# Patient Record
Sex: Female | Born: 1935 | Race: White | Hispanic: No | Smoking: Never smoker
Health system: Southern US, Community
[De-identification: ages and names within clinical notes are randomized; demographics above are authoritative.]

## PROBLEM LIST (undated history)

## (undated) DIAGNOSIS — M1990 Unspecified osteoarthritis, unspecified site: Secondary | ICD-10-CM

## (undated) DIAGNOSIS — E042 Nontoxic multinodular goiter: Secondary | ICD-10-CM

## (undated) DIAGNOSIS — I10 Essential (primary) hypertension: Secondary | ICD-10-CM

## (undated) DIAGNOSIS — E119 Type 2 diabetes mellitus without complications: Secondary | ICD-10-CM

## (undated) DIAGNOSIS — Z8673 Personal history of transient ischemic attack (TIA), and cerebral infarction without residual deficits: Secondary | ICD-10-CM

## (undated) DIAGNOSIS — C50919 Malignant neoplasm of unspecified site of unspecified female breast: Secondary | ICD-10-CM

## (undated) HISTORY — PX: BREAST BIOPSY: SHX20

## (undated) HISTORY — DX: Essential (primary) hypertension: I10

## (undated) HISTORY — DX: Malignant neoplasm of unspecified site of unspecified female breast: C50.919

## (undated) HISTORY — DX: Personal history of transient ischemic attack (TIA), and cerebral infarction without residual deficits: Z86.73

## (undated) HISTORY — DX: Unspecified osteoarthritis, unspecified site: M19.90

## (undated) HISTORY — PX: BIOPSY THYROID: PRO38

## (undated) HISTORY — DX: Type 2 diabetes mellitus without complications: E11.9

## (undated) HISTORY — DX: Nontoxic multinodular goiter: E04.2

## (undated) MED FILL — Dexamethasone Sodium Phosphate Inj 100 MG/10ML: INTRAMUSCULAR | Qty: 1 | Status: AC

## (undated) MED FILL — Heparin Sodium (Porcine) Lock Flush IV Soln 100 Unit/ML: INTRAVENOUS | Qty: 5 | Status: AC

## (undated) MED FILL — Sodium Chloride IV Soln 0.9%: INTRAVENOUS | Qty: 250 | Status: AC

---

## 2009-02-11 ENCOUNTER — Encounter: Admission: RE | Admit: 2009-02-11 | Discharge: 2009-02-11 | Payer: Self-pay | Admitting: Endocrinology

## 2014-01-30 DIAGNOSIS — M353 Polymyalgia rheumatica: Secondary | ICD-10-CM | POA: Diagnosis not present

## 2014-01-30 DIAGNOSIS — I10 Essential (primary) hypertension: Secondary | ICD-10-CM | POA: Diagnosis not present

## 2014-01-30 DIAGNOSIS — E1165 Type 2 diabetes mellitus with hyperglycemia: Secondary | ICD-10-CM | POA: Diagnosis not present

## 2014-01-30 DIAGNOSIS — E1169 Type 2 diabetes mellitus with other specified complication: Secondary | ICD-10-CM | POA: Diagnosis not present

## 2014-01-30 DIAGNOSIS — E780 Pure hypercholesterolemia: Secondary | ICD-10-CM | POA: Diagnosis not present

## 2014-03-25 DIAGNOSIS — M810 Age-related osteoporosis without current pathological fracture: Secondary | ICD-10-CM | POA: Diagnosis not present

## 2014-05-05 DIAGNOSIS — H2513 Age-related nuclear cataract, bilateral: Secondary | ICD-10-CM | POA: Diagnosis not present

## 2014-08-07 DIAGNOSIS — Z79899 Other long term (current) drug therapy: Secondary | ICD-10-CM | POA: Diagnosis not present

## 2014-08-07 DIAGNOSIS — Z9181 History of falling: Secondary | ICD-10-CM | POA: Diagnosis not present

## 2014-08-07 DIAGNOSIS — E780 Pure hypercholesterolemia: Secondary | ICD-10-CM | POA: Diagnosis not present

## 2014-08-07 DIAGNOSIS — Z0000 Encounter for general adult medical examination without abnormal findings: Secondary | ICD-10-CM | POA: Diagnosis not present

## 2014-08-07 DIAGNOSIS — Z1389 Encounter for screening for other disorder: Secondary | ICD-10-CM | POA: Diagnosis not present

## 2019-09-13 DIAGNOSIS — I340 Nonrheumatic mitral (valve) insufficiency: Secondary | ICD-10-CM

## 2019-09-13 DIAGNOSIS — I361 Nonrheumatic tricuspid (valve) insufficiency: Secondary | ICD-10-CM

## 2019-09-13 DIAGNOSIS — I6389 Other cerebral infarction: Secondary | ICD-10-CM

## 2020-01-03 DIAGNOSIS — Z171 Estrogen receptor negative status [ER-]: Secondary | ICD-10-CM | POA: Insufficient documentation

## 2020-01-08 ENCOUNTER — Telehealth: Payer: Self-pay | Admitting: Oncology

## 2020-01-08 ENCOUNTER — Other Ambulatory Visit: Payer: Self-pay | Admitting: Oncology

## 2020-01-08 DIAGNOSIS — Z170 Estrogen receptor positive status [ER+]: Secondary | ICD-10-CM | POA: Insufficient documentation

## 2020-01-08 DIAGNOSIS — C50411 Malignant neoplasm of upper-outer quadrant of right female breast: Secondary | ICD-10-CM | POA: Insufficient documentation

## 2020-01-08 DIAGNOSIS — C50911 Malignant neoplasm of unspecified site of right female breast: Secondary | ICD-10-CM | POA: Insufficient documentation

## 2020-01-08 NOTE — Telephone Encounter (Signed)
Patient referred by Dr Orrin Brigham for Rt Breast CA.  Appt Made for 01/09/20 Labs 2:30 pm - Consult 3:00 pm.  Patient notified

## 2020-01-09 ENCOUNTER — Inpatient Hospital Stay (INDEPENDENT_AMBULATORY_CARE_PROVIDER_SITE_OTHER): Payer: Medicare Other | Admitting: Oncology

## 2020-01-09 ENCOUNTER — Other Ambulatory Visit: Payer: Self-pay | Admitting: Oncology

## 2020-01-09 ENCOUNTER — Inpatient Hospital Stay: Payer: Medicare Other | Attending: Oncology

## 2020-01-09 ENCOUNTER — Other Ambulatory Visit: Payer: Self-pay

## 2020-01-09 ENCOUNTER — Other Ambulatory Visit: Payer: Self-pay | Admitting: Hematology and Oncology

## 2020-01-09 ENCOUNTER — Encounter: Payer: Self-pay | Admitting: Oncology

## 2020-01-09 DIAGNOSIS — C50411 Malignant neoplasm of upper-outer quadrant of right female breast: Secondary | ICD-10-CM | POA: Diagnosis not present

## 2020-01-09 DIAGNOSIS — Z170 Estrogen receptor positive status [ER+]: Secondary | ICD-10-CM

## 2020-01-09 LAB — HEPATIC FUNCTION PANEL
ALT: 23 (ref 7–35)
AST: 25 (ref 13–35)
Alkaline Phosphatase: 87 (ref 25–125)
Bilirubin, Total: 1.3

## 2020-01-09 LAB — CBC AND DIFFERENTIAL
HCT: 40 (ref 36–46)
Hemoglobin: 13 (ref 12.0–16.0)
Neutrophils Absolute: 4.05
Platelets: 192 (ref 150–399)
WBC: 5.7

## 2020-01-09 LAB — CBC: RBC: 4.16 (ref 3.87–5.11)

## 2020-01-09 LAB — BASIC METABOLIC PANEL
BUN: 16 (ref 4–21)
CO2: 26 — AB (ref 13–22)
Chloride: 102 (ref 99–108)
Creatinine: 0.8 (ref 0.5–1.1)
Glucose: 145
Potassium: 4.7 (ref 3.4–5.3)
Sodium: 140 (ref 137–147)

## 2020-01-09 LAB — COMPREHENSIVE METABOLIC PANEL
Albumin: 4.2 (ref 3.5–5.0)
Calcium: 10.5 (ref 8.7–10.7)

## 2020-01-09 NOTE — Progress Notes (Signed)
echo

## 2020-01-09 NOTE — Progress Notes (Signed)
[facility]  [address] [place],  [place]  [address] [phone]  Clinic Day:  01/09/2020  Referring physician: No ref. provider found   This document serves as a record of services personally performed by Hosie Poisson, MD. It was created on their behalf by Curry,Lauren E, a trained medical scribe. Brandy creation of this record is based on Brandy scribe's personal observations and Brandy provider's statements to them.   CHIEF COMPLAINT:  CC: Invasive ductal carcinoma of Brandy right breast  Current Treatment:  Will plan for neoadjuvant therapy   HISTORY OF PRESENT ILLNESS:  Brandy Jacobs is a 86 y.o. female referred by Dr. Orrin Brigham for Brandy evaluation and treatment of invasive ductal carcinoma of Brandy right breast.  This began when Brandy Jacobs felt a lump of Brandy right breast, which quickly enlarged.  Diagnostic mammogram confirmed a highly suspicious 2.9 cm upper right breast mass.  No abnormal right axillary lymph nodes were seen and left breast was negative.  Ultrasound guided biopsy was pursued and surgical pathology from that procedure confirmed invasive ductal carcinoma, grade 2/3, and DCIS with central necrosis.  HER2 was positive, estrogen receptor was positive at 95% and progesterone receptor was positive at 30%.  Ki67 was 40%.  INTERVAL HISTORY:  Brandy Jacobs states that Brandy Jacobs had a stroke back in late August, and still has some difficulty finding Brandy right words and if Brandy Jacobs gets excited, Brandy Jacobs will become forgetful.  Brandy Jacobs also has several nodules of Brandy thyroid, but biopsy was benign in November.  Brandy Jacobs does have diabetes, but is not on medication.  Brandy Jacobs does have a significant family history, but does think anyone has had genetic testing.  Blood counts and chemistries are unremarkable except for a calcium of 10.5.  Non-fasting blood glucose is 145.  Brandy Jacobs  appetite is good, and Brandy Jacobs is eating well.  Brandy Jacobs denies any significant weight loss or gain.  Brandy Jacobs denies fever,  chills or other signs of infection.  Brandy Jacobs denies nausea, vomiting, bowel issues, or abdominal pain.  Brandy Jacobs denies sore throat, cough, dyspnea, or chest pain.  Brandy Jacobs started menarche at age 34.  Brandy Jacobs has had 2 pregnancies, and had Brandy Jacobs first child at age 33.  Brandy Jacobs went through menopause in Brandy Jacobs late 86s and never took hormone replacement.  REVIEW OF SYSTEMS:  Review of Systems  Constitutional: Negative.   HENT:  Negative.   Eyes: Negative.   Respiratory: Negative.   Cardiovascular: Negative.   Gastrointestinal: Negative.   Endocrine: Negative.   Genitourinary: Negative.    Musculoskeletal: Negative.   Skin: Negative.   Neurological: Positive for speech difficulty (mild, residual from Brandy Jacobs prior stroke).       Occasional forgetfulness  Hematological: Negative.   Psychiatric/Behavioral: Negative.      VITALS:  Blood pressure (!) 158/69, pulse 77, temperature 98.1 F (36.7 C), temperature source Oral, resp. rate 18, height 5' 3.5" (1.613 m), weight 160 lb 8 oz (72.8 kg), SpO2 96 %.  Wt Readings from Last 3 Encounters:  01/09/20 160 lb 8 oz (72.8 kg)    Body mass index is 27.99 kg/m.  Performance status (ECOG): 1 - Symptomatic but completely ambulatory  PHYSICAL EXAM:  Physical Exam Constitutional:      General: Brandy Jacobs is not in acute distress.    Appearance: Normal appearance. Brandy Jacobs is normal weight.  HENT:     Head: Normocephalic and atraumatic.  Eyes:     General: No scleral icterus.    Extraocular  Movements: Extraocular movements intact.     Conjunctiva/sclera: Conjunctivae normal.     Pupils: Pupils are equal, round, and reactive to light.  Cardiovascular:     Rate and Rhythm: Normal rate and regular rhythm.     Pulses: Normal pulses.     Heart sounds: Normal heart sounds. No murmur heard. No friction rub. No gallop.   Pulmonary:     Effort: Pulmonary effort is normal. No respiratory distress.     Breath sounds: Normal breath sounds.  Chest:     Comments: I can feel a mass in Brandy  upper outer quadrant of Brandy right breast measuring 3-4 cm.  Abdominal:     General: Bowel sounds are normal. There is no distension.     Palpations: Abdomen is soft. There is no mass.     Tenderness: There is no abdominal tenderness.  Musculoskeletal:        General: Normal range of motion.     Cervical back: Normal range of motion and neck supple.     Right lower leg: Edema (trace) present.     Left lower leg: Edema (trace) present.  Lymphadenopathy:     Cervical: No cervical adenopathy.  Skin:    General: Skin is warm and dry.  Neurological:     General: No focal deficit present.     Mental Status: Brandy Jacobs is alert and oriented to person, place, and time. Mental status is at baseline.  Psychiatric:        Mood and Affect: Mood normal.        Behavior: Behavior normal.        Thought Content: Thought content normal.        Judgment: Judgment normal.     LABS:   CBC Latest Ref Rng & Units 01/09/2020  WBC - 5.7  Hemoglobin 12.0 - 16.0 13.0  Hematocrit 36 - 46 40  Platelets 150 - 399 192   CMP Latest Ref Rng & Units 01/09/2020  BUN 4 - 21 16  Creatinine 0.5 - 1.1 0.8  Sodium 137 - 147 140  Potassium 3.4 - 5.3 4.7  Chloride 99 - 108 102  CO2 13 - 22 26(A)  Calcium 8.7 - 10.7 10.5  Alkaline Phos 25 - 125 87  AST 13 - 35 25  ALT 7 - 35 23    STUDIES:   Brandy Jacobs underwent an MRI head without contrast on 09/14/2019 showing: 1.  Small acute infarct in Brandy left frontal cortex, MCA distribution 2.  Moderate chronic small vessel ischemia and small remote cerebellar infarcts  Brandy Jacobs underwent a thyroid ultrasound on 12/04/2019 showing: 1.  Multinodular goiter 2.  Solid nodule measuring up to 1.8 cm in Brandy mid right thyroid gland is highly suspicious for malignancy and meets criteria for tissue sampling.  Recommend ultrasound guided fine needle aspiration. 3.  Mixed cystic and solid nodulein Brandy right inferior thyroid gland is mildly suspicious for malignancy.  Recommend 1 year  ultrasound follow up. 4.  Additional scattered benign nodules throughout Brandy thyroid gland including a 1.8 cm spongiform nodule in Brandy left inferior thyroid. These nodules do not meet criteria for dedicated follow up.  Brandy Jacobs underwent an ultrasound guided fine needle aspiration of indeterminate thyroid nodule on 12/18/2019 and surgical pathology from this procedure revealed: 1.  Thyroid, fine needle aspiration, right-mid:  -  Consistent with benign follicular nodule, Bethesda category II.  Brandy Jacobs underwent digital diagnostic bilateral mammogram with tomography and right breast ultrasound on 12/26/2019 showing: breast density  category B.  Highly suspicious 2.9 cm upper right breast mass.  Tissue sampling is recommended.  No abnormal right axillary lymph nodes.  No mammographic evidence of left breast malignancy.  Brandy Jacobs underwent ultrasound guided right breast core needle biopsy on 01/01/2020 and surgical pathology from this procedure revealed: 1.  Breast, right, needle core biopsy, 11:30 oclock, 3 cmfn:  -  Invasive ductal carcinoma, grade 2/3, and DCIS with central necrosis  -  Tumor involves all cores, 8 mm in maximal linear extent HER2: Positive 3+ ER: Positive 95% PR: Positive 30% Ki67: 40%    HISTORY:   Past Medical History:  Diagnosis Date   Arthritis    osteoarthritis   Breast cancer ([facility])    Diabetes mellitus without complication ([place])    not on medication   History of CVA (cerebrovascular accident)    Hypertension    Multiple thyroid nodules    benign    Past Surgical History:  Procedure Laterality Date   BIOPSY THYROID     benign   BREAST BIOPSY      Family History  Problem Relation Age of Onset   Colon cancer Father        21s   Breast cancer Sister        30-60   Colon cancer Brother        16s   Prostate cancer Brother        late 13s   Breast cancer Half-Sister        late 79s early 28s    Social History:  reports that Brandy Jacobs has never smoked.  Brandy Jacobs has never used smokeless tobacco. Brandy Jacobs reports previous alcohol use. Brandy Jacobs reports that Brandy Jacobs does not use drugs.Brandy Jacobs is accompanied by Brandy Jacobs daughter Brandy Jacobs today.  Brandy Jacobs is widowed and lives at home alone.  Brandy Jacobs has 2 children, Brandy Jacobs daughter Brandy only one living.  Brandy Jacobs has been exposed to second hand smoke.  Allergies: Not on File  Current Medications: Current Outpatient Medications  Medication Sig Dispense Refill   acetaminophen (TYLENOL) 500 MG tablet Take 500 mg by mouth at bedtime.     aspirin EC 81 MG tablet Take 81 mg by mouth daily. Swallow whole.     atorvastatin (LIPITOR) 40 MG tablet Take 40 mg by mouth daily.     losartan (COZAAR) 50 MG tablet Take 50 mg by mouth daily.     predniSONE (DELTASONE) 5 MG tablet Take 2.5 mg by mouth 2 (two) times a week. TAKES 1/2 TABLET OF 5MG TWICE A WEEK     Vitamin D3 (VITAMIN D) 25 MCG tablet Take 1,000 Units by mouth daily.     No current facility-administered medications for this visit.     ASSESSMENT & PLAN:   Assessment:   1.  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  HER2 and hormone receptor positive.  Currently Brandy Jacobs mass measures 2.9 cm, so we discussed neoadjuvant chemotherapy options today.  2.  History of stroke in late August 2021.  Brandy Jacobs only has residual difficulty with finding Brandy correct words when speaking, and minor short term memory loss when Brandy Jacobs becomes excitable.    3.  Multiple thyroid nodules, benign on biopsy from November 2021.  4.  Chronically elevated calcium, mild.  Brandy Jacobs states that this fluctuates up and down, and Brandy Jacobs has been evaluated by an endocrinologist.  Plan: This is a fairly healthy 86 year old female recently diagnosed with stage II triple positive  invasive ductal carcinoma of Brandy right breast.  Brandy Jacobs has been seen by Dr. Lilia Pro for consultation to discuss lumpectomy versus mastectomy at this time, and he has consulted me to determine if it would be better to  pursue neoadjuvant or adjuvant therapy.  With Brandy size of Brandy Jacobs tumor, I feel neoadjuvant therapy is indicated and might allow for a better resection by lumpectomy.  Normally, we would do a pretreatment MRI but I don't know if this is necessary as Brandy lesion is easily palpable and Brandy Jacobs would have some musculoskeletal difficulties tolerating Brandy position required.  As Brandy Jacobs is HER2 positive, I do recommend chemotherapy with Taxol IV weekly for 9 doses and Herceptin IV every 3 weeks for a total of 1 year.  I would likely add in Perjeta as well as long as Brandy Jacobs cardiac function is adequate. Brandy Jacobs then could proceed with lumpectomy after completing Taxol.  We reviewed Brandy schedule and potential toxicities of treatment and would schedule Brandy Jacobs for an education session prior to Brandy Jacobs start date.  Brandy Jacobs would also need to undergo port placement, which could be scheduled through Dr. Lilia Pro.  Baseline ECHO would also need to be obtained prior to initiation.  Following Brandy completion of systemic therapy, we could initiate hormonal therapy with an aromatase inhibitor such as anastrozole for a total of 5 years.  Brandy Jacobs is in agreement to proceed with neoadjuvant therapy, so we will try and get treatment started in early January.  Brandy Jacobs and Brandy Jacobs daughter understand and agree with this plan of care.  I have answered Brandy Jacobs questions and Brandy Jacobs knows to call with any concerns.  Thank you for Brandy opportunity    I provided 60 minutes of face-to-face time during this this encounter and > 50% was spent counseling as documented under my assessment and plan.    Derwood Kaplan, MD [facility] [address] [place] [address] Dept: [phone] Dept Fax: [phone]   I, Rita Ohara, am acting as scribe for Derwood Kaplan, MD  I have reviewed this report as typed by Brandy medical scribe, and it is complete and accurate.

## 2020-01-10 ENCOUNTER — Telehealth: Payer: Self-pay | Admitting: Oncology

## 2020-01-10 ENCOUNTER — Telehealth: Payer: Self-pay

## 2020-01-10 NOTE — Telephone Encounter (Signed)
Called Pam at Dr. Maxie Barb office. Per Pam, have patient to come by office to sign papers for port placement, no appointment necessary due to patient just saw him. Patient will be called and notified of the above. Tried to call daughter Marcelline Mates notified.

## 2020-01-10 NOTE — Telephone Encounter (Signed)
Per 12/16 No LOS entered.  Do we go ahead and scheduled patient's 1st treatment on 01/28/2020

## 2020-01-10 NOTE — Telephone Encounter (Signed)
-----   Message from Derwood Kaplan, MD sent at 01/09/2020  8:19 PM EST ----- Regarding: refer Ask Dr. Lilia Pro to place port, he will be expecting call, he sent her to me

## 2020-01-10 NOTE — Telephone Encounter (Signed)
Pam from Dr. Maxie Barb office called to let us know that patient is scheduled for port placement on 01/16/20. Dr. Hinton Rao made aware.

## 2020-01-14 ENCOUNTER — Telehealth: Payer: Self-pay | Admitting: Oncology

## 2020-01-14 NOTE — Telephone Encounter (Signed)
12/21 spoke with daughter -echo sched on 01/20/20@1pm 

## 2020-01-19 ENCOUNTER — Other Ambulatory Visit: Payer: Self-pay | Admitting: Oncology

## 2020-01-19 DIAGNOSIS — Z170 Estrogen receptor positive status [ER+]: Secondary | ICD-10-CM

## 2020-01-19 DIAGNOSIS — C50411 Malignant neoplasm of upper-outer quadrant of right female breast: Secondary | ICD-10-CM

## 2020-01-20 ENCOUNTER — Telehealth: Payer: Self-pay

## 2020-01-20 DIAGNOSIS — I361 Nonrheumatic tricuspid (valve) insufficiency: Secondary | ICD-10-CM

## 2020-01-20 DIAGNOSIS — I340 Nonrheumatic mitral (valve) insufficiency: Secondary | ICD-10-CM

## 2020-01-20 NOTE — Telephone Encounter (Signed)
Brandy Jacobs from Dr. Maxie Barb office called to inform us that patient port surgery was rescheduled due to surgeon being sick. Rescheduled port placement to 01/27/2020. Dr. Hinton Rao also made aware of change.

## 2020-01-22 ENCOUNTER — Encounter: Payer: Self-pay | Admitting: Oncology

## 2020-01-22 NOTE — Progress Notes (Deleted)
[facility]  [address] [place],  [name]  [address] [phone]  Clinic Day:  01/22/2020  Referring physician: [name], MD   This document serves as a record of services personally performed by Gery Pray, MD. It was created on their behalf by [facility] E, a trained medical scribe. The creation of this record is based on the scribe's personal observations and the provider's statements to them.   CHIEF COMPLAINT:  CC: Invasive ductal carcinoma of the right breast  Current Treatment:  Will plan for neoadjuvant therapy   HISTORY OF PRESENT ILLNESS:  Brandy Jacobs is a 86 y.o. female referred by Dr. Marvis Moeller for the evaluation and treatment of invasive ductal carcinoma of the right breast.  This began when the patient felt a lump of the right breast, which quickly enlarged.  Diagnostic mammogram confirmed a highly suspicious 2.9 cm upper right breast mass.  No abnormal right axillary lymph nodes were seen and left breast was negative.  Ultrasound guided biopsy was pursued and surgical pathology from that procedure confirmed invasive ductal carcinoma, grade 2/3, and DCIS with central necrosis.  HER2 was positive, estrogen receptor was positive at 95% and progesterone receptor was positive at 30%.  Ki67 was 40%.  INTERVAL HISTORY:  Brandy Jacobs states that she had a stroke back in late August, and still has some difficulty finding the right words and if she gets excited, she will become forgetful.  She also has several nodules of the thyroid, but biopsy was benign in November.  She does have diabetes, but is not on medication.  She does have a significant family history, but does think anyone has had genetic testing.  Blood counts and chemistries are unremarkable except for a calcium of 10.5.  Non-fasting blood glucose is 145.  Her  appetite is good, and she is eating well.  She denies any significant weight loss or gain.  She denies fever, chills  or other signs of infection.  She denies nausea, vomiting, bowel issues, or abdominal pain.  She denies sore throat, cough, dyspnea, or chest pain.  She started menarche at age 64.  She has had 2 pregnancies, and had her first child at age 69.  She went through menopause in her late 17s and never took hormone replacement.  REVIEW OF SYSTEMS:  Review of Systems - Oncology   VITALS:  There were no vitals taken for this visit.  Wt Readings from Last 3 Encounters:  01/09/20 160 lb 8 oz (72.8 kg)    There is no height or weight on file to calculate BMI.  Performance status (ECOG): 1 - Symptomatic but completely ambulatory  PHYSICAL EXAM:  Physical Exam  LABS:   CBC Latest Ref Rng & Units 01/09/2020  WBC - 5.7  Hemoglobin 12.0 - 16.0 13.0  Hematocrit 36 - 46 40  Platelets 150 - 399 192   CMP Latest Ref Rng & Units 01/09/2020  BUN 4 - 21 16  Creatinine 0.5 - 1.1 0.8  Sodium 137 - 147 140  Potassium 3.4 - 5.3 4.7  Chloride 99 - 108 102  CO2 13 - 22 26(A)  Calcium 8.7 - 10.7 10.5  Alkaline Phos 25 - 125 87  AST 13 - 35 25  ALT 7 - 35 23    STUDIES:   She underwent an MRI head without contrast on 09/14/2019 showing: 1.  Small acute infarct in the left frontal cortex, MCA distribution 2.  Moderate chronic small vessel  ischemia and small remote cerebellar infarcts  She underwent a thyroid ultrasound on 12/04/2019 showing: 1.  Multinodular goiter 2.  Solid nodule measuring up to 1.8 cm in the mid right thyroid gland is highly suspicious for malignancy and meets criteria for tissue sampling.  Recommend ultrasound guided fine needle aspiration. 3.  Mixed cystic and solid nodulein the right inferior thyroid gland is mildly suspicious for malignancy.  Recommend 1 year ultrasound follow up. 4.  Additional scattered benign nodules throughout the thyroid gland including a 1.8 cm spongiform nodule in the left inferior thyroid. These nodules do not meet criteria for dedicated follow  up.  She underwent an ultrasound guided fine needle aspiration of indeterminate thyroid nodule on 12/18/2019 and surgical pathology from this procedure revealed: 1.  Thyroid, fine needle aspiration, right-mid:  -  Consistent with benign follicular nodule, Bethesda category II.  She underwent digital diagnostic bilateral mammogram with tomography and right breast ultrasound on 12/26/2019 showing: breast density category B.  Highly suspicious 2.9 cm upper right breast mass.  Tissue sampling is recommended.  No abnormal right axillary lymph nodes.  No mammographic evidence of left breast malignancy.  She underwent ultrasound guided right breast core needle biopsy on 01/01/2020 and surgical pathology from this procedure revealed: 1.  Breast, right, needle core biopsy, 11:30 o'clock, 3 cmfn:  -  Invasive ductal carcinoma, grade 2/3, and DCIS with central necrosis  -  Tumor involves all cores, 8 mm in maximal linear extent HER2: Positive 3+ ER: Positive 95% PR: Positive 30% Ki67: 40%    HISTORY:   Past Medical History:  Diagnosis Date  . Arthritis    osteoarthritis  . Breast cancer ([place])   . Diabetes mellitus without complication ([place])    not on medication  . History of CVA (cerebrovascular accident)   . Hypertension   . Multiple thyroid nodules    benign    Past Surgical History:  Procedure Laterality Date  . BIOPSY THYROID     benign  . BREAST BIOPSY      Family History  Problem Relation Age of Onset  . Colon cancer Father        61s  . Breast cancer Sister        62-60  . Colon cancer Brother        46s  . Prostate cancer Brother        late 22s  . Breast cancer Half-Sister        late 44s early 52s    Social History:  reports that she has never smoked. She has never used smokeless tobacco. She reports previous alcohol use. She reports that she does not use drugs.The patient is accompanied by her daughter Brandy Jacobs today.  She is widowed and lives at home alone.  She  has 2 children, her daughter the only one living.  She has been exposed to second hand smoke.  Allergies: No Known Allergies  Current Medications: Current Outpatient Medications  Medication Sig Dispense Refill  . acetaminophen (TYLENOL) 500 MG tablet Take 500 mg by mouth at bedtime.    Marland Kitchen aspirin EC 81 MG tablet Take 81 mg by mouth daily. Swallow whole.    Marland Kitchen atorvastatin (LIPITOR) 40 MG tablet Take 40 mg by mouth daily.    Marland Kitchen losartan (COZAAR) 50 MG tablet Take 50 mg by mouth daily.    . predniSONE (DELTASONE) 5 MG tablet Take 2.5 mg by mouth 2 (two) times a week. TAKES 1/2 TABLET OF $RemoveB'5MG'zWNopOvt$  TWICE A  WEEK    . Vitamin D3 (VITAMIN D) 25 MCG tablet Take 1,000 Units by mouth daily.     No current facility-administered medications for this visit.     ASSESSMENT & PLAN:   Assessment:   1.  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  HER2 and hormone receptor positive.  Currently her mass measures 2.9 cm, so we discussed neoadjuvant chemotherapy options today.  2.  History of stroke in late August 2021.  She only has residual difficulty with finding the correct words when speaking, and minor short term memory loss when she becomes excitable.    3.  Multiple thyroid nodules, benign on biopsy from November 2021.  4.  Chronically elevated calcium, mild.  She states that this fluctuates up and down, and she has been evaluated by an endocrinologist.  Plan: This is a fairly healthy 86 year old female recently diagnosed with stage II triple positive invasive ductal carcinoma of the right breast.  She has been seen by Dr. Lilia Pro for consultation to discuss lumpectomy versus mastectomy at this time, and he has consulted me to determine if it would be better to pursue neoadjuvant or adjuvant therapy.  With the size of her tumor, I feel neoadjuvant therapy is indicated and might allow for a better resection by lumpectomy.  Normally, we would do a pretreatment MRI but I  don't know if this is necessary as the lesion is easily palpable and she would have some musculoskeletal difficulties tolerating the position required.  As she is HER2 positive, I do recommend chemotherapy with Taxol IV weekly for 9 doses and Herceptin IV every 3 weeks for a total of 1 year.  I would likely add in Perjeta as well as long as her cardiac function is adequate. She then could proceed with lumpectomy after completing Taxol.  We reviewed the schedule and potential toxicities of treatment and would schedule her for an education session prior to her start date.  She would also need to undergo port placement, which could be scheduled through Dr. Lilia Pro.  Baseline ECHO would also need to be obtained prior to initiation.  Following the completion of systemic therapy, we could initiate hormonal therapy with an aromatase inhibitor such as anastrozole for a total of 5 years.  She is in agreement to proceed with neoadjuvant therapy, so we will try and get treatment started in early January.  She and her daughter understand and agree with this plan of care.  I have answered her questions and she knows to call with any concerns.  Thank you for the opportunity    I provided 60 minutes of face-to-face time during this this encounter and > 50% was spent counseling as documented under my assessment and plan.    Melodye Ped, NP [facility] [address] [place] [address] Dept: [phone] Dept Fax: [phone]   I, Rita Ohara, am acting as scribe for Derwood Kaplan, MD  I have reviewed this report as typed by the medical scribe, and it is complete and accurate.

## 2020-01-23 ENCOUNTER — Inpatient Hospital Stay: Payer: Medicare Other

## 2020-01-23 ENCOUNTER — Other Ambulatory Visit: Payer: Self-pay

## 2020-01-23 ENCOUNTER — Inpatient Hospital Stay: Payer: Medicare Other | Admitting: Hematology and Oncology

## 2020-01-23 ENCOUNTER — Other Ambulatory Visit: Payer: Self-pay | Admitting: Hematology and Oncology

## 2020-01-23 ENCOUNTER — Other Ambulatory Visit: Payer: Self-pay | Admitting: Oncology

## 2020-01-23 ENCOUNTER — Telehealth: Payer: Self-pay | Admitting: Hematology and Oncology

## 2020-01-23 VITALS — BP 152/67 | HR 62 | Temp 98.4°F | Resp 16 | Ht 63.5 in

## 2020-01-23 DIAGNOSIS — Z170 Estrogen receptor positive status [ER+]: Secondary | ICD-10-CM

## 2020-01-23 DIAGNOSIS — C50411 Malignant neoplasm of upper-outer quadrant of right female breast: Secondary | ICD-10-CM

## 2020-01-23 LAB — BASIC METABOLIC PANEL
BUN: 18 (ref 4–21)
CO2: 29 — AB (ref 13–22)
Chloride: 109 — AB (ref 99–108)
Creatinine: 0.9 (ref 0.5–1.1)
Glucose: 132
Potassium: 3.8 (ref 3.4–5.3)
Sodium: 142 (ref 137–147)

## 2020-01-23 LAB — CBC AND DIFFERENTIAL
HCT: 37 (ref 36–46)
Hemoglobin: 12.2 (ref 12.0–16.0)
Neutrophils Absolute: 3.3
Platelets: 178 (ref 150–399)
WBC: 5.5

## 2020-01-23 LAB — HEPATIC FUNCTION PANEL
ALT: 22 (ref 7–35)
AST: 24 (ref 13–35)
Alkaline Phosphatase: 75 (ref 25–125)
Bilirubin, Total: 1.3

## 2020-01-23 LAB — CBC: RBC: 3.88 (ref 3.87–5.11)

## 2020-01-23 LAB — COMPREHENSIVE METABOLIC PANEL
Albumin: 4 (ref 3.5–5.0)
Calcium: 10.1 (ref 8.7–10.7)

## 2020-01-23 MED ORDER — DEXAMETHASONE 4 MG PO TABS: 4.0000 mg | ORAL_TABLET | ORAL | 2 refills | Status: DC

## 2020-01-23 MED ORDER — ONDANSETRON HCL 4 MG PO TABS: 4.0000 mg | ORAL_TABLET | ORAL | 5 refills | Status: DC | PRN

## 2020-01-23 MED ORDER — PROCHLORPERAZINE MALEATE 10 MG PO TABS: 10.0000 mg | ORAL_TABLET | Freq: Four times a day (QID) | ORAL | 5 refills | Status: DC | PRN

## 2020-01-23 NOTE — Telephone Encounter (Signed)
01/23/20 next appt given to patient

## 2020-01-23 NOTE — Progress Notes (Signed)
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  HER2 and hormone receptor positive .  Patient presents to clinic today with her daughter for chemotherapy education and palliative care consult.  We will start weekly paclitaxel with herceptin and perjeta.  We will send in prescriptions for prochlorperazine and ondansetron.  The patient verbalizes understanding of and agreement to the plan as discussed today.  Provided general information including the following: 1.  Date of education: 01-23-2020 2.  Physician name: Dr. Hinton Rao 3.  Diagnosis: Invasive ductal carcinoma and ductal carcinoma in situ of the right breast 4.  Stage: Stage II 5.  Curative  6.  Chemotherapy plan including drugs and how often: Weekly paclitaxel x 9 cycles. Herceptin/ Perjeta x 1 year. 7.  Start date: 01-28-2020 8.  Other referrals: No other referrals at this time 9.  The patient is to call our office with any questions or concerns.  Our office number [phone], if after hours or on the weekend, call the same number and wait for the answering service.  There is always an oncologist on call 10.  Medications prescribed: Dexamethasone, zofran and compazine 11.  The patient has verbalized understanding of the treatment plan and has no barriers to adherence or understanding.  Obtained signed consent from patient.  Discussed symptoms including 1.  Low blood counts including red blood cells, white blood cells and platelets. 2. Infection including to avoid large crowds, wash hands frequently, and stay away from people who were sick.  If fever develops of 100.4 or higher, call our office. 3.  Mucositis-given instructions on mouth rinse (baking soda and salt mixture).  Keep mouth clean.  Use soft bristle toothbrush.  If mouth sores develop, call our clinic. 4.  Nausea/vomiting-gave prescriptions for ondansetron 4 mg every 4 hours as  needed for nausea, may take around the clock if persistent.  Compazine 10 mg every 6 hours, may take around the clock if persistent. 5.  Diarrhea-use over-the-counter Imodium.  Call clinic if not controlled. 6.  Constipation-use senna, 1 to 2 tablets twice a day.  If no BM in 2 to 3 days call the clinic. 7.  Loss of appetite-try to eat small meals every 2-3 hours.  Call clinic if not eating. 8.  Taste changes-zinc 500 mg daily.  If becomes severe call clinic. 9.  Alcoholic beverages. 10.  Drink 2 to 3 quarts of water per day. 11.  Peripheral neuropathy-patient to call if numbness or tingling in hands or feet is persistent   Gave information on the supportive care team and how to contact them regarding services.  Discussed advanced directives.  The patient does not have their advanced directives but will look at the copy provided in their notebook and will call with any questions. [employer] Social worker Advanced directives  Answered questions to patient satisfaction.  Patient is to call with any further questions or concerns.  Time spent on this palliative care/chemotherapy education was 60 minutes with more than 50% spent discussing diagnosis, prognosis and symptom management.

## 2020-01-27 ENCOUNTER — Encounter: Payer: Self-pay | Admitting: Oncology

## 2020-01-28 ENCOUNTER — Other Ambulatory Visit: Payer: Self-pay

## 2020-01-28 ENCOUNTER — Inpatient Hospital Stay: Payer: Medicare Other | Attending: Oncology

## 2020-01-28 VITALS — BP 166/76 | HR 51 | Temp 98.0°F | Resp 18 | Ht 63.5 in | Wt 163.2 lb

## 2020-01-28 DIAGNOSIS — E042 Nontoxic multinodular goiter: Secondary | ICD-10-CM | POA: Diagnosis not present

## 2020-01-28 DIAGNOSIS — Z7982 Long term (current) use of aspirin: Secondary | ICD-10-CM | POA: Insufficient documentation

## 2020-01-28 DIAGNOSIS — D0511 Intraductal carcinoma in situ of right breast: Secondary | ICD-10-CM | POA: Diagnosis present

## 2020-01-28 DIAGNOSIS — M1990 Unspecified osteoarthritis, unspecified site: Secondary | ICD-10-CM | POA: Insufficient documentation

## 2020-01-28 DIAGNOSIS — Z170 Estrogen receptor positive status [ER+]: Secondary | ICD-10-CM | POA: Insufficient documentation

## 2020-01-28 DIAGNOSIS — Z8673 Personal history of transient ischemic attack (TIA), and cerebral infarction without residual deficits: Secondary | ICD-10-CM | POA: Diagnosis not present

## 2020-01-28 DIAGNOSIS — E119 Type 2 diabetes mellitus without complications: Secondary | ICD-10-CM | POA: Insufficient documentation

## 2020-01-28 DIAGNOSIS — C50411 Malignant neoplasm of upper-outer quadrant of right female breast: Secondary | ICD-10-CM

## 2020-01-28 DIAGNOSIS — Z5111 Encounter for antineoplastic chemotherapy: Secondary | ICD-10-CM | POA: Insufficient documentation

## 2020-01-28 DIAGNOSIS — I10 Essential (primary) hypertension: Secondary | ICD-10-CM | POA: Insufficient documentation

## 2020-01-28 DIAGNOSIS — Z79899 Other long term (current) drug therapy: Secondary | ICD-10-CM | POA: Diagnosis not present

## 2020-01-28 MED ORDER — TRASTUZUMAB-DKST CHEMO 150 MG IV SOLR
8.0000 mg/kg | Freq: Once | INTRAVENOUS | Status: AC
  Administered 2020-01-28: 588 mg via INTRAVENOUS
  Filled 2020-01-28: qty 28

## 2020-01-28 MED ORDER — SODIUM CHLORIDE 0.9 % IV SOLN
Freq: Once | INTRAVENOUS | Status: AC
  Filled 2020-01-28: qty 250

## 2020-01-28 MED ORDER — FAMOTIDINE IN NACL 20-0.9 MG/50ML-% IV SOLN
INTRAVENOUS | Status: AC
  Filled 2020-01-28: qty 50

## 2020-01-28 MED ORDER — SODIUM CHLORIDE 0.9 % IV SOLN
840.0000 mg | Freq: Once | INTRAVENOUS | Status: AC
  Administered 2020-01-28: 840 mg via INTRAVENOUS
  Filled 2020-01-28: qty 28

## 2020-01-28 MED ORDER — SODIUM CHLORIDE 0.9 % IV SOLN
10.0000 mg | Freq: Once | INTRAVENOUS | Status: AC
  Administered 2020-01-28: 10 mg via INTRAVENOUS
  Filled 2020-01-28: qty 10

## 2020-01-28 MED ORDER — ACETAMINOPHEN 325 MG PO TABS: 650.0000 mg | ORAL_TABLET | Freq: Once | ORAL | Status: DC

## 2020-01-28 MED ORDER — FAMOTIDINE IN NACL 20-0.9 MG/50ML-% IV SOLN
20.0000 mg | Freq: Once | INTRAVENOUS | Status: AC
  Administered 2020-01-28: 20 mg via INTRAVENOUS

## 2020-01-28 MED ORDER — HEPARIN SOD (PORK) LOCK FLUSH 100 UNIT/ML IV SOLN
500.0000 [IU] | Freq: Once | INTRAVENOUS | Status: AC | PRN
  Administered 2020-01-28: 500 [IU]
  Filled 2020-01-28: qty 5

## 2020-01-28 MED ORDER — DIPHENHYDRAMINE HCL 50 MG/ML IJ SOLN
25.0000 mg | Freq: Once | INTRAMUSCULAR | Status: AC
Start: 2020-01-28 — End: 2020-01-28
  Administered 2020-01-28: 25 mg via INTRAVENOUS

## 2020-01-28 MED ORDER — SODIUM CHLORIDE 0.9 % IV SOLN
62.4000 mg/m2 | Freq: Once | INTRAVENOUS | Status: AC
  Administered 2020-01-28: 114 mg via INTRAVENOUS
  Filled 2020-01-28: qty 19

## 2020-01-28 MED ORDER — DIPHENHYDRAMINE HCL 50 MG/ML IJ SOLN
INTRAMUSCULAR | Status: AC
  Filled 2020-01-28: qty 1

## 2020-01-28 MED ORDER — ACETAMINOPHEN 325 MG PO TABS
ORAL_TABLET | ORAL | Status: AC
  Filled 2020-01-28: qty 2

## 2020-01-28 NOTE — Patient Instructions (Signed)
[facility] Discharge Instructions for Patients Receiving Chemotherapy  Today you received the following chemotherapy agents trastuzumab-pertuzumab-paclitaxel  To help prevent nausea and vomiting after your treatment, we encourage you to take your nausea medication.   If you develop nausea and vomiting that is not controlled by your nausea medication, call the clinic.   BELOW ARE SYMPTOMS THAT SHOULD BE REPORTED IMMEDIATELY:  *FEVER GREATER THAN 100.5 F  *CHILLS WITH OR WITHOUT FEVER  NAUSEA AND VOMITING THAT IS NOT CONTROLLED WITH YOUR NAUSEA MEDICATION  *UNUSUAL SHORTNESS OF BREATH  *UNUSUAL BRUISING OR BLEEDING  TENDERNESS IN MOUTH AND THROAT WITH OR WITHOUT PRESENCE OF ULCERS  *URINARY PROBLEMS  *BOWEL PROBLEMS  UNUSUAL RASH Items with * indicate a potential emergency and should be followed up as soon as possible.  Feel free to call the clinic should you have any questions or concerns at The clinic phone number is [phone].  Please show the CHEMO ALERT CARD at check-in to the Emergency Department and triage nurse.

## 2020-01-29 ENCOUNTER — Other Ambulatory Visit: Payer: Self-pay | Admitting: Oncology

## 2020-01-29 ENCOUNTER — Telehealth: Payer: Self-pay

## 2020-01-29 NOTE — Progress Notes (Signed)
Called and spoke with Valentino Saxon, patient daughter, about co-pay assistance. The Breast Cancer fund is open through [employer]. They were ok with me applying, the application is pending review. Told them I would call back once I know if she is approved or not.

## 2020-01-29 NOTE — Telephone Encounter (Signed)
I spoke with pt's daughter,Cherry. She states her mom did well during the night and today. Pt didn't have any N/V, diarrhea, or skin rashes. I emphasized the importance of calling the cancer center if pt develops temp over 100.4 day/night. Cherry verbalized understanding, confirmed next appt as well. [phone]

## 2020-01-30 ENCOUNTER — Other Ambulatory Visit: Payer: Self-pay | Admitting: Surgery

## 2020-01-30 DIAGNOSIS — R928 Other abnormal and inconclusive findings on diagnostic imaging of breast: Secondary | ICD-10-CM

## 2020-02-03 ENCOUNTER — Other Ambulatory Visit: Payer: Self-pay | Admitting: Pharmacist

## 2020-02-03 ENCOUNTER — Inpatient Hospital Stay (INDEPENDENT_AMBULATORY_CARE_PROVIDER_SITE_OTHER): Payer: Medicare Other | Admitting: Hematology and Oncology

## 2020-02-03 ENCOUNTER — Telehealth: Payer: Self-pay | Admitting: Oncology

## 2020-02-03 ENCOUNTER — Inpatient Hospital Stay: Payer: Medicare Other

## 2020-02-03 ENCOUNTER — Other Ambulatory Visit: Payer: Self-pay

## 2020-02-03 VITALS — BP 125/58 | HR 69 | Temp 98.0°F | Resp 16 | Ht 63.5 in | Wt 160.4 lb

## 2020-02-03 DIAGNOSIS — Z170 Estrogen receptor positive status [ER+]: Secondary | ICD-10-CM

## 2020-02-03 DIAGNOSIS — C50411 Malignant neoplasm of upper-outer quadrant of right female breast: Secondary | ICD-10-CM

## 2020-02-03 LAB — HEPATIC FUNCTION PANEL
ALT: 18 (ref 7–35)
AST: 17 (ref 13–35)
Alkaline Phosphatase: 80 (ref 25–125)
Bilirubin, Total: 2.5

## 2020-02-03 LAB — CBC: RBC: 3.66 — AB (ref 3.87–5.11)

## 2020-02-03 LAB — CBC AND DIFFERENTIAL
HCT: 35 — AB (ref 36–46)
Hemoglobin: 11.6 — AB (ref 12.0–16.0)
Neutrophils Absolute: 6.02
Platelets: 172 (ref 150–399)
WBC: 7

## 2020-02-03 LAB — BASIC METABOLIC PANEL
BUN: 19 (ref 4–21)
CO2: 24 — AB (ref 13–22)
Chloride: 106 (ref 99–108)
Creatinine: 0.8 (ref 0.5–1.1)
Glucose: 156
Potassium: 3.8 (ref 3.4–5.3)
Sodium: 136 — AB (ref 137–147)

## 2020-02-03 LAB — COMPREHENSIVE METABOLIC PANEL
Albumin: 3.8 (ref 3.5–5.0)
Calcium: 9.6 (ref 8.7–10.7)

## 2020-02-03 NOTE — Telephone Encounter (Signed)
Per 1/10 los next appt sched 

## 2020-02-03 NOTE — Progress Notes (Signed)
[facility]  [address] [place],  [place]  [address] [phone]  Clinic Day:  02/04/2020  Referring physician: Guadalupe Maple, MD     CHIEF COMPLAINT:  CC: Invasive ductal carcinoma of the right breast  Current Treatment:  Weekly paclitaxel x 9 weeks with Herceptin/ Perjeta every 3 weeks.   HISTORY OF PRESENT ILLNESS:  Brandy Jacobs is a 87 y.o. female referred by Dr. Orrin Brigham for the evaluation and treatment of invasive ductal carcinoma of the right breast.  This began when the patient felt a lump of the right breast, which quickly enlarged.  Diagnostic mammogram confirmed a highly suspicious 2.9 cm upper right breast mass.  No abnormal right axillary lymph nodes were seen and left breast was negative.  Ultrasound guided biopsy was pursued and surgical pathology from that procedure confirmed invasive ductal carcinoma, grade 2/3, and DCIS with central necrosis.  HER2 was positive, estrogen receptor was positive at 95% and progesterone receptor was positive at 30%.  Ki67 was 40%.  Normally, we would do a pretreatment MRI but we don't know if this is necessary as the lesion is easily palpable and she would have some musculoskeletal difficulties tolerating the position required.  As she is HER2 positive, we do recommend chemotherapy with Taxol IV weekly for 9 doses and Herceptin/ Perjeta IV every 3 weeks for a total of 1 year. She received her first cycle on 01-28-2020.  Brandy Jacobs states that she had a stroke back in late August, and still has some difficulty finding the right words and if she gets excited, she will become forgetful.  She also has several nodules of the thyroid, but biopsy was benign in November.  She does have diabetes, but is not on medication.   INTERVAL HISTORY:  Brandy Jacobs is here today for follow up after her first cycle of paclitaxel/ trastuzumab/ pertuzumab last week. She reports tolerating this fairly well. She has had diarrhea since  last night and is now taking imodium. She reports her appetite is decreased, although her weight is stable today. She denies fever, chills, nausea or vomiting. She denies shortness of breath, cough or chest pain. CBC today is unremarkable; however, CMP today reveals total bilirubin 2.5 increased from 1.3 before starting therapy. She will undergo stereotactic breast biopsy with Dr. Lilia Pro on Wednesday. Both the patient and her daughter state they spoke with Dr. Lilia Pro and understand the biopsy will help determine future surgical decisions.   REVIEW OF SYSTEMS:  Review of Systems  Constitutional: Positive for appetite change. Negative for chills, diaphoresis, fatigue, fever and unexpected weight change.  HENT:   Negative for hearing loss, lump/mass, mouth sores, nosebleeds, sore throat, tinnitus, trouble swallowing and voice change.   Eyes: Negative for eye problems and icterus.  Respiratory: Negative for chest tightness, cough, hemoptysis, shortness of breath and wheezing.   Cardiovascular: Negative for chest pain, leg swelling and palpitations.  Gastrointestinal: Positive for diarrhea. Negative for abdominal distention, abdominal pain, blood in stool, constipation, nausea, rectal pain and vomiting.  Endocrine: Negative for hot flashes.  Genitourinary: Negative for bladder incontinence, difficulty urinating, dyspareunia, dysuria, frequency, hematuria and nocturia.   Musculoskeletal: Negative for arthralgias, back pain, flank pain, gait problem, myalgias, neck pain and neck stiffness.  Skin: Negative for itching, rash and wound.  Neurological: Negative for dizziness, extremity weakness, gait problem, headaches, light-headedness, numbness, seizures and speech difficulty.  Hematological: Negative for adenopathy. Does not bruise/bleed easily.  Psychiatric/Behavioral: Negative for confusion, decreased concentration, depression, sleep  disturbance and suicidal ideas. The patient is not nervous/anxious.       VITALS:  Blood pressure (!) 125/58, pulse 69, temperature 98 F (36.7 C), temperature source Oral, resp. rate 16, height 5' 3.5" (1.613 m), weight 160 lb 6.4 oz (72.8 kg), SpO2 95 %.  Wt Readings from Last 3 Encounters:  02/03/20 160 lb 6.4 oz (72.8 kg)  01/28/20 163 lb 4 oz (74 kg)  01/09/20 160 lb 8 oz (72.8 kg)    Body mass index is 27.97 kg/m.  Performance status (ECOG): 1 - Symptomatic but completely ambulatory  PHYSICAL EXAM:  Physical Exam Constitutional:      General: She is not in acute distress.    Appearance: Normal appearance. She is normal weight. She is not ill-appearing, toxic-appearing or diaphoretic.  HENT:     Head: Normocephalic and atraumatic.     Right Ear: Tympanic membrane normal.     Left Ear: Tympanic membrane normal.     Nose: Nose normal. No congestion or rhinorrhea.     Mouth/Throat:     Mouth: Mucous membranes are moist.     Pharynx: Oropharynx is clear. No oropharyngeal exudate or posterior oropharyngeal erythema.  Eyes:     General: No scleral icterus.       Right eye: No discharge.        Left eye: No discharge.     Extraocular Movements: Extraocular movements intact.     Conjunctiva/sclera: Conjunctivae normal.     Pupils: Pupils are equal, round, and reactive to light.  Neck:     Vascular: No carotid bruit.  Cardiovascular:     Rate and Rhythm: Normal rate and regular rhythm.     Heart sounds: No murmur heard. No friction rub. No gallop.   Pulmonary:     Effort: Pulmonary effort is normal. No respiratory distress.     Breath sounds: Normal breath sounds. No stridor. No wheezing, rhonchi or rales.  Chest:     Chest wall: No tenderness.  Abdominal:     General: Abdomen is flat. Bowel sounds are normal. There is no distension.     Palpations: There is no mass.     Tenderness: There is no abdominal tenderness. There is no right CVA tenderness, left CVA tenderness, guarding or rebound.     Hernia: No hernia is present.   Musculoskeletal:        General: No swelling, tenderness, deformity or signs of injury. Normal range of motion.     Cervical back: Normal range of motion and neck supple. No rigidity or tenderness.     Right lower leg: No edema.     Left lower leg: No edema.  Lymphadenopathy:     Cervical: No cervical adenopathy.  Skin:    General: Skin is warm and dry.     Capillary Refill: Capillary refill takes less than 2 seconds.     Coloration: Skin is not jaundiced or pale.     Findings: No bruising, erythema, lesion or rash.  Neurological:     General: No focal deficit present.     Mental Status: She is alert and oriented to person, place, and time. Mental status is at baseline.     Cranial Nerves: No cranial nerve deficit.     Sensory: No sensory deficit.     Motor: No weakness.     Coordination: Coordination normal.     Gait: Gait normal.     Deep Tendon Reflexes: Reflexes normal.  Psychiatric:  Mood and Affect: Mood normal.        Behavior: Behavior normal.        Thought Content: Thought content normal.        Judgment: Judgment normal.     LABS:   CBC Latest Ref Rng & Units 02/03/2020 01/23/2020 01/09/2020  WBC - 7.0 5.5 5.7  Hemoglobin 12.0 - 16.0 11.6(A) 12.2 13.0  Hematocrit 36 - 46 35(A) 37 40  Platelets 150 - 399 172 178 192   CMP Latest Ref Rng & Units 02/03/2020 01/23/2020 01/09/2020  BUN 4 - _0 Creatinine 0.5 - 1.1 0.8 0.9 0.8  Sodium 137 - 147 136(A) 142 140  Potassium 3.4 - 5.3 3.8 3.8 4.7  Chloride 99 - 108 106 109(A) 102  CO2 13 - 22 24(A) 29(A) 26(A)  Calcium 8.7 - 10.7 9.6 10.1 10.5  Alkaline Phos 25 - 125 80 75 87  AST 13 - 35 _1 ALT 7 - 35 _2 STUDIES:   She underwent an MRI head without contrast on 09/14/2019 showing: 1.  Small acute infarct in the left frontal cortex, MCA distribution 2.  Moderate chronic small vessel ischemia and small remote cerebellar infarcts  She underwent a thyroid ultrasound on 12/04/2019  showing: 1.  Multinodular goiter 2.  Solid nodule measuring up to 1.8 cm in the mid right thyroid gland is highly suspicious for malignancy and meets criteria for tissue sampling.  Recommend ultrasound guided fine needle aspiration. 3.  Mixed cystic and solid nodulein the right inferior thyroid gland is mildly suspicious for malignancy.  Recommend 1 year ultrasound follow up. 4.  Additional scattered benign nodules throughout the thyroid gland including a 1.8 cm spongiform nodule in the left inferior thyroid. These nodules do not meet criteria for dedicated follow up.  She underwent an ultrasound guided fine needle aspiration of indeterminate thyroid nodule on 12/18/2019 and surgical pathology from this procedure revealed: 1.  Thyroid, fine needle aspiration, right-mid:  -  Consistent with benign follicular nodule, [place] category II.  She underwent digital diagnostic bilateral mammogram with tomography and right breast ultrasound on 12/26/2019 showing: breast density category B.  Highly suspicious 2.9 cm upper right breast mass.  Tissue sampling is recommended.  No abnormal right axillary lymph nodes.  No mammographic evidence of left breast malignancy.  She underwent ultrasound guided right breast core needle biopsy on 01/01/2020 and surgical pathology from this procedure revealed: 1.  Breast, right, needle core biopsy, 11:30 o'clock, 3 cmfn:  -  Invasive ductal carcinoma, grade 2/3, and DCIS with central necrosis  -  Tumor involves all cores, 8 mm in maximal linear extent HER2: Positive 3+ ER: Positive 95% PR: Positive 30% Ki67: 40%    HISTORY:   Past Medical History:  Diagnosis Date  . Arthritis    osteoarthritis  . Breast cancer ([place])   . Diabetes mellitus without complication ([facility] [facility])    not on medication  . History of CVA (cerebrovascular accident)   . Hypertension   . Multiple thyroid nodules    benign    Past Surgical History:  Procedure Laterality Date  . BIOPSY  THYROID     benign  . BREAST BIOPSY      Family History  Problem Relation Age of Onset  . Colon cancer Father        15s  . Breast cancer Sister        51-60  . Colon cancer Brother  79s  . Prostate cancer Brother        late 65s  . Breast cancer Half-Sister        late 52s early 26s    Social History:  reports that she has never smoked. She has never used smokeless tobacco. She reports previous alcohol use. She reports that she does not use drugs.The patient is accompanied by her daughter Brandy Jacobs today.  She is widowed and lives at home alone.  She has 2 children, her daughter the only one living.  She has been exposed to second hand smoke.  Allergies: No Known Allergies  Current Medications: Current Outpatient Medications  Medication Sig Dispense Refill  . acetaminophen (TYLENOL) 500 MG tablet Take 500 mg by mouth at bedtime.    Marland Kitchen aspirin EC 81 MG tablet Take 81 mg by mouth daily. Swallow whole.    Marland Kitchen atorvastatin (LIPITOR) 40 MG tablet Take 40 mg by mouth daily.    Marland Kitchen dexamethasone (DECADRON) 4 MG tablet Take 1 tablet (4 mg total) by mouth as directed. 3 pills night before and 3 pills morning of first chemo, then 1 pill night before and morning of weekly chemo 30 tablet 2  . losartan (COZAAR) 50 MG tablet Take 50 mg by mouth daily.    . ondansetron (ZOFRAN) 4 MG tablet Take 1 tablet (4 mg total) by mouth every 4 (four) hours as needed for nausea or vomiting. 30 tablet 5  . oxyCODONE (OXY IR/ROXICODONE) 5 MG immediate release tablet SMARTSIG:1 Tablet(s) By Mouth 4-5 Times Daily    . predniSONE (DELTASONE) 5 MG tablet Take 2.5 mg by mouth 2 (two) times a week. TAKES 1/2 TABLET OF 5MG TWICE A WEEK    . prochlorperazine (COMPAZINE) 10 MG tablet Take 1 tablet (10 mg total) by mouth every 6 (six) hours as needed for nausea or vomiting. 30 tablet 5  . Vitamin D3 (VITAMIN D) 25 MCG tablet Take 1,000 Units by mouth daily.     No current facility-administered medications for this  visit.     ASSESSMENT & PLAN:   Assessment:   1.  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  HER2 and hormone receptor positive.  Currently her mass measures 2.9 cm, so we discussed neoadjuvant chemotherapy options today. She received her first week on 01-28-2020. We will hold this week due to elevated bilirubin.  2.  History of stroke in late August 2021.  She only has residual difficulty with finding the correct words when speaking, and minor short term memory loss when she becomes excitable.    3.  Multiple thyroid nodules, benign on biopsy from November 2021.  4.  Chronically elevated calcium, mild.  She states that this fluctuates up and down, and she has been evaluated by an endocrinologist.  Plan: We will hold chemotherapy this week due to elevated bilirubin. She is scheduled for stereotactic breast biopsy with Dr. Lilia Pro on Wednesday. She will continue with imodium and attempt to increase her oral intake. We will see her back in one week to evaluate CBC, CMP and review pathology. Both the patient and her daughter verbalize understanding of and agreement to the plans discussed today. They know to call the office should any new questions or concerns arise.     I provided 30  minutes of face-to-face time during this this encounter and > 50% was spent counseling as documented under my assessment and plan.    Melodye Ped, NP [facility]  [facility] [address] [place] [address] Dept: [phone] Dept Fax: [phone]

## 2020-02-04 ENCOUNTER — Inpatient Hospital Stay: Payer: Medicare Other

## 2020-02-05 ENCOUNTER — Inpatient Hospital Stay: Admission: RE | Admit: 2020-02-05 | Payer: Medicare Other | Source: Ambulatory Visit

## 2020-02-06 ENCOUNTER — Ambulatory Visit: Payer: Medicare Other | Admitting: Oncology

## 2020-02-06 ENCOUNTER — Telehealth: Payer: Self-pay | Admitting: Hematology and Oncology

## 2020-02-06 ENCOUNTER — Other Ambulatory Visit: Payer: Medicare Other

## 2020-02-06 NOTE — Telephone Encounter (Signed)
Patient's daughter called to check on patient's Appts for next week.    Patient did not have Breast Bx due to getting lost in [place].  Her Appt got rescheduled to 1/20 or 1/24.  Per Melissa, Appt needs to stay the same 1/17 Labs, Follow Up and they will decide then her next appt

## 2020-02-10 ENCOUNTER — Telehealth: Payer: Self-pay | Admitting: Hematology and Oncology

## 2020-02-10 ENCOUNTER — Inpatient Hospital Stay: Payer: Medicare Other

## 2020-02-10 ENCOUNTER — Other Ambulatory Visit: Payer: Self-pay

## 2020-02-10 ENCOUNTER — Inpatient Hospital Stay (INDEPENDENT_AMBULATORY_CARE_PROVIDER_SITE_OTHER): Payer: Medicare Other | Admitting: Hematology and Oncology

## 2020-02-10 ENCOUNTER — Other Ambulatory Visit: Payer: Self-pay | Admitting: Pharmacist

## 2020-02-10 VITALS — BP 146/66 | HR 74 | Temp 98.0°F | Resp 14 | Ht 63.5 in | Wt 157.8 lb

## 2020-02-10 DIAGNOSIS — C50411 Malignant neoplasm of upper-outer quadrant of right female breast: Secondary | ICD-10-CM | POA: Diagnosis not present

## 2020-02-10 DIAGNOSIS — Z170 Estrogen receptor positive status [ER+]: Secondary | ICD-10-CM

## 2020-02-10 LAB — HEPATIC FUNCTION PANEL
ALT: 21 (ref 7–35)
AST: 26 (ref 13–35)
Alkaline Phosphatase: 94 (ref 25–125)
Bilirubin, Total: 1.4

## 2020-02-10 LAB — BASIC METABOLIC PANEL
BUN: 14 (ref 4–21)
CO2: 26 — AB (ref 13–22)
Chloride: 107 (ref 99–108)
Creatinine: 0.8 (ref 0.5–1.1)
Glucose: 129
Potassium: 4.1 (ref 3.4–5.3)
Sodium: 140 (ref 137–147)

## 2020-02-10 LAB — CBC AND DIFFERENTIAL
HCT: 37 (ref 36–46)
Hemoglobin: 12.1 (ref 12.0–16.0)
Neutrophils Absolute: 2.21
Platelets: 244 (ref 150–399)
WBC: 3.5

## 2020-02-10 LAB — CBC
MCV: 95 (ref 81–99)
RBC: 3.9 (ref 3.87–5.11)

## 2020-02-10 LAB — COMPREHENSIVE METABOLIC PANEL
Albumin: 4 (ref 3.5–5.0)
Calcium: 10.3 (ref 8.7–10.7)

## 2020-02-10 LAB — MAGNESIUM: Magnesium: 2

## 2020-02-10 NOTE — Progress Notes (Signed)
[facility]  [address] [place],  [place]  [address] [phone]  Clinic Day:  02/10/2020  Referring physician: Guadalupe Maple, MD     CHIEF COMPLAINT:  CC: Invasive ductal carcinoma of the right breast  Current Treatment:  Weekly paclitaxel x 9 weeks with trastuzumab/pertuzumab every 3 weeks.  HISTORY OF PRESENT ILLNESS:  Brandy Jacobs is a 87 y.o. female referred by Dr. Orrin Brigham for the evaluation and treatment of invasive ductal carcinoma of the right breast.  This began when the patient felt a lump of the right breast, which quickly enlarged.  Diagnostic mammogram confirmed a highly suspicious 2.9 cm upper right breast mass.  No abnormal right axillary lymph nodes were seen and left breast was negative.  Ultrasound guided biopsy was pursued and surgical pathology from that procedure confirmed invasive ductal carcinoma, grade 2/3, and DCIS with central necrosis.  HER2 was positive, estrogen receptor was positive at 95% and progesterone receptor was positive at 30%.  Ki67 was 40%.  Normally, we would do a pretreatment MRI, but we don't know if this is necessary as the lesion is easily palpable and she would have some musculoskeletal difficulties tolerating the position required.  As she is HER2 positive, we recommended neoadjuvant chemotherapy with paclitaxel weekly for 9 doses and Herceptin/ Perjeta every 3 weeks for a total of 1 year. She received her first cycle on January 4th.  She has a history of stroke in late August and still has some difficulty finding the right words and if she gets excited, she will become forgetful.  She also has several nodules of the thyroid, but biopsy was benign in November.  She has diabetes, but is not on medication.  She was planned for a stereotactic breast biopsy last week to help decide on future surgery. Day 8 of cycle 1 was held due to elevated bilirubin.  INTERVAL HISTORY:  Brandy Jacobs is here today prior to day 15  of cycle 1 of paclitaxel/ trastuzumab/ pertuzumab.  She reports intermittent nausea and diarrhea, for which medication is effective. She denies fevers or chills.  She denies pain.   She states her appetite is good, but she is not eating as much as previous. Her weight has decreased 3 pounds over last week. She did not make it to the stereotactic breast biopsy in [place] last week. This has been rescheduled to January 24th.  REVIEW OF SYSTEMS:  Review of Systems  Constitutional: Positive for appetite change and unexpected weight change. Negative for chills, fatigue and fever.  HENT:   Negative for lump/mass, mouth sores and sore throat.   Respiratory: Negative for cough and shortness of breath.   Cardiovascular: Negative for chest pain and leg swelling.  Gastrointestinal: Positive for diarrhea and nausea. Negative for abdominal pain, constipation and vomiting.  Genitourinary: Negative for difficulty urinating, dysuria, frequency and hematuria.   Musculoskeletal: Negative for arthralgias, back pain and myalgias.  Skin: Negative for rash.  Neurological: Negative for dizziness and headaches.  Psychiatric/Behavioral: Negative for depression and sleep disturbance. The patient is not nervous/anxious.      VITALS:  There were no vitals taken for this visit.  Wt Readings from Last 3 Encounters:  02/03/20 160 lb 6.4 oz (72.8 kg)  01/28/20 163 lb 4 oz (74 kg)  01/09/20 160 lb 8 oz (72.8 kg)    There is no height or weight on file to calculate BMI.  Performance status (ECOG): 1 - Symptomatic but completely ambulatory  PHYSICAL  EXAM:  Physical Exam Vitals and nursing note reviewed.  Constitutional:      General: She is not in acute distress.    Appearance: Normal appearance. She is normal weight.  HENT:     Mouth/Throat:     Mouth: Mucous membranes are moist.     Pharynx: Oropharynx is clear. No oropharyngeal exudate or posterior oropharyngeal erythema.  Eyes:     General: No scleral  icterus.    Extraocular Movements: Extraocular movements intact.     Conjunctiva/sclera: Conjunctivae normal.     Pupils: Pupils are equal, round, and reactive to light.  Cardiovascular:     Rate and Rhythm: Normal rate and regular rhythm.     Heart sounds: Normal heart sounds. No murmur heard. No friction rub. No gallop.   Pulmonary:     Effort: Pulmonary effort is normal. No respiratory distress.     Breath sounds: Normal breath sounds. No stridor. No wheezing, rhonchi or rales.  Chest:  Breasts:     Right: Mass present. No swelling, bleeding, inverted nipple, nipple discharge, skin change or tenderness.     Left: Normal. No swelling, bleeding, inverted nipple, mass, nipple discharge, skin change or tenderness.     Abdominal:     General: There is no distension.     Palpations: Abdomen is soft. There is no hepatomegaly, splenomegaly or mass.     Tenderness: There is no abdominal tenderness. There is no guarding.     Hernia: No hernia is present.  Musculoskeletal:        General: Normal range of motion.     Cervical back: Normal range of motion and neck supple. No tenderness.     Right lower leg: No edema.     Left lower leg: No edema.  Lymphadenopathy:     Cervical: No cervical adenopathy.  Skin:    General: Skin is warm.     Coloration: Skin is not jaundiced.     Findings: No rash.  Neurological:     Mental Status: She is alert and oriented to person, place, and time.     Cranial Nerves: No cranial nerve deficit.  Psychiatric:        Mood and Affect: Mood normal.        Behavior: Behavior normal.        Thought Content: Thought content normal.     LABS:   CBC Latest Ref Rng & Units 02/03/2020 01/23/2020 01/09/2020  WBC - 7.0 5.5 5.7  Hemoglobin 12.0 - 16.0 11.6(A) 12.2 13.0  Hematocrit 36 - 46 35(A) 37 40  Platelets 150 - 399 172 178 192   CMP Latest Ref Rng & Units 02/03/2020 01/23/2020 01/09/2020  BUN 4 - _0 Creatinine 0.5 - 1.1 0.8 0.9 0.8  Sodium  137 - 147 136(A) 142 140  Potassium 3.4 - 5.3 3.8 3.8 4.7  Chloride 99 - 108 106 109(A) 102  CO2 13 - 22 24(A) 29(A) 26(A)  Calcium 8.7 - 10.7 9.6 10.1 10.5  Alkaline Phos 25 - 125 80 75 87  AST 13 - 35 _1 ALT 7 - 35 _2 STUDIES:    HISTORY:   Past Medical History:  Diagnosis Date  . Arthritis    osteoarthritis  . Breast cancer ([place])   . Diabetes mellitus without complication ([place])    not on medication  . History of CVA (cerebrovascular accident)   . Hypertension   . Multiple thyroid  nodules    benign    Past Surgical History:  Procedure Laterality Date  . BIOPSY THYROID     benign  . BREAST BIOPSY      Family History  Problem Relation Age of Onset  . Colon cancer Father        84s  . Breast cancer Sister        60-60  . Colon cancer Brother        25s  . Prostate cancer Brother        late 84s  . Breast cancer Half-Sister        late 74s early 90s    Social History:  reports that she has never smoked. She has never used smokeless tobacco. She reports previous alcohol use. She reports that she does not use drugs.The patient is accompanied by her daughter, Brandy Jacobs, today.    Allergies: No Known Allergies  Current Medications: Current Outpatient Medications  Medication Sig Dispense Refill  . acetaminophen (TYLENOL) 500 MG tablet Take 500 mg by mouth at bedtime.    Marland Kitchen aspirin EC 81 MG tablet Take 81 mg by mouth daily. Swallow whole.    Marland Kitchen atorvastatin (LIPITOR) 40 MG tablet Take 40 mg by mouth daily.    Marland Kitchen dexamethasone (DECADRON) 4 MG tablet Take 1 tablet (4 mg total) by mouth as directed. 3 pills night before and 3 pills morning of first chemo, then 1 pill night before and morning of weekly chemo 30 tablet 2  . losartan (COZAAR) 50 MG tablet Take 50 mg by mouth daily.    . ondansetron (ZOFRAN) 4 MG tablet Take 1 tablet (4 mg total) by mouth every 4 (four) hours as needed for nausea or vomiting. 30 tablet 5  . oxyCODONE (OXY IR/ROXICODONE) 5 MG  immediate release tablet SMARTSIG:1 Tablet(s) By Mouth 4-5 Times Daily    . predniSONE (DELTASONE) 5 MG tablet Take 2.5 mg by mouth 2 (two) times a week. TAKES 1/2 TABLET OF 5MG TWICE A WEEK    . prochlorperazine (COMPAZINE) 10 MG tablet Take 1 tablet (10 mg total) by mouth every 6 (six) hours as needed for nausea or vomiting. 30 tablet 5  . Vitamin D3 (VITAMIN D) 25 MCG tablet Take 1,000 Units by mouth daily.     No current facility-administered medications for this visit.     ASSESSMENT & PLAN:   Assessment:   1. 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  HER2 and hormone receptor positive.  She is receiving neoadjuvant paclitaxel/trastuzumab/pertuzumab.  Day 8 of paclitaxel was held due to elevated bilirubin.  As her bilirubin is nearly back to baseline, we will proceed with paclitaxel this week. 2. History of stroke in late August 2021.  She only has residual difficulty with finding the correct words when speaking, and minor short term memory loss when she becomes excitable.  This is stable. 3. Multiple thyroid nodules, benign on biopsy from November 2021. 4. Chronic, mild hypercalcemia, which is stable. She has been evaluated by an endocrinologist without specific etiology.  Plan:  We will proceed with day 15 of paclitaxel this week.  We will plan to see her back in one week with a CBC and comprehensive metabolic panel prior to a 2nd cycle of paclitaxel/trastuzumab/pertuzumab. The patient and her daughter understand the plans discussed today and are in agreement with them. They know to contact our office if she develops issues requiring immediate clinical assessment.    Vida Roller  Lydia Guiles, PA-C [facility] [address] [place] [address] Dept: [phone] Dept Fax: [phone]

## 2020-02-10 NOTE — Telephone Encounter (Signed)
Per 1/17 los next appt given to patient

## 2020-02-11 ENCOUNTER — Inpatient Hospital Stay: Payer: Medicare Other

## 2020-02-11 ENCOUNTER — Telehealth: Payer: Self-pay | Admitting: General Practice

## 2020-02-11 ENCOUNTER — Encounter: Payer: Self-pay | Admitting: Hematology and Oncology

## 2020-02-11 NOTE — Telephone Encounter (Signed)
Pt has had the covid pfizer vaccines and the booster as well. Pt will bring vaccine card at next visit to the dr.

## 2020-02-12 NOTE — Progress Notes (Signed)
Patient was approved for co-pay assistance through [employer]. 01/29/2020-01/28/2021 ID# [id number] I called and let patients daughter Marcelline Mates know, provided her with my number if she has any questions.

## 2020-02-13 ENCOUNTER — Inpatient Hospital Stay: Payer: Medicare Other

## 2020-02-13 ENCOUNTER — Other Ambulatory Visit: Payer: Self-pay

## 2020-02-13 VITALS — BP 169/75 | HR 66 | Temp 98.3°F | Resp 16 | Ht 63.5 in | Wt 154.2 lb

## 2020-02-13 DIAGNOSIS — C50411 Malignant neoplasm of upper-outer quadrant of right female breast: Secondary | ICD-10-CM

## 2020-02-13 DIAGNOSIS — Z5111 Encounter for antineoplastic chemotherapy: Secondary | ICD-10-CM | POA: Diagnosis not present

## 2020-02-13 DIAGNOSIS — Z170 Estrogen receptor positive status [ER+]: Secondary | ICD-10-CM

## 2020-02-13 MED ORDER — FAMOTIDINE IN NACL 20-0.9 MG/50ML-% IV SOLN
20.0000 mg | Freq: Once | INTRAVENOUS | Status: AC
  Administered 2020-02-13: 20 mg via INTRAVENOUS

## 2020-02-13 MED ORDER — HEPARIN SOD (PORK) LOCK FLUSH 100 UNIT/ML IV SOLN
500.0000 [IU] | Freq: Once | INTRAVENOUS | Status: AC | PRN
  Administered 2020-02-13: 500 [IU]
  Filled 2020-02-13: qty 5

## 2020-02-13 MED ORDER — DIPHENHYDRAMINE HCL 50 MG/ML IJ SOLN
INTRAMUSCULAR | Status: AC
  Filled 2020-02-13: qty 1

## 2020-02-13 MED ORDER — SODIUM CHLORIDE 0.9 % IV SOLN
Freq: Once | INTRAVENOUS | Status: AC
  Filled 2020-02-13: qty 250

## 2020-02-13 MED ORDER — SODIUM CHLORIDE 0.9 % IV SOLN
62.4000 mg/m2 | Freq: Once | INTRAVENOUS | Status: AC
  Administered 2020-02-13: 114 mg via INTRAVENOUS
  Filled 2020-02-13 (×2): qty 19

## 2020-02-13 MED ORDER — FAMOTIDINE IN NACL 20-0.9 MG/50ML-% IV SOLN
INTRAVENOUS | Status: AC
  Filled 2020-02-13: qty 50

## 2020-02-13 MED ORDER — SODIUM CHLORIDE 0.9 % IV SOLN
10.0000 mg | Freq: Once | INTRAVENOUS | Status: AC
  Administered 2020-02-13: 10 mg via INTRAVENOUS
  Filled 2020-02-13 (×2): qty 1

## 2020-02-13 MED ORDER — DIPHENHYDRAMINE HCL 50 MG/ML IJ SOLN
25.0000 mg | Freq: Once | INTRAMUSCULAR | Status: AC
  Administered 2020-02-13: 25 mg via INTRAVENOUS

## 2020-02-13 NOTE — Patient Instructions (Signed)
[facility] Discharge Instructions for Patients Receiving Chemotherapy  Today you received the following chemotherapy agents Paclitaxel.  To help prevent nausea and vomiting after your treatment, we encourage you to take your nausea medication.  Paclitaxel injection What is this medicine? PACLITAXEL (PAK li TAX el) is a chemotherapy drug. It targets fast dividing cells, like cancer cells, and causes these cells to die. This medicine is used to treat ovarian cancer, breast cancer, lung cancer, Kaposi's sarcoma, and other cancers. This medicine may be used for other purposes; ask your health care provider or pharmacist if you have questions. COMMON BRAND NAME(S): Onxol, Taxol What should I tell my health care provider before I take this medicine? They need to know if you have any of these conditions:  history of irregular heartbeat  liver disease  low blood counts, like low white cell, platelet, or red cell counts  lung or breathing disease, like asthma  tingling of the fingers or toes, or other nerve disorder  an unusual or allergic reaction to paclitaxel, alcohol, polyoxyethylated castor oil, other chemotherapy, other medicines, foods, dyes, or preservatives  pregnant or trying to get pregnant  breast-feeding How should I use this medicine? This drug is given as an infusion into a vein. It is administered in a hospital or clinic by a specially trained health care professional. Talk to your pediatrician regarding the use of this medicine in children. Special care may be needed. Overdosage: If you think you have taken too much of this medicine contact a poison control center or emergency room at once. NOTE: This medicine is only for you. Do not share this medicine with others. What if I miss a dose? It is important not to miss your dose. Call your doctor or health care professional if you are unable to keep an appointment. What may interact with this  medicine? Do not take this medicine with any of the following medications:  live virus vaccines This medicine may also interact with the following medications:  antiviral medicines for hepatitis, HIV or AIDS  certain antibiotics like erythromycin and clarithromycin  certain medicines for fungal infections like ketoconazole and itraconazole  certain medicines for seizures like carbamazepine, phenobarbital, phenytoin  gemfibrozil  nefazodone  rifampin  St. John's wort This list may not describe all possible interactions. Give your health care provider a list of all the medicines, herbs, non-prescription drugs, or dietary supplements you use. Also tell them if you smoke, drink alcohol, or use illegal drugs. Some items may interact with your medicine. What should I watch for while using this medicine? Your condition will be monitored carefully while you are receiving this medicine. You will need important blood work done while you are taking this medicine. This medicine can cause serious allergic reactions. To reduce your risk you will need to take other medicine(s) before treatment with this medicine. If you experience allergic reactions like skin rash, itching or hives, swelling of the face, lips, or tongue, tell your doctor or health care professional right away. In some cases, you may be given additional medicines to help with side effects. Follow all directions for their use. This drug may make you feel generally unwell. This is not uncommon, as chemotherapy can affect healthy cells as well as cancer cells. Report any side effects. Continue your course of treatment even though you feel ill unless your doctor tells you to stop. Call your doctor or health care professional for advice if you get a fever, chills or   sore throat, or other symptoms of a cold or flu. Do not treat yourself. This drug decreases your body's ability to fight infections. Try to avoid being around people who are  sick. This medicine may increase your risk to bruise or bleed. Call your doctor or health care professional if you notice any unusual bleeding. Be careful brushing and flossing your teeth or using a toothpick because you may get an infection or bleed more easily. If you have any dental work done, tell your dentist you are receiving this medicine. Avoid taking products that contain aspirin, acetaminophen, ibuprofen, naproxen, or ketoprofen unless instructed by your doctor. These medicines may hide a fever. Do not become pregnant while taking this medicine. Women should inform their doctor if they wish to become pregnant or think they might be pregnant. There is a potential for serious side effects to an unborn child. Talk to your health care professional or pharmacist for more information. Do not breast-feed an infant while taking this medicine. Men are advised not to father a child while receiving this medicine. This product may contain alcohol. Ask your pharmacist or healthcare provider if this medicine contains alcohol. Be sure to tell all healthcare providers you are taking this medicine. Certain medicines, like metronidazole and disulfiram, can cause an unpleasant reaction when taken with alcohol. The reaction includes flushing, headache, nausea, vomiting, sweating, and increased thirst. The reaction can last from 30 minutes to several hours. What side effects may I notice from receiving this medicine? Side effects that you should report to your doctor or health care professional as soon as possible:  allergic reactions like skin rash, itching or hives, swelling of the face, lips, or tongue  breathing problems  changes in vision  fast, irregular heartbeat  high or low blood pressure  mouth sores  pain, tingling, numbness in the hands or feet  signs of decreased platelets or bleeding - bruising, pinpoint red spots on the skin, black, tarry stools, blood in the urine  signs of decreased  red blood cells - unusually weak or tired, feeling faint or lightheaded, falls  signs of infection - fever or chills, cough, sore throat, pain or difficulty passing urine  signs and symptoms of liver injury like dark yellow or brown urine; general ill feeling or flu-like symptoms; light-colored stools; loss of appetite; nausea; right upper belly pain; unusually weak or tired; yellowing of the eyes or skin  swelling of the ankles, feet, hands  unusually slow heartbeat Side effects that usually do not require medical attention (report to your doctor or health care professional if they continue or are bothersome):  diarrhea  hair loss  loss of appetite  muscle or joint pain  nausea, vomiting  pain, redness, or irritation at site where injected  tiredness This list may not describe all possible side effects. Call your doctor for medical advice about side effects. You may report side effects to FDA at 1-800-FDA-1088. Where should I keep my medicine? This drug is given in a hospital or clinic and will not be stored at home. NOTE: This sheet is a summary. It may not cover all possible information. If you have questions about this medicine, talk to your doctor, pharmacist, or health care provider.  2021 Elsevier/Gold Standard (2018-12-12 13:37:23)    If you develop nausea and vomiting that is not controlled by your nausea medication, call the clinic.   BELOW ARE SYMPTOMS THAT SHOULD BE REPORTED IMMEDIATELY:  *FEVER GREATER THAN 100.5 F  *CHILLS WITH OR   WITHOUT FEVER  NAUSEA AND VOMITING THAT IS NOT CONTROLLED WITH YOUR NAUSEA MEDICATION  *UNUSUAL SHORTNESS OF BREATH  *UNUSUAL BRUISING OR BLEEDING  TENDERNESS IN MOUTH AND THROAT WITH OR WITHOUT PRESENCE OF ULCERS  *URINARY PROBLEMS  *BOWEL PROBLEMS  UNUSUAL RASH Items with * indicate a potential emergency and should be followed up as soon as possible.  Feel free to call the clinic should you have any questions or  concerns at The clinic phone number is [phone].  Please show the CHEMO ALERT CARD at check-in to the Emergency Department and triage nurse.   

## 2020-02-14 ENCOUNTER — Telehealth: Payer: Self-pay | Admitting: Hematology and Oncology

## 2020-02-14 NOTE — Telephone Encounter (Signed)
Patient's daughter verified patient's Appts for next week

## 2020-02-17 ENCOUNTER — Ambulatory Visit
Admission: RE | Admit: 2020-02-17 | Discharge: 2020-02-17 | Disposition: A | Discharge status: Home / Self Care | Payer: Medicare Other | Source: Ambulatory Visit | Attending: Surgery | Admitting: Surgery

## 2020-02-17 ENCOUNTER — Other Ambulatory Visit: Payer: Self-pay

## 2020-02-17 DIAGNOSIS — R928 Other abnormal and inconclusive findings on diagnostic imaging of breast: Secondary | ICD-10-CM

## 2020-02-18 ENCOUNTER — Telehealth: Payer: Self-pay | Admitting: Oncology

## 2020-02-18 ENCOUNTER — Inpatient Hospital Stay (INDEPENDENT_AMBULATORY_CARE_PROVIDER_SITE_OTHER): Payer: Medicare Other | Admitting: Hematology and Oncology

## 2020-02-18 ENCOUNTER — Encounter: Payer: Self-pay | Admitting: Hematology and Oncology

## 2020-02-18 ENCOUNTER — Inpatient Hospital Stay: Payer: Medicare Other

## 2020-02-18 VITALS — BP 149/67 | HR 74 | Temp 98.5°F | Resp 18 | Ht 63.5 in | Wt 155.1 lb

## 2020-02-18 DIAGNOSIS — Z170 Estrogen receptor positive status [ER+]: Secondary | ICD-10-CM | POA: Diagnosis not present

## 2020-02-18 DIAGNOSIS — C50411 Malignant neoplasm of upper-outer quadrant of right female breast: Secondary | ICD-10-CM

## 2020-02-18 LAB — HEPATIC FUNCTION PANEL
ALT: 29 (ref 7–35)
AST: 24 (ref 13–35)
Alkaline Phosphatase: 97 (ref 25–125)
Bilirubin, Total: 1.5

## 2020-02-18 LAB — CBC AND DIFFERENTIAL
HCT: 36 (ref 36–46)
Hemoglobin: 11.9 — AB (ref 12.0–16.0)
Neutrophils Absolute: 5.68
Platelets: 242 (ref 150–399)
WBC: 7.1

## 2020-02-18 LAB — BASIC METABOLIC PANEL
BUN: 22 — AB (ref 4–21)
CO2: 26 — AB (ref 13–22)
Chloride: 106 (ref 99–108)
Creatinine: 0.8 (ref 0.5–1.1)
Glucose: 175
Potassium: 3.3 — AB (ref 3.4–5.3)
Sodium: 139 (ref 137–147)

## 2020-02-18 LAB — COMPREHENSIVE METABOLIC PANEL
Albumin: 3.7 (ref 3.5–5.0)
Calcium: 9.4 (ref 8.7–10.7)

## 2020-02-18 LAB — CBC: RBC: 3.85 — AB (ref 3.87–5.11)

## 2020-02-18 NOTE — Progress Notes (Signed)
[facility]  [address] [place],    [id number] [phone]  Clinic Day:  02/18/2020  Referring physician: Guadalupe Maple, MD     CHIEF COMPLAINT:  CC: Invasive ductal carcinoma of the right breast  Current Treatment:  Weekly paclitaxel x 9 weeks with trastuzumab/pertuzumab every 3 weeks.  HISTORY OF PRESENT ILLNESS:  Brandy Jacobs is a 87 y.o. female referred by Dr. Orrin Brigham for the evaluation and treatment of invasive ductal carcinoma of the right breast.  This began when the patient felt a lump of the right breast, which quickly enlarged.  Diagnostic mammogram confirmed a highly suspicious 2.9 cm upper right breast mass.  No abnormal right axillary lymph nodes were seen and left breast was negative.  Ultrasound guided biopsy was pursued and surgical pathology from that procedure confirmed invasive ductal carcinoma, grade 2/3, and DCIS with central necrosis.  HER2 was positive, estrogen receptor was positive at 95% and progesterone receptor was positive at 30%.  Ki67 was 40%.  Normally, we would do a pretreatment MRI, but we don't know if this is necessary as the lesion is easily palpable and she would have some musculoskeletal difficulties tolerating the position required.  As she is HER2 positive, we recommended neoadjuvant chemotherapy with paclitaxel weekly for 9 doses and Herceptin/ Perjeta every 3 weeks for a total of 1 year. She received her first cycle on January 4th.  She has a history of stroke in late August and still has some difficulty finding the right words and if she gets excited, she will become forgetful.  She also has several nodules of the thyroid, but biopsy was benign in November.  She has diabetes, but is not on medication.  She was planned for a stereotactic breast biopsy last week to help decide on future surgery. Day 8 of cycle 1 was held due to elevated bilirubin.  She received day 15 of cycle 1 of paclitaxel last week  after the bilirubin nearly normalized.   INTERVAL HISTORY:  [name] is here today prior to day 1 of cycle 2 of paclitaxel/ trastuzumab/ pertuzumab.   She had her right breast biopsy in [facility] yesterday.  She had difficulty staying and to position due to pain from her arthritis.  She denies pain of the right breast.  She continues to report intermittent nausea and diarrhea, for which medication is effective. She states she felt constipated, then had an episode of bowel incontinence last night, which was mainly watery. She denies abdominal pain.  She denies fevers or chills.  She reports occasional mild bilateral hip pain.  She states her appetite is good, but she is not eating as much as previous. Her weight has decreased 2 pounds over last week.   REVIEW OF SYSTEMS:  Review of Systems  Constitutional: Positive for chills, fatigue and unexpected weight change. Negative for appetite change and fever.  HENT:   Negative for lump/mass, mouth sores and sore throat.   Respiratory: Negative for cough and shortness of breath.   Cardiovascular: Negative for chest pain and leg swelling.  Gastrointestinal: Positive for constipation, diarrhea and nausea. Negative for abdominal pain and vomiting.  Genitourinary: Negative for difficulty urinating, dysuria, frequency and hematuria.   Musculoskeletal: Positive for arthralgias. Negative for back pain and myalgias.  Skin: Negative for rash.  Neurological: Negative for dizziness and headaches.  Psychiatric/Behavioral: Negative for depression and sleep disturbance. The patient is not nervous/anxious.      VITALS:  Blood pressure (!) 149/67,  pulse 74, temperature 98.5 F (36.9 C), temperature source Oral, resp. rate 18, height 5' 3.5" (1.613 m), weight 155 lb 1.6 oz (70.4 kg), SpO2 94 %.  Wt Readings from Last 3 Encounters:  02/18/20 155 lb 1.6 oz (70.4 kg)  02/13/20 154 lb 4 oz (70 kg)  02/10/20 157 lb 12.8 oz (71.6 kg)    Body mass index is 27.04  kg/m.  Performance status (ECOG): 1 - Symptomatic but completely ambulatory  PHYSICAL EXAM:  Physical Exam Vitals and nursing note reviewed.  Constitutional:      General: She is not in acute distress.    Appearance: Normal appearance. She is normal weight.  HENT:     Head: Normocephalic and atraumatic.     Mouth/Throat:     Mouth: Mucous membranes are moist.     Pharynx: Oropharynx is clear. No oropharyngeal exudate or posterior oropharyngeal erythema.  Eyes:     General: No scleral icterus.    Extraocular Movements: Extraocular movements intact.     Conjunctiva/sclera: Conjunctivae normal.     Pupils: Pupils are equal, round, and reactive to light.  Cardiovascular:     Rate and Rhythm: Normal rate and regular rhythm.     Heart sounds: Normal heart sounds. No murmur heard. No friction rub. No gallop.   Pulmonary:     Effort: Pulmonary effort is normal. No respiratory distress.     Breath sounds: Normal breath sounds. No stridor. No wheezing or rales.  Chest:  Breasts:     Right: No axillary adenopathy or supraclavicular adenopathy.     Left: No axillary adenopathy or supraclavicular adenopathy.      Comments: Breast exam is deferred. There is no erythema at the right breast biopsy site. Abdominal:     General: Bowel sounds are normal. There is no distension.     Palpations: Abdomen is soft. There is no hepatomegaly, splenomegaly or mass.     Tenderness: There is no abdominal tenderness. There is no guarding.     Hernia: No hernia is present.  Musculoskeletal:        General: No swelling. Normal range of motion.     Cervical back: Normal range of motion and neck supple. No tenderness.     Right lower leg: No edema.     Left lower leg: No edema.  Lymphadenopathy:     Cervical: No cervical adenopathy.     Upper Body:     Right upper body: No supraclavicular or axillary adenopathy.     Left upper body: No supraclavicular or axillary adenopathy.     Lower Body: No right  inguinal adenopathy. No left inguinal adenopathy.  Skin:    General: Skin is warm.     Coloration: Skin is not jaundiced.     Findings: No rash.  Neurological:     Mental Status: She is alert and oriented to person, place, and time.     Cranial Nerves: No cranial nerve deficit.  Psychiatric:        Mood and Affect: Mood normal.        Behavior: Behavior normal.        Thought Content: Thought content normal.     LABS:   CBC Latest Ref Rng & Units 02/18/2020 02/10/2020 02/03/2020  WBC - 7.1 3.5 7.0  Hemoglobin 12.0 - 16.0 11.9(A) 12.1 11.6(A)  Hematocrit 36 - 46 36 37 35(A)  Platelets 150 - 399 242 244 172   CMP Latest Ref Rng & Units 02/18/2020 02/10/2020 02/03/2020  BUN 4 - 21 22(A) 14 19  Creatinine 0.5 - 1.1 0.8 0.8 0.8  Sodium 137 - 147 139 140 136(A)  Potassium 3.4 - 5.3 3.3(A) 4.1 3.8  Chloride 99 - 108 106 107 106  CO2 13 - 22 26(A) 26(A) 24(A)  Calcium 8.7 - 10.7 9.4 10.3 9.6  Alkaline Phos 25 - 125 97 94 80  AST 13 - 35 '24 26 17  ' ALT 7 - 35 '29 21 18    ' STUDIES:    HISTORY:   Past Medical History:  Diagnosis Date  . Arthritis    osteoarthritis  . Breast cancer ([place])   . Diabetes mellitus without complication ([place])    not on medication  . History of CVA (cerebrovascular accident)   . Hypertension   . Multiple thyroid nodules    benign    Past Surgical History:  Procedure Laterality Date  . BIOPSY THYROID     benign  . BREAST BIOPSY      Family History  Problem Relation Age of Onset  . Colon cancer Father        23s  . Breast cancer Sister        68-60  . Colon cancer Brother        70s  . Prostate cancer Brother        late 81s  . Breast cancer Half-Sister        late 88s early 43s    Social History:  reports that she has never smoked. She has never used smokeless tobacco. She reports previous alcohol use. She reports that she does not use drugs.The patient is accompanied by her daughter, Marcelline Mates, today.    Allergies: No Known  Allergies  Current Medications: Current Outpatient Medications  Medication Sig Dispense Refill  . acetaminophen (TYLENOL) 500 MG tablet Take 500 mg by mouth at bedtime.    Marland Kitchen aspirin EC 81 MG tablet Take 81 mg by mouth daily. Swallow whole.    Marland Kitchen atorvastatin (LIPITOR) 40 MG tablet Take 40 mg by mouth daily.    Marland Kitchen dexamethasone (DECADRON) 4 MG tablet Take 1 tablet (4 mg total) by mouth as directed. 3 pills night before and 3 pills morning of first chemo, then 1 pill night before and morning of weekly chemo 30 tablet 2  . losartan (COZAAR) 50 MG tablet Take 50 mg by mouth daily.    . ondansetron (ZOFRAN) 4 MG tablet Take 1 tablet (4 mg total) by mouth every 4 (four) hours as needed for nausea or vomiting. 30 tablet 5  . oxyCODONE (OXY IR/ROXICODONE) 5 MG immediate release tablet SMARTSIG:1 Tablet(s) By Mouth 4-5 Times Daily    . predniSONE (DELTASONE) 5 MG tablet Take 2.5 mg by mouth 2 (two) times a week. TAKES 1/2 TABLET OF 5MG TWICE A WEEK    . prochlorperazine (COMPAZINE) 10 MG tablet Take 1 tablet (10 mg total) by mouth every 6 (six) hours as needed for nausea or vomiting. 30 tablet 5  . Vitamin D3 (VITAMIN D) 25 MCG tablet Take 1,000 Units by mouth daily.     No current facility-administered medications for this visit.     ASSESSMENT & PLAN:   Assessment:   1. 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  She is receiving neoadjuvant paclitaxel/trastuzumab/pertuzumab.  Day 8 of cycle 1 of paclitaxel was held due to elevated bilirubin.  She received day 15 of cycle 1.  As  her bilirubin remains just mildly elevated, we will proceed with paclitaxel/trastuzumab/pertuzumab this week. She had additional biopsy of the right breast yesterday.  The results are pending.  This was to help with surgical decision making. 2. History of stroke in late August 2021.  She only has residual difficulty with finding the correct words when  speaking, and minor short term memory loss when she becomes excitable.  This is stable. 3. Multiple thyroid nodules, benign on biopsy from November 2021. 4. Chronic, mild hypercalcemia, which has been stable. She has been evaluated by an endocrinologist without specific etiology. 5. Diarrhea secondary to treatment, however, she describes this as watery leakage.  I am concerned that she may be constipated, but she does not wish to have an abdominal x-ray today for further evaluation.  Plan:  We will proceed with a 2nd cycleof paclitaxel/pertuzumab/trastuzumab and this week.  We will plan to see her back in one week with a CBC and comprehensive metabolic panel prior to day 8 of cycle 2 cycle of paclitaxel. The patient and her daughter understand the plans discussed today and are in agreement with them. They know to contact our office if she develops issues requiring immediate clinical assessment.    Marvia Pickles, PA-C [facility] [address] [place] [address] Dept: [phone] Dept Fax: [phone]

## 2020-02-18 NOTE — Telephone Encounter (Signed)
Per 1/25 los next appts sched and given to patient

## 2020-02-20 ENCOUNTER — Inpatient Hospital Stay: Payer: Medicare Other

## 2020-02-20 ENCOUNTER — Other Ambulatory Visit: Payer: Self-pay

## 2020-02-20 DIAGNOSIS — Z170 Estrogen receptor positive status [ER+]: Secondary | ICD-10-CM

## 2020-02-20 DIAGNOSIS — C50411 Malignant neoplasm of upper-outer quadrant of right female breast: Secondary | ICD-10-CM

## 2020-02-20 DIAGNOSIS — Z5111 Encounter for antineoplastic chemotherapy: Secondary | ICD-10-CM | POA: Diagnosis not present

## 2020-02-20 MED ORDER — SODIUM CHLORIDE 0.9 % IV SOLN
Freq: Once | INTRAVENOUS | Status: AC
  Filled 2020-02-20: qty 250

## 2020-02-20 MED ORDER — SODIUM CHLORIDE 0.9 % IV SOLN
10.0000 mg | Freq: Once | INTRAVENOUS | Status: AC
  Administered 2020-02-20: 10 mg via INTRAVENOUS
  Filled 2020-02-20: qty 10

## 2020-02-20 MED ORDER — SODIUM CHLORIDE 0.9 % IV SOLN
420.0000 mg | Freq: Once | INTRAVENOUS | Status: AC
  Administered 2020-02-20: 420 mg via INTRAVENOUS
  Filled 2020-02-20: qty 14

## 2020-02-20 MED ORDER — ACETAMINOPHEN 325 MG PO TABS
650.0000 mg | ORAL_TABLET | Freq: Once | ORAL | Status: AC
  Administered 2020-02-20: 650 mg via ORAL

## 2020-02-20 MED ORDER — DIPHENHYDRAMINE HCL 50 MG/ML IJ SOLN
25.0000 mg | Freq: Once | INTRAMUSCULAR | Status: AC
  Administered 2020-02-20: 25 mg via INTRAVENOUS

## 2020-02-20 MED ORDER — SODIUM CHLORIDE 0.9 % IV SOLN
62.4000 mg/m2 | Freq: Once | INTRAVENOUS | Status: AC
  Administered 2020-02-20: 114 mg via INTRAVENOUS
  Filled 2020-02-20: qty 19

## 2020-02-20 MED ORDER — DIPHENHYDRAMINE HCL 50 MG/ML IJ SOLN
INTRAMUSCULAR | Status: AC
  Filled 2020-02-20: qty 1

## 2020-02-20 MED ORDER — TRASTUZUMAB-DKST CHEMO 150 MG IV SOLR
6.0000 mg/kg | Freq: Once | INTRAVENOUS | Status: AC
  Administered 2020-02-20: 441 mg via INTRAVENOUS
  Filled 2020-02-20: qty 21

## 2020-02-20 MED ORDER — FAMOTIDINE IN NACL 20-0.9 MG/50ML-% IV SOLN
20.0000 mg | Freq: Once | INTRAVENOUS | Status: AC
  Administered 2020-02-20: 20 mg via INTRAVENOUS

## 2020-02-20 MED ORDER — ACETAMINOPHEN 325 MG PO TABS
ORAL_TABLET | ORAL | Status: AC
  Filled 2020-02-20: qty 2

## 2020-02-20 MED ORDER — FAMOTIDINE IN NACL 20-0.9 MG/50ML-% IV SOLN
INTRAVENOUS | Status: AC
  Filled 2020-02-20: qty 50

## 2020-02-20 MED ORDER — HEPARIN SOD (PORK) LOCK FLUSH 100 UNIT/ML IV SOLN
500.0000 [IU] | Freq: Once | INTRAVENOUS | Status: AC | PRN
  Administered 2020-02-20: 500 [IU]
  Filled 2020-02-20: qty 5

## 2020-02-20 NOTE — Patient Instructions (Signed)
Pertuzumab injection What is this medicine? PERTUZUMAB (per TOOZ ue mab) is a monoclonal antibody. It is used to treat breast cancer. This medicine may be used for other purposes; ask your health care provider or pharmacist if you have questions. COMMON BRAND NAME(S): PERJETA What should I tell my health care provider before I take this medicine? They need to know if you have any of these conditions:  heart disease  heart failure  high blood pressure  history of irregular heart beat  recent or ongoing radiation therapy  an unusual or allergic reaction to pertuzumab, other medicines, foods, dyes, or preservatives  pregnant or trying to get pregnant  breast-feeding How should I use this medicine? This medicine is for infusion into a vein. It is given by a health care professional in a hospital or clinic setting. Talk to your pediatrician regarding the use of this medicine in children. Special care may be needed. Overdosage: If you think you have taken too much of this medicine contact a poison control center or emergency room at once. NOTE: This medicine is only for you. Do not share this medicine with others. What if I miss a dose? It is important not to miss your dose. Call your doctor or health care professional if you are unable to keep an appointment. What may interact with this medicine? Interactions are not expected. Give your health care provider a list of all the medicines, herbs, non-prescription drugs, or dietary supplements you use. Also tell them if you smoke, drink alcohol, or use illegal drugs. Some items may interact with your medicine. This list may not describe all possible interactions. Give your health care provider a list of all the medicines, herbs, non-prescription drugs, or dietary supplements you use. Also tell them if you smoke, drink alcohol, or use illegal drugs. Some items may interact with your medicine. What should I watch for while using this  medicine? Your condition will be monitored carefully while you are receiving this medicine. Report any side effects. Continue your course of treatment even though you feel ill unless your doctor tells you to stop. Do not become pregnant while taking this medicine or for 7 months after stopping it. Women should inform their doctor if they wish to become pregnant or think they might be pregnant. Women of child-bearing potential will need to have a negative pregnancy test before starting this medicine. There is a potential for serious side effects to an unborn child. Talk to your health care professional or pharmacist for more information. Do not breast-feed an infant while taking this medicine or for 7 months after stopping it. Women must use effective birth control with this medicine. Call your doctor or health care professional for advice if you get a fever, chills or sore throat, or other symptoms of a cold or flu. Do not treat yourself. Try to avoid being around people who are sick. You may experience fever, chills, and headache during the infusion. Report any side effects during the infusion to your health care professional. What side effects may I notice from receiving this medicine? Side effects that you should report to your doctor or health care professional as soon as possible:  breathing problems  chest pain or palpitations  dizziness  feeling faint or lightheaded  fever or chills  skin rash, itching or hives  sore throat  swelling of the face, lips, or tongue  swelling of the legs or ankles  unusually weak or tired Side effects that usually do not require  medical attention (report to your doctor or health care professional if they continue or are bothersome):  diarrhea  hair loss  nausea, vomiting  tiredness This list may not describe all possible side effects. Call your doctor for medical advice about side effects. You may report side effects to FDA at  1-800-FDA-1088. Where should I keep my medicine? This drug is given in a hospital or clinic and will not be stored at home. NOTE: This sheet is a summary. It may not cover all possible information. If you have questions about this medicine, talk to your doctor, pharmacist, or health care provider.  2021 [employer] Standard (2015-02-12 12:08:50) Trastuzumab injection for infusion What is this medicine? TRASTUZUMAB (tras TOO zoo mab) is a monoclonal antibody. It is used to treat breast cancer and stomach cancer. This medicine may be used for other purposes; ask your health care provider or pharmacist if you have questions. COMMON BRAND NAME(S): Herceptin, Galvin Proffer, Trazimera What should I tell my health care provider before I take this medicine? They need to know if you have any of these conditions:  heart disease  heart failure  lung or breathing disease, like asthma  an unusual or allergic reaction to trastuzumab, benzyl alcohol, or other medications, foods, dyes, or preservatives  pregnant or trying to get pregnant  breast-feeding How should I use this medicine? This drug is given as an infusion into a vein. It is administered in a hospital or clinic by a specially trained health care professional. Talk to your pediatrician regarding the use of this medicine in children. This medicine is not approved for use in children. Overdosage: If you think you have taken too much of this medicine contact a poison control center or emergency room at once. NOTE: This medicine is only for you. Do not share this medicine with others. What if I miss a dose? It is important not to miss a dose. Call your doctor or health care professional if you are unable to keep an appointment. What may interact with this medicine? This medicine may interact with the following medications:  certain types of chemotherapy, such as daunorubicin, doxorubicin, epirubicin, and  idarubicin This list may not describe all possible interactions. Give your health care provider a list of all the medicines, herbs, non-prescription drugs, or dietary supplements you use. Also tell them if you smoke, drink alcohol, or use illegal drugs. Some items may interact with your medicine. What should I watch for while using this medicine? Visit your doctor for checks on your progress. Report any side effects. Continue your course of treatment even though you feel ill unless your doctor tells you to stop. Call your doctor or health care professional for advice if you get a fever, chills or sore throat, or other symptoms of a cold or flu. Do not treat yourself. Try to avoid being around people who are sick. You may experience fever, chills and shaking during your first infusion. These effects are usually mild and can be treated with other medicines. Report any side effects during the infusion to your health care professional. Fever and chills usually do not happen with later infusions. Do not become pregnant while taking this medicine or for 7 months after stopping it. Women should inform their doctor if they wish to become pregnant or think they might be pregnant. Women of child-bearing potential will need to have a negative pregnancy test before starting this medicine. There is a potential for serious side effects to an unborn  child. Talk to your health care professional or pharmacist for more information. Do not breast-feed an infant while taking this medicine or for 7 months after stopping it. Women must use effective birth control with this medicine. What side effects may I notice from receiving this medicine? Side effects that you should report to your doctor or health care professional as soon as possible:  allergic reactions like skin rash, itching or hives, swelling of the face, lips, or tongue  chest pain or palpitations  cough  dizziness  feeling faint or lightheaded,  falls  fever  general ill feeling or flu-like symptoms  signs of worsening heart failure like breathing problems; swelling in your legs and feet  unusually weak or tired Side effects that usually do not require medical attention (report to your doctor or health care professional if they continue or are bothersome):  bone pain  changes in taste  diarrhea  joint pain  nausea/vomiting  weight loss This list may not describe all possible side effects. Call your doctor for medical advice about side effects. You may report side effects to FDA at 1-800-FDA-1088. Where should I keep my medicine? This drug is given in a hospital or clinic and will not be stored at home. NOTE: This sheet is a summary. It may not cover all possible information. If you have questions about this medicine, talk to your doctor, pharmacist, or health care provider.  2021 [employer] Standard (2016-01-05 14:37:52) Paclitaxel injection What is this medicine? PACLITAXEL (PAK li TAX el) is a chemotherapy drug. It targets fast dividing cells, like cancer cells, and causes these cells to die. This medicine is used to treat ovarian cancer, breast cancer, lung cancer, Kaposi's sarcoma, and other cancers. This medicine may be used for other purposes; ask your health care provider or pharmacist if you have questions. COMMON BRAND NAME(S): Onxol, Taxol What should I tell my health care provider before I take this medicine? They need to know if you have any of these conditions:  history of irregular heartbeat  liver disease  low blood counts, like low white cell, platelet, or red cell counts  lung or breathing disease, like asthma  tingling of the fingers or toes, or other nerve disorder  an unusual or allergic reaction to paclitaxel, alcohol, polyoxyethylated castor oil, other chemotherapy, other medicines, foods, dyes, or preservatives  pregnant or trying to get pregnant  breast-feeding How should I use  this medicine? This drug is given as an infusion into a vein. It is administered in a hospital or clinic by a specially trained health care professional. Talk to your pediatrician regarding the use of this medicine in children. Special care may be needed. Overdosage: If you think you have taken too much of this medicine contact a poison control center or emergency room at once. NOTE: This medicine is only for you. Do not share this medicine with others. What if I miss a dose? It is important not to miss your dose. Call your doctor or health care professional if you are unable to keep an appointment. What may interact with this medicine? Do not take this medicine with any of the following medications:  live virus vaccines This medicine may also interact with the following medications:  antiviral medicines for hepatitis, HIV or AIDS  certain antibiotics like erythromycin and clarithromycin  certain medicines for fungal infections like ketoconazole and itraconazole  certain medicines for seizures like carbamazepine, phenobarbital, phenytoin  gemfibrozil  nefazodone  rifampin  St. John's wort This  list may not describe all possible interactions. Give your health care provider a list of all the medicines, herbs, non-prescription drugs, or dietary supplements you use. Also tell them if you smoke, drink alcohol, or use illegal drugs. Some items may interact with your medicine. What should I watch for while using this medicine? Your condition will be monitored carefully while you are receiving this medicine. You will need important blood work done while you are taking this medicine. This medicine can cause serious allergic reactions. To reduce your risk you will need to take other medicine(s) before treatment with this medicine. If you experience allergic reactions like skin rash, itching or hives, swelling of the face, lips, or tongue, tell your doctor or health care professional right  away. In some cases, you may be given additional medicines to help with side effects. Follow all directions for their use. This drug may make you feel generally unwell. This is not uncommon, as chemotherapy can affect healthy cells as well as cancer cells. Report any side effects. Continue your course of treatment even though you feel ill unless your doctor tells you to stop. Call your doctor or health care professional for advice if you get a fever, chills or sore throat, or other symptoms of a cold or flu. Do not treat yourself. This drug decreases your body's ability to fight infections. Try to avoid being around people who are sick. This medicine may increase your risk to bruise or bleed. Call your doctor or health care professional if you notice any unusual bleeding. Be careful brushing and flossing your teeth or using a toothpick because you may get an infection or bleed more easily. If you have any dental work done, tell your dentist you are receiving this medicine. Avoid taking products that contain aspirin, acetaminophen, ibuprofen, naproxen, or ketoprofen unless instructed by your doctor. These medicines may hide a fever. Do not become pregnant while taking this medicine. Women should inform their doctor if they wish to become pregnant or think they might be pregnant. There is a potential for serious side effects to an unborn child. Talk to your health care professional or pharmacist for more information. Do not breast-feed an infant while taking this medicine. Men are advised not to father a child while receiving this medicine. This product may contain alcohol. Ask your pharmacist or healthcare provider if this medicine contains alcohol. Be sure to tell all healthcare providers you are taking this medicine. Certain medicines, like metronidazole and disulfiram, can cause an unpleasant reaction when taken with alcohol. The reaction includes flushing, headache, nausea, vomiting, sweating, and  increased thirst. The reaction can last from 30 minutes to several hours. What side effects may I notice from receiving this medicine? Side effects that you should report to your doctor or health care professional as soon as possible:  allergic reactions like skin rash, itching or hives, swelling of the face, lips, or tongue  breathing problems  changes in vision  fast, irregular heartbeat  high or low blood pressure  mouth sores  pain, tingling, numbness in the hands or feet  signs of decreased platelets or bleeding - bruising, pinpoint red spots on the skin, black, tarry stools, blood in the urine  signs of decreased red blood cells - unusually weak or tired, feeling faint or lightheaded, falls  signs of infection - fever or chills, cough, sore throat, pain or difficulty passing urine  signs and symptoms of liver injury like dark yellow or brown urine; general ill feeling  or flu-like symptoms; light-colored stools; loss of appetite; nausea; right upper belly pain; unusually weak or tired; yellowing of the eyes or skin  swelling of the ankles, feet, hands  unusually slow heartbeat Side effects that usually do not require medical attention (report to your doctor or health care professional if they continue or are bothersome):  diarrhea  hair loss  loss of appetite  muscle or joint pain  nausea, vomiting  pain, redness, or irritation at site where injected  tiredness This list may not describe all possible side effects. Call your doctor for medical advice about side effects. You may report side effects to FDA at 1-800-FDA-1088. Where should I keep my medicine? This drug is given in a hospital or clinic and will not be stored at home. NOTE: This sheet is a summary. It may not cover all possible information. If you have questions about this medicine, talk to your doctor, pharmacist, or health care provider.  2021 [employer] Standard (2018-12-12 13:37:23)

## 2020-02-20 NOTE — Progress Notes (Signed)
URINE OUTPUT 800 CC TODAY CLEAR, PALE YELLOW

## 2020-02-25 ENCOUNTER — Encounter: Payer: Self-pay | Admitting: Hematology and Oncology

## 2020-02-25 ENCOUNTER — Other Ambulatory Visit: Payer: Self-pay | Admitting: Hematology and Oncology

## 2020-02-25 ENCOUNTER — Inpatient Hospital Stay: Payer: Medicare Other

## 2020-02-25 ENCOUNTER — Inpatient Hospital Stay: Payer: Medicare Other | Attending: Oncology | Admitting: Hematology and Oncology

## 2020-02-25 ENCOUNTER — Other Ambulatory Visit: Payer: Self-pay

## 2020-02-25 DIAGNOSIS — Z171 Estrogen receptor negative status [ER-]: Secondary | ICD-10-CM | POA: Diagnosis not present

## 2020-02-25 DIAGNOSIS — Z7952 Long term (current) use of systemic steroids: Secondary | ICD-10-CM | POA: Insufficient documentation

## 2020-02-25 DIAGNOSIS — Z800 Family history of malignant neoplasm of digestive organs: Secondary | ICD-10-CM | POA: Insufficient documentation

## 2020-02-25 DIAGNOSIS — Z803 Family history of malignant neoplasm of breast: Secondary | ICD-10-CM | POA: Insufficient documentation

## 2020-02-25 DIAGNOSIS — G4700 Insomnia, unspecified: Secondary | ICD-10-CM | POA: Insufficient documentation

## 2020-02-25 DIAGNOSIS — Z79899 Other long term (current) drug therapy: Secondary | ICD-10-CM | POA: Insufficient documentation

## 2020-02-25 DIAGNOSIS — C50411 Malignant neoplasm of upper-outer quadrant of right female breast: Secondary | ICD-10-CM | POA: Diagnosis not present

## 2020-02-25 DIAGNOSIS — Z5111 Encounter for antineoplastic chemotherapy: Secondary | ICD-10-CM | POA: Insufficient documentation

## 2020-02-25 DIAGNOSIS — Z8042 Family history of malignant neoplasm of prostate: Secondary | ICD-10-CM | POA: Insufficient documentation

## 2020-02-25 DIAGNOSIS — R5383 Other fatigue: Secondary | ICD-10-CM | POA: Insufficient documentation

## 2020-02-25 DIAGNOSIS — Z7982 Long term (current) use of aspirin: Secondary | ICD-10-CM | POA: Insufficient documentation

## 2020-02-25 DIAGNOSIS — G629 Polyneuropathy, unspecified: Secondary | ICD-10-CM | POA: Insufficient documentation

## 2020-02-25 DIAGNOSIS — Z8673 Personal history of transient ischemic attack (TIA), and cerebral infarction without residual deficits: Secondary | ICD-10-CM | POA: Insufficient documentation

## 2020-02-25 DIAGNOSIS — E876 Hypokalemia: Secondary | ICD-10-CM | POA: Insufficient documentation

## 2020-02-25 DIAGNOSIS — R110 Nausea: Secondary | ICD-10-CM | POA: Insufficient documentation

## 2020-02-25 DIAGNOSIS — R197 Diarrhea, unspecified: Secondary | ICD-10-CM | POA: Insufficient documentation

## 2020-02-25 DIAGNOSIS — C50911 Malignant neoplasm of unspecified site of right female breast: Secondary | ICD-10-CM | POA: Insufficient documentation

## 2020-02-25 DIAGNOSIS — Z5112 Encounter for antineoplastic immunotherapy: Secondary | ICD-10-CM | POA: Insufficient documentation

## 2020-02-25 LAB — HEPATIC FUNCTION PANEL
ALT: 40 — AB (ref 7–35)
AST: 37 — AB (ref 13–35)
Alkaline Phosphatase: 80 (ref 25–125)
Bilirubin, Total: 1.9

## 2020-02-25 LAB — CBC: RBC: 3.79 — AB (ref 3.87–5.11)

## 2020-02-25 LAB — MAGNESIUM: Magnesium: 1.7

## 2020-02-25 LAB — COMPREHENSIVE METABOLIC PANEL
Albumin: 3.6 (ref 3.5–5.0)
Calcium: 9.7 (ref 8.7–10.7)

## 2020-02-25 LAB — BASIC METABOLIC PANEL
BUN: 15 (ref 4–21)
CO2: 27 — AB (ref 13–22)
Chloride: 105 (ref 99–108)
Creatinine: 0.8 (ref 0.5–1.1)
Glucose: 205
Potassium: 3.9 (ref 3.4–5.3)
Sodium: 138 (ref 137–147)

## 2020-02-25 LAB — CBC AND DIFFERENTIAL
HCT: 36 (ref 36–46)
Hemoglobin: 11.6 — AB (ref 12.0–16.0)
Neutrophils Absolute: 2.08
Platelets: 209 (ref 150–399)
WBC: 2.7

## 2020-02-25 NOTE — Progress Notes (Signed)
[facility]  [address] [place],  [place]  [address] [phone]  Clinic Day:  02/25/2020  Referring physician: Guadalupe Maple, MD     CHIEF COMPLAINT:  CC: Invasive ductal carcinoma of the right breast  Current Treatment:  Weekly paclitaxel x 9 weeks with trastuzumab/pertuzumab every 3 weeks.  HISTORY OF PRESENT ILLNESS:  Brandy Jacobs is a 87 y.o. female referred by Dr. Orrin Brigham for the evaluation and treatment of invasive ductal carcinoma of the right breast.  This began when the patient felt a lump of the right breast, which quickly enlarged.  Diagnostic mammogram confirmed a highly suspicious 2.9 cm upper right breast mass.  No abnormal right axillary lymph nodes were seen and left breast was negative.  Ultrasound guided biopsy was pursued and surgical pathology from that procedure confirmed invasive ductal carcinoma, grade 2/3, and DCIS with central necrosis.  HER2 was positive, estrogen receptor was positive at 95% and progesterone receptor was positive at 30%.  Ki67 was 40%.  Normally, we would do a pretreatment MRI, but we don't know if this is necessary as the lesion is easily palpable and she would have musculoskeletal difficulties tolerating the position required.  As she is HER2 positive, we recommended neoadjuvant chemotherapy with paclitaxel weekly for 9 doses and Herceptin/ Perjeta every 3 weeks for a total of 1 year. She received her first cycle on January 4th.  She has a history of stroke in late August and still has some difficulty finding the right words and if she gets excited, she will become forgetful.  She also has several nodules of the thyroid, but biopsy was benign in November.  She has diabetes, but is not on medication. Day 8 of cycle 1 paclitaxel was held due to elevated bilirubin.  She received day 15 of cycle 1 of paclitaxel after the bilirubin nearly normalized.  She underwent a stereotactic biopsy of an area in the right  breast inferior to the tumor on January 24th to help decide about future surgery.  Pathology revealed atypical ductal hyperplasia. She started a 2nd cycle of paclitaxel/trastuzumab/pertuzumab last week.  We continue to see her weekly.  INTERVAL HISTORY:  [place] is here today prior to day 8 of cycle 2 of paclitaxel/ trastuzumab/ pertuzumab.   She reports intermittent dull right breast pain since her biopsy last week. She also reports pain in the right upper back since the procedure. She continues to report intermittent nausea and diarrhea, for which medication is effective.  She states she felt good yesterday, but then has been more fatigued today.  She had nausea again this morning, which resolved with Zofran. She denies fevers or chills.  She continues to report intermittent diarrhea, but states her stoosl have been more formed this past week.  She states her appetite is decreased, but she is working on increasing protein in her diet and eating several small meals a day. Her weight has decreased 1.3 pounds in 1 week.   REVIEW OF SYSTEMS:  Review of Systems  Constitutional: Positive for appetite change and unexpected weight change. Negative for chills, fatigue and fever.  HENT:   Negative for lump/mass, mouth sores, sore throat and trouble swallowing.   Eyes: Negative for icterus.  Respiratory: Negative for cough and shortness of breath.   Cardiovascular: Negative for chest pain and leg swelling.  Gastrointestinal: Positive for diarrhea. Negative for abdominal pain, constipation, nausea and vomiting.  Genitourinary: Positive for bladder incontinence (chronic, stable). Negative for difficulty urinating,  dysuria, frequency and hematuria.   Musculoskeletal: Negative for arthralgias, back pain and myalgias.  Skin: Positive for itching.  Neurological: Negative for dizziness and headaches.  Psychiatric/Behavioral: Negative for depression and sleep disturbance. The patient is not nervous/anxious.       VITALS:  Blood pressure (!) 146/65, pulse 88, temperature 98.3 F (36.8 C), temperature source Oral, resp. rate 18, height 5' 3.5" (1.613 m), weight 153 lb 11.2 oz (69.7 kg), SpO2 95 %.  Wt Readings from Last 3 Encounters:  02/25/20 153 lb 11.2 oz (69.7 kg)  02/18/20 155 lb 1.6 oz (70.4 kg)  02/13/20 154 lb 4 oz (70 kg)    Body mass index is 26.8 kg/m.  Performance status (ECOG): 1 - Symptomatic but completely ambulatory  PHYSICAL EXAM:  Physical Exam Vitals and nursing note reviewed.  Constitutional:      General: She is not in acute distress.    Appearance: Normal appearance. She is normal weight.  HENT:     Mouth/Throat:     Mouth: Mucous membranes are moist.     Pharynx: Oropharynx is clear. No oropharyngeal exudate or posterior oropharyngeal erythema.  Eyes:     General: No scleral icterus.    Extraocular Movements: Extraocular movements intact.     Conjunctiva/sclera: Conjunctivae normal.     Pupils: Pupils are equal, round, and reactive to light.  Cardiovascular:     Rate and Rhythm: Normal rate and regular rhythm.     Heart sounds: Normal heart sounds. No murmur heard. No friction rub. No gallop.   Pulmonary:     Effort: No respiratory distress.     Breath sounds: Normal breath sounds. No stridor. No wheezing, rhonchi or rales.  Chest:  Breasts:     Right: Mass (2-3cm right upper outer quadrant) present. No axillary adenopathy or supraclavicular adenopathy.     Left: Normal. No axillary adenopathy or supraclavicular adenopathy.    Abdominal:     General: There is no distension.     Palpations: Abdomen is soft. There is no hepatomegaly, splenomegaly or mass.     Tenderness: There is no abdominal tenderness. There is no guarding.     Hernia: No hernia is present.  Musculoskeletal:        General: Normal range of motion.     Cervical back: Normal range of motion and neck supple. No tenderness.     Right lower leg: No edema.     Left lower leg: No edema.   Lymphadenopathy:     Cervical: No cervical adenopathy.     Upper Body:     Right upper body: No supraclavicular or axillary adenopathy.     Left upper body: No supraclavicular or axillary adenopathy.     Lower Body: No right inguinal adenopathy. No left inguinal adenopathy.  Skin:    Coloration: Skin is not jaundiced.     Findings: Rash (scattered, erythematous macular, rash upper back and arms) present.  Neurological:     Mental Status: She is alert and oriented to person, place, and time.     Cranial Nerves: No cranial nerve deficit.  Psychiatric:        Behavior: Behavior normal.        Thought Content: Thought content normal.     LABS:   CBC Latest Ref Rng & Units 02/25/2020 02/18/2020 02/10/2020  WBC - 2.7 7.1 3.5  Hemoglobin 12.0 - 16.0 11.6(A) 11.9(A) 12.1  Hematocrit 36 - 46 36 36 37  Platelets 150 - 399 209 242  244   CMP Latest Ref Rng & Units 02/25/2020 02/18/2020 02/10/2020  BUN 4 - 21 15 22(A) 14  Creatinine 0.5 - 1.1 0.8 0.8 0.8  Sodium 137 - 147 138 139 140  Potassium 3.4 - 5.3 3.9 3.3(A) 4.1  Chloride 99 - 108 105 106 107  CO2 13 - 22 27(A) 26(A) 26(A)  Calcium 8.7 - 10.7 9.7 9.4 10.3  Alkaline Phos 25 - 125 80 97 94  AST 13 - 35 37(A) 24 26  ALT 7 - 35 40(A) 29 21    STUDIES:    HISTORY:   Past Medical History:  Diagnosis Date  . Arthritis    osteoarthritis  . Breast cancer ([place])   . Diabetes mellitus without complication ([place])    not on medication  . History of CVA (cerebrovascular accident)   . Hypertension   . Multiple thyroid nodules    benign    Past Surgical History:  Procedure Laterality Date  . BIOPSY THYROID     benign  . BREAST BIOPSY      Family History  Problem Relation Age of Onset  . Colon cancer Father        34s  . Breast cancer Sister        87-60  . Colon cancer Brother        41s  . Prostate cancer Brother        late 33s  . Breast cancer Half-Sister        late 12s early 17s    Social History:  reports that  she has never smoked. She has never used smokeless tobacco. She reports previous alcohol use. She reports that she does not use drugs.The patient is accompanied by her daughter, Marcelline Mates, today.    Allergies: No Known Allergies  Current Medications: Current Outpatient Medications  Medication Sig Dispense Refill  . acetaminophen (TYLENOL) 500 MG tablet Take 500 mg by mouth at bedtime.    Marland Kitchen aspirin EC 81 MG tablet Take 81 mg by mouth daily. Swallow whole.    Marland Kitchen atorvastatin (LIPITOR) 40 MG tablet Take 40 mg by mouth daily.    Marland Kitchen dexamethasone (DECADRON) 4 MG tablet Take 1 tablet (4 mg total) by mouth as directed. 3 pills night before and 3 pills morning of first chemo, then 1 pill night before and morning of weekly chemo 30 tablet 2  . losartan (COZAAR) 50 MG tablet Take 50 mg by mouth daily.    . ondansetron (ZOFRAN) 4 MG tablet Take 1 tablet (4 mg total) by mouth every 4 (four) hours as needed for nausea or vomiting. 30 tablet 5  . oxyCODONE (OXY IR/ROXICODONE) 5 MG immediate release tablet SMARTSIG:1 Tablet(s) By Mouth 4-5 Times Daily    . predniSONE (DELTASONE) 5 MG tablet Take 2.5 mg by mouth 2 (two) times a week. TAKES 1/2 TABLET OF 5MG TWICE A WEEK    . prochlorperazine (COMPAZINE) 10 MG tablet Take 1 tablet (10 mg total) by mouth every 6 (six) hours as needed for nausea or vomiting. 30 tablet 5  . Vitamin D3 (VITAMIN D) 25 MCG tablet Take 1,000 Units by mouth daily.     No current facility-administered medications for this visit.     ASSESSMENT & PLAN:   Assessment:   1. 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  She is receiving neoadjuvant paclitaxel/trastuzumab/pertuzumab.  Day 8 of cycle 1 of paclitaxel was held due  to elevated bilirubin.  She received day 15 of cycle 1. The bilirubin remains just mildly elevated, so we will proceed with paclitaxe this week and continue to monitor it.  Additional biopsy of  the right breast to help with surgical decision making revealed atypical ductal hyperplasia. 2. History of stroke in late August 2021.  She only has residual difficulty with finding the correct words when speaking, and minor short term memory loss when she becomes excitable.  This is stable. 3. Multiple thyroid nodules, benign on biopsy from November 2021. 4. Chronic, mild hypercalcemia, which has been stable. She has been evaluated by an endocrinologist without specific etiology.  More recently , her calcium has been normal. 5. Diarrhea secondary to treatment, which is controlled with medication. 6. Nausea due to treatment, controlled with medications.  Plan:  We will proceed with day 8 of cycle 2 of paclitaxel and this week.  We will plan to see her back in one week with a CBC and comprehensive metabolic panel prior to day 15 of cycle 2 cycle of paclitaxel. The patient and her daughter understand the plans discussed today and are in agreement with them. They know to contact our office if she develops issues requiring immediate clinical assessment.    Marvia Pickles, PA-C [facility] AT [facility] [address] [place] [address] Dept: [phone] Dept Fax: [phone]

## 2020-02-27 ENCOUNTER — Other Ambulatory Visit: Payer: Self-pay

## 2020-02-27 ENCOUNTER — Inpatient Hospital Stay: Payer: Medicare Other

## 2020-02-27 VITALS — BP 119/72 | HR 80 | Temp 98.2°F | Resp 20 | Ht 63.5 in | Wt 156.8 lb

## 2020-02-27 DIAGNOSIS — Z7982 Long term (current) use of aspirin: Secondary | ICD-10-CM | POA: Diagnosis not present

## 2020-02-27 DIAGNOSIS — Z5111 Encounter for antineoplastic chemotherapy: Secondary | ICD-10-CM | POA: Diagnosis present

## 2020-02-27 DIAGNOSIS — R197 Diarrhea, unspecified: Secondary | ICD-10-CM | POA: Diagnosis not present

## 2020-02-27 DIAGNOSIS — E876 Hypokalemia: Secondary | ICD-10-CM | POA: Diagnosis not present

## 2020-02-27 DIAGNOSIS — C50911 Malignant neoplasm of unspecified site of right female breast: Secondary | ICD-10-CM | POA: Diagnosis present

## 2020-02-27 DIAGNOSIS — Z803 Family history of malignant neoplasm of breast: Secondary | ICD-10-CM | POA: Diagnosis not present

## 2020-02-27 DIAGNOSIS — G629 Polyneuropathy, unspecified: Secondary | ICD-10-CM | POA: Diagnosis not present

## 2020-02-27 DIAGNOSIS — C50411 Malignant neoplasm of upper-outer quadrant of right female breast: Secondary | ICD-10-CM

## 2020-02-27 DIAGNOSIS — Z5112 Encounter for antineoplastic immunotherapy: Secondary | ICD-10-CM | POA: Diagnosis present

## 2020-02-27 DIAGNOSIS — G4700 Insomnia, unspecified: Secondary | ICD-10-CM | POA: Diagnosis not present

## 2020-02-27 DIAGNOSIS — R5383 Other fatigue: Secondary | ICD-10-CM | POA: Diagnosis not present

## 2020-02-27 DIAGNOSIS — Z800 Family history of malignant neoplasm of digestive organs: Secondary | ICD-10-CM | POA: Diagnosis not present

## 2020-02-27 DIAGNOSIS — Z8673 Personal history of transient ischemic attack (TIA), and cerebral infarction without residual deficits: Secondary | ICD-10-CM | POA: Diagnosis not present

## 2020-02-27 DIAGNOSIS — R110 Nausea: Secondary | ICD-10-CM | POA: Diagnosis not present

## 2020-02-27 DIAGNOSIS — Z8042 Family history of malignant neoplasm of prostate: Secondary | ICD-10-CM | POA: Diagnosis not present

## 2020-02-27 DIAGNOSIS — Z170 Estrogen receptor positive status [ER+]: Secondary | ICD-10-CM

## 2020-02-27 DIAGNOSIS — Z79899 Other long term (current) drug therapy: Secondary | ICD-10-CM | POA: Diagnosis not present

## 2020-02-27 DIAGNOSIS — Z7952 Long term (current) use of systemic steroids: Secondary | ICD-10-CM | POA: Diagnosis not present

## 2020-02-27 MED ORDER — SODIUM CHLORIDE 0.9 % IV SOLN
Freq: Once | INTRAVENOUS | Status: AC
  Filled 2020-02-27: qty 250

## 2020-02-27 MED ORDER — SODIUM CHLORIDE 0.9 % IV SOLN
62.4000 mg/m2 | Freq: Once | INTRAVENOUS | Status: AC
  Administered 2020-02-27: 114 mg via INTRAVENOUS
  Filled 2020-02-27: qty 19

## 2020-02-27 MED ORDER — SODIUM CHLORIDE 0.9 % IV SOLN
10.0000 mg | Freq: Once | INTRAVENOUS | Status: AC
  Administered 2020-02-27: 10 mg via INTRAVENOUS
  Filled 2020-02-27: qty 10

## 2020-02-27 MED ORDER — FAMOTIDINE IN NACL 20-0.9 MG/50ML-% IV SOLN
20.0000 mg | Freq: Once | INTRAVENOUS | Status: AC
  Administered 2020-02-27: 20 mg via INTRAVENOUS

## 2020-02-27 MED ORDER — DIPHENHYDRAMINE HCL 50 MG/ML IJ SOLN
25.0000 mg | Freq: Once | INTRAMUSCULAR | Status: AC
  Administered 2020-02-27: 25 mg via INTRAVENOUS

## 2020-02-27 MED ORDER — FAMOTIDINE IN NACL 20-0.9 MG/50ML-% IV SOLN
INTRAVENOUS | Status: AC
  Filled 2020-02-27: qty 50

## 2020-02-27 MED ORDER — DIPHENHYDRAMINE HCL 50 MG/ML IJ SOLN
INTRAMUSCULAR | Status: AC
  Filled 2020-02-27: qty 1

## 2020-02-27 MED ORDER — HEPARIN SOD (PORK) LOCK FLUSH 100 UNIT/ML IV SOLN
500.0000 [IU] | Freq: Once | INTRAVENOUS | Status: AC | PRN
  Administered 2020-02-27: 500 [IU]
  Filled 2020-02-27: qty 5

## 2020-02-27 NOTE — Patient Instructions (Signed)
[facility] Discharge Instructions for Patients Receiving Chemotherapy  Today you received the following chemotherapy agents Paclitaxel  To help prevent nausea and vomiting after your treatment, we encourage you to take your nausea medication as directed.   If you develop nausea and vomiting that is not controlled by your nausea medication, call the clinic.   BELOW ARE SYMPTOMS THAT SHOULD BE REPORTED IMMEDIATELY:  *FEVER GREATER THAN 100.5 F  *CHILLS WITH OR WITHOUT FEVER  NAUSEA AND VOMITING THAT IS NOT CONTROLLED WITH YOUR NAUSEA MEDICATION  *UNUSUAL SHORTNESS OF BREATH  *UNUSUAL BRUISING OR BLEEDING  TENDERNESS IN MOUTH AND THROAT WITH OR WITHOUT PRESENCE OF ULCERS  *URINARY PROBLEMS  *BOWEL PROBLEMS  UNUSUAL RASH Items with * indicate a potential emergency and should be followed up as soon as possible.  Feel free to call the clinic should you have any questions or concerns at The clinic phone number is [phone].  Please show the CHEMO ALERT CARD at check-in to the Emergency Department and triage nurse.   

## 2020-02-27 NOTE — Progress Notes (Signed)
1332:PT STABLE AT TIME OF DISCHARGE  

## 2020-03-02 NOTE — Progress Notes (Signed)
[facility]  [address] [place],  [name]  [address] [phone]  Clinic Day:  03/03/2020  Referring physician: [name], MD   This document serves as a record of services personally performed by Brandy Pray, MD. It was created on their behalf by [facility], a trained medical scribe. The creation of this record is based on the scribe's personal observations and the provider's statements to them.   CHIEF COMPLAINT:  CC: Invasive ductal carcinoma of the right breast  Current Treatment:  Weekly paclitaxel x 9 weeks with trastuzumab/pertuzumab every 3 weeks.  HISTORY OF PRESENT ILLNESS:  Brandy Jacobs is a 87 y.o. female with invasive ductal carcinoma of the right breast, diagnosed in December 2021.  This began when the patient felt a lump of the right breast, which quickly enlarged.  Diagnostic mammogram confirmed a highly suspicious 2.9 cm upper right breast mass.  No abnormal right axillary lymph nodes were seen and left breast was negative.  Ultrasound guided biopsy was pursued and surgical pathology from that procedure confirmed invasive ductal carcinoma, grade 2/3, and DCIS with central necrosis.  HER2 was positive, estrogen receptor was positive at 95% and progesterone receptor was positive at 30%.  Ki67 was 40%.  Normally, we would do a pretreatment MRI, but we don't know if this is necessary as the lesion is easily palpable and she would have musculoskeletal difficulties tolerating the position required.  As she is HER2 positive, we recommended neoadjuvant chemotherapy with paclitaxel weekly for 9 doses and Herceptin/ Perjeta every 3 weeks for a total of 1 year. She received her first cycle on January 4th.  She has a history of stroke in late August and still has some difficulty finding the right words and if she gets excited, she will become forgetful.  She also has several nodules of the thyroid, but biopsy was benign in November.  She  has diabetes, but is not on medication. Day 8 of cycle 1 paclitaxel was held due to elevated bilirubin.  She received day 15 of cycle 1 of paclitaxel after the bilirubin nearly normalized.  She underwent a stereotactic biopsy of an area in the right breast inferior to the tumor on January 24th to help decide about future surgery.  Pathology revealed atypical ductal hyperplasia. We continue to see her weekly.  INTERVAL HISTORY:  Brandy Jacobs is here for routine follow up prior to day 15 cycle 2 of paclitaxel/ trastuzumab/ pertuzumab.  She continues to struggle with nausea and diarrhea.  She has Compazine and Zofran to use as needed, and has been using Imodium to control her diarrhea.  She does eat well when she feels good.  She has right rib pain, which she is unable to rate.  Her white count is fairly stable at 2.5 with an ANC of 1680, her hemoglobin is also fairly stable at 11.4 and platelets are normal.  Chemistries are unremarkable except for a potassium of 3.4, and a total bilirubin of 2.0, which has been chronically elevated.  I will plan to check a UGT1A1 at her next appointment.  Her  appetite is intermittent, and she has lost 3 more pounds since her last visit.  She denies fever, chills or other signs of infection.  She denies vomiting or abdominal pain.  She denies sore throat, cough, dyspnea, or chest pain.   REVIEW OF SYSTEMS:  Review of Systems  Constitutional: Positive for fatigue.  HENT:  Negative.   Eyes: Negative.   Respiratory: Negative.  Cardiovascular: Negative.   Gastrointestinal: Positive for diarrhea and nausea.  Endocrine: Negative.   Genitourinary: Negative.    Musculoskeletal:       Right rib pain  Skin: Negative.   Neurological: Negative.   Hematological: Negative.   Psychiatric/Behavioral: Negative.      VITALS:  Blood pressure 134/62, pulse 82, temperature 98.7 F (37.1 C), temperature source Oral, resp. rate 18, height 5' 3.5" (1.613 m), weight 153 lb (69.4 kg), SpO2  96 %.  Wt Readings from Last 3 Encounters:  03/03/20 153 lb (69.4 kg)  02/27/20 156 lb 12 oz (71.1 kg)  02/25/20 153 lb 11.2 oz (69.7 kg)    Body mass index is 26.68 kg/m.  Performance status (ECOG): 1 - Symptomatic but completely ambulatory  PHYSICAL EXAM:  Physical Exam Constitutional:      General: She is not in acute distress.    Appearance: Normal appearance. She is normal weight.  HENT:     Head: Normocephalic and atraumatic.  Eyes:     General: No scleral icterus.    Extraocular Movements: Extraocular movements intact.     Conjunctiva/sclera: Conjunctivae normal.     Pupils: Pupils are equal, round, and reactive to light.  Cardiovascular:     Rate and Rhythm: Normal rate and regular rhythm.     Pulses: Normal pulses.     Heart sounds: Normal heart sounds. No murmur heard. No friction rub. No gallop.   Pulmonary:     Effort: Pulmonary effort is normal. No respiratory distress.     Breath sounds: Normal breath sounds.  Chest:     Comments: I can palpate a soft firmness measuring 1-2 cm of the upper right breast Abdominal:     General: Bowel sounds are normal. There is no distension.     Palpations: Abdomen is soft. There is no mass.     Tenderness: There is no abdominal tenderness.  Musculoskeletal:        General: Normal range of motion.     Cervical back: Normal range of motion and neck supple.     Right lower leg: No edema.     Left lower leg: No edema.  Lymphadenopathy:     Cervical: No cervical adenopathy.  Skin:    General: Skin is warm and dry.     Comments: Skin turgor is decreased  Neurological:     General: No focal deficit present.     Mental Status: She is alert and oriented to person, place, and time. Mental status is at baseline.  Psychiatric:        Mood and Affect: Mood normal.        Behavior: Behavior normal.        Thought Content: Thought content normal.        Judgment: Judgment normal.     LABS:   CBC Latest Ref Rng & Units  02/25/2020 02/18/2020 02/10/2020  WBC - 2.7 7.1 3.5  Hemoglobin 12.0 - 16.0 11.6(A) 11.9(A) 12.1  Hematocrit 36 - 46 36 36 37  Platelets 150 - 399 209 242 244   CMP Latest Ref Rng & Units 02/25/2020 02/18/2020 02/10/2020  BUN 4 - 21 15 22(A) 14  Creatinine 0.5 - 1.1 0.8 0.8 0.8  Sodium 137 - 147 138 139 140  Potassium 3.4 - 5.3 3.9 3.3(A) 4.1  Chloride 99 - 108 105 106 107  CO2 13 - 22 27(A) 26(A) 26(A)  Calcium 8.7 - 10.7 9.7 9.4 10.3  Alkaline Phos 25 - 125 80  97 94  AST 13 - 35 37(A) 24 26  ALT 7 - 35 40(A) 29 21    STUDIES:   No current studies  HISTORY:   Allergies: No Known Allergies  Current Medications: Current Outpatient Medications  Medication Sig Dispense Refill  . Loperamide HCl (IMODIUM PO) Take by mouth. Over the counter imodium, patient is taking    . acetaminophen (TYLENOL) 500 MG tablet Take 500 mg by mouth at bedtime.    Marland Kitchen aspirin EC 81 MG tablet Take 81 mg by mouth daily. Swallow whole.    Marland Kitchen atorvastatin (LIPITOR) 40 MG tablet Take 40 mg by mouth daily.    Marland Kitchen dexamethasone (DECADRON) 4 MG tablet Take 1 tablet (4 mg total) by mouth as directed. 3 pills night before and 3 pills morning of first chemo, then 1 pill night before and morning of weekly chemo 30 tablet 2  . losartan (COZAAR) 50 MG tablet Take 50 mg by mouth daily.    . ondansetron (ZOFRAN) 4 MG tablet Take 1 tablet (4 mg total) by mouth every 4 (four) hours as needed for nausea or vomiting. 30 tablet 5  . oxyCODONE (OXY IR/ROXICODONE) 5 MG immediate release tablet SMARTSIG:1 Tablet(s) By Mouth 4-5 Times Daily    . predniSONE (DELTASONE) 5 MG tablet Take 2.5 mg by mouth 2 (two) times a week. TAKES 1/2 TABLET OF 5MG TWICE A WEEK    . prochlorperazine (COMPAZINE) 10 MG tablet Take 1 tablet (10 mg total) by mouth every 6 (six) hours as needed for nausea or vomiting. 30 tablet 5  . Vitamin D3 (VITAMIN D) 25 MCG tablet Take 1,000 Units by mouth daily.     No current facility-administered medications for this  visit.     ASSESSMENT & PLAN:   Assessment:   1. 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  She is receiving neoadjuvant paclitaxel/trastuzumab/pertuzumab.  Day 8 of cycle 1 of paclitaxel was held due to elevated bilirubin.  She received day 15 of cycle 1.  Additional biopsy of the right breast to help with surgical decision making revealed atypical ductal hyperplasia.  She is responding to treatment by the decrease in her right breast mass.    2. History of stroke in late August 2021.  She only has residual difficulty with finding the correct words when speaking, and minor short term memory loss when she becomes excitable.  This is stable.  3. Multiple thyroid nodules, benign on biopsy from November 2021.  4. Chronic, mild hypercalcemia, which has been stable. She has been evaluated by an endocrinologist without specific etiology.  More recently , her calcium has been normal.  5. Diarrhea secondary to treatment, which is controlled with medication.  6. Nausea due to treatment.  She has antiemetics to use as needed.  7.  Chronic elevation of the bilirubin.  I will plan to check a UGT1A1 for further evaluation.  8.  Reflux due to chemotherapy.  I will order omeprazole 40 mg daily.  Plan:  As she continues to struggle with toxicities and her blood counts have dropped, we will skip day 15 of cycle 2 of paclitaxel on February 10th.  This would give her some time to recover, and if she is able to resume we will plan to lower the dose by another 10%.  I will order omeprazole 40 mg daily to try and improve some of her symptoms.  She has Zofran and Compazine to use for nausea, and Imodium has been effective for her diarrhea.  We will plan to see her back in one week with a CBC, comprehensive metabolic panel and [id number] for reassessment to possibly start cycle 3 of paclitaxel.  We plan three more  doses and then reassessment for lumpectomy.  The patient and her daughter understand the plans discussed today and are in agreement with them. They know to contact our office if she develops issues requiring immediate clinical assessment.    Derwood Kaplan, MD [facility] AT [facility] [address] [place] [address] Dept: [phone] Dept Fax: [phone]   I, Rita Ohara, am acting as scribe for Derwood Kaplan, MD  I have reviewed this report as typed by the medical scribe, and it is complete and accurate.

## 2020-03-03 ENCOUNTER — Other Ambulatory Visit: Payer: Self-pay | Admitting: Hematology and Oncology

## 2020-03-03 ENCOUNTER — Encounter: Payer: Self-pay | Admitting: Oncology

## 2020-03-03 ENCOUNTER — Other Ambulatory Visit: Payer: Self-pay | Admitting: Oncology

## 2020-03-03 ENCOUNTER — Inpatient Hospital Stay (INDEPENDENT_AMBULATORY_CARE_PROVIDER_SITE_OTHER): Payer: Medicare Other | Admitting: Oncology

## 2020-03-03 ENCOUNTER — Other Ambulatory Visit: Payer: Self-pay

## 2020-03-03 ENCOUNTER — Inpatient Hospital Stay: Payer: Medicare Other

## 2020-03-03 VITALS — BP 134/62 | HR 82 | Temp 98.7°F | Resp 18 | Ht 63.5 in | Wt 153.0 lb

## 2020-03-03 DIAGNOSIS — Z171 Estrogen receptor negative status [ER-]: Secondary | ICD-10-CM

## 2020-03-03 DIAGNOSIS — C50411 Malignant neoplasm of upper-outer quadrant of right female breast: Secondary | ICD-10-CM

## 2020-03-03 LAB — HEPATIC FUNCTION PANEL
ALT: 32 (ref 7–35)
AST: 25 (ref 13–35)
Alkaline Phosphatase: 83 (ref 25–125)
Bilirubin, Total: 2

## 2020-03-03 LAB — COMPREHENSIVE METABOLIC PANEL
Albumin: 3.6 (ref 3.5–5.0)
Calcium: 8.9 (ref 8.7–10.7)

## 2020-03-03 LAB — CBC
MCV: 93 (ref 81–99)
RBC: 3.64 — AB (ref 3.87–5.11)

## 2020-03-03 LAB — BASIC METABOLIC PANEL
BUN: 14 (ref 4–21)
CO2: 28 — AB (ref 13–22)
Chloride: 103 (ref 99–108)
Creatinine: 0.7 (ref 0.5–1.1)
Glucose: 153
Potassium: 3.4 (ref 3.4–5.3)
Sodium: 135 — AB (ref 137–147)

## 2020-03-03 LAB — CBC AND DIFFERENTIAL
HCT: 34 — AB (ref 36–46)
Hemoglobin: 11.4 — AB (ref 12.0–16.0)
Neutrophils Absolute: 1.68
Platelets: 206 (ref 150–399)
WBC: 2.5

## 2020-03-03 MED ORDER — OMEPRAZOLE 40 MG PO CPDR: 40.0000 mg | DELAYED_RELEASE_CAPSULE | Freq: Every day | ORAL | 3 refills | Status: DC

## 2020-03-05 ENCOUNTER — Inpatient Hospital Stay: Payer: Medicare Other

## 2020-03-10 ENCOUNTER — Inpatient Hospital Stay: Payer: Medicare Other

## 2020-03-10 ENCOUNTER — Other Ambulatory Visit: Payer: Self-pay | Admitting: Hematology and Oncology

## 2020-03-10 ENCOUNTER — Encounter: Payer: Self-pay | Admitting: Hematology and Oncology

## 2020-03-10 ENCOUNTER — Inpatient Hospital Stay (INDEPENDENT_AMBULATORY_CARE_PROVIDER_SITE_OTHER): Payer: Medicare Other | Admitting: Hematology and Oncology

## 2020-03-10 ENCOUNTER — Other Ambulatory Visit: Payer: Self-pay

## 2020-03-10 VITALS — BP 138/65 | HR 84 | Temp 98.2°F | Resp 18 | Ht 63.5 in | Wt 154.3 lb

## 2020-03-10 DIAGNOSIS — Z171 Estrogen receptor negative status [ER-]: Secondary | ICD-10-CM

## 2020-03-10 DIAGNOSIS — C50411 Malignant neoplasm of upper-outer quadrant of right female breast: Secondary | ICD-10-CM

## 2020-03-10 LAB — CBC AND DIFFERENTIAL
HCT: 31 — AB (ref 36–46)
Hemoglobin: 10.5 — AB (ref 12.0–16.0)
Neutrophils Absolute: 2.82
Platelets: 234 (ref 150–399)
WBC: 4.4

## 2020-03-10 LAB — PROTEIN, TOTAL: Total Protein: 6.2 g/dL — AB (ref 6.3–8.2)

## 2020-03-10 LAB — COMPREHENSIVE METABOLIC PANEL
Albumin: 3.4 — AB (ref 3.5–5.0)
Calcium: 8.3 — AB (ref 8.7–10.7)

## 2020-03-10 LAB — HEPATIC FUNCTION PANEL
ALT: 20 (ref 7–35)
AST: 18 (ref 13–35)
Alkaline Phosphatase: 74 (ref 25–125)
Bilirubin, Total: 1.1

## 2020-03-10 LAB — BASIC METABOLIC PANEL
BUN: 9 (ref 4–21)
CO2: 29 — AB (ref 13–22)
Chloride: 103 (ref 99–108)
Creatinine: 0.7 (ref 0.5–1.1)
Glucose: 215
Potassium: 3 — AB (ref 3.4–5.3)
Sodium: 138 (ref 137–147)

## 2020-03-10 LAB — CBC
MCV: 93 (ref 81–99)
RBC: 3.39 — AB (ref 3.87–5.11)

## 2020-03-10 LAB — CORRECTED CALCIUM (CC13): Calcium, Corrected: 8.9

## 2020-03-10 NOTE — Progress Notes (Signed)
[facility]  [address] [place],  [place]  [address] [phone]  Clinic Day:  03/10/2020  Referring physician: Guadalupe Maple, MD     CHIEF COMPLAINT:  CC: An 87 year old with Invasive ductal carcinoma of the right breast here for 3 week pre-chemotherapy evaluation.  Current Treatment:  Weekly paclitaxel x 9 weeks with trastuzumab/pertuzumab every 3 weeks.  HISTORY OF PRESENT ILLNESS:  Brandy Jacobs is a 87 y.o. female with invasive ductal carcinoma of the right breast, diagnosed in December 2021.Ultrasound guided biopsy was pursued and surgical pathology from that procedure confirmed invasive ductal carcinoma, grade 2/3, and DCIS with central necrosis.  HER2 was positive, estrogen receptor was positive at 95% and progesterone receptor was positive at 30%.  Ki67 was 40%.  As Brandy Jacobs is HER2 positive, we recommended neoadjuvant chemotherapy with paclitaxel weekly for 9 doses and Herceptin/ Perjeta every 3 weeks for a total of 1 year. Brandy Jacobs received her first cycle on January 4th. Brandy Jacobs underwent a stereotactic biopsy of an area in the right breast inferior to the tumor on January 24th to help decide about future surgery.  Pathology revealed atypical ductal hyperplasia.   INTERVAL HISTORY:  Brandy Jacobs is here for pre-chemotherapy evalaution prior to day 1 cycle 3 of paclitaxel/ trastuzumab/ pertuzumab. Brandy Jacobs has had several days of nausea without vomiting. This is improving with compazine and her last episode was yesterday. Brandy Jacobs has also had several days of diarrhea for which Brandy Jacobs was using imodium and has seen some improvement. Brandy Jacobs notes lower abdominal pain associated with the diarrhea. Brandy Jacobs is not sleeping well and complains of fatigue as well as pain in her lower extremities. Brandy Jacobs does note some neuropathy in both hands as well. Brandy Jacobs is weak in general and feels poorly. CBC today reveals a decrease in her hemoglobin from 11.4 at last visit to 10.5 today. ANC and platelets  are normal. CMP today reveals potassium 3.0 today. Brandy Jacobs reports shortness of breath with exertion. Brandy Jacobs denies cough or chest pain.   REVIEW OF SYSTEMS:  Review of Systems  Constitutional: Positive for fatigue. Negative for appetite change, chills, diaphoresis, fever and unexpected weight change.  HENT:   Negative for hearing loss, lump/mass, mouth sores, nosebleeds, sore throat, tinnitus, trouble swallowing and voice change.   Eyes: Negative for eye problems and icterus.  Respiratory: Positive for shortness of breath. Negative for chest tightness, cough, hemoptysis and wheezing.   Cardiovascular: Positive for leg swelling. Negative for chest pain and palpitations.  Gastrointestinal: Positive for abdominal pain, diarrhea and nausea. Negative for abdominal distention, blood in stool, constipation, rectal pain and vomiting.  Endocrine: Negative for hot flashes.  Genitourinary: Negative for bladder incontinence, difficulty urinating, dyspareunia, dysuria, frequency, hematuria and nocturia.   Musculoskeletal: Positive for arthralgias. Negative for back pain, flank pain, gait problem, myalgias, neck pain and neck stiffness.  Skin: Negative for itching, rash and wound.  Neurological: Negative for dizziness, extremity weakness, gait problem, headaches, light-headedness, numbness, seizures and speech difficulty.       Increasing neuropathy  Hematological: Negative for adenopathy. Does not bruise/bleed easily.  Psychiatric/Behavioral: Positive for sleep disturbance. Negative for confusion, decreased concentration, depression and suicidal ideas. The patient is not nervous/anxious.      VITALS:  Blood pressure 138/65, pulse 84, temperature 98.2 F (36.8 C), temperature source Oral, resp. rate 18, height 5' 3.5" (1.613 m), weight 154 lb 4.8 oz (70 kg), SpO2 96 %.  Wt Readings from Last 3 Encounters:  03/10/20 154 lb  4.8 oz (70 kg)  03/03/20 153 lb (69.4 kg)  02/27/20 156 lb 12 oz (71.1 kg)    Body mass  index is 26.9 kg/m.  Performance status (ECOG): 1 - Symptomatic but completely ambulatory  PHYSICAL EXAM:  Physical Exam Vitals and nursing note reviewed.  Constitutional:      General: Brandy Jacobs is not in acute distress.    Appearance: Brandy Jacobs is normal weight. Brandy Jacobs is ill-appearing. Brandy Jacobs is not toxic-appearing or diaphoretic.  HENT:     Head: Normocephalic and atraumatic.     Nose: Nose normal. No congestion or rhinorrhea.     Mouth/Throat:     Mouth: Mucous membranes are moist.     Pharynx: Oropharynx is clear. No oropharyngeal exudate or posterior oropharyngeal erythema.  Eyes:     General: No scleral icterus.       Right eye: No discharge.        Left eye: No discharge.     Extraocular Movements: Extraocular movements intact.     Conjunctiva/sclera: Conjunctivae normal.     Pupils: Pupils are equal, round, and reactive to light.  Neck:     Vascular: No carotid bruit.  Cardiovascular:     Rate and Rhythm: Normal rate and regular rhythm.     Heart sounds: No murmur heard. No friction rub. No gallop.   Pulmonary:     Effort: Pulmonary effort is normal. No respiratory distress.     Breath sounds: Normal breath sounds. No stridor. No wheezing, rhonchi or rales.  Chest:     Chest wall: No tenderness.  Abdominal:     General: Abdomen is flat. Bowel sounds are normal. There is no distension.     Palpations: There is no mass.     Tenderness: There is no abdominal tenderness. There is no right CVA tenderness, left CVA tenderness, guarding or rebound.     Hernia: No hernia is present.  Musculoskeletal:        General: No swelling, tenderness, deformity or signs of injury. Normal range of motion.     Cervical back: Normal range of motion and neck supple. No rigidity or tenderness.     Right lower leg: Edema present.     Left lower leg: Edema present.  Lymphadenopathy:     Cervical: No cervical adenopathy.  Skin:    General: Skin is warm and dry.     Capillary Refill: Capillary refill  takes less than 2 seconds.     Coloration: Skin is not jaundiced or pale.     Findings: No bruising, erythema, lesion or rash.  Neurological:     General: No focal deficit present.     Mental Status: Brandy Jacobs is alert and oriented to person, place, and time. Mental status is at baseline.     Cranial Nerves: No cranial nerve deficit.     Sensory: No sensory deficit.     Motor: No weakness.     Coordination: Coordination normal.     Gait: Gait normal.     Deep Tendon Reflexes: Reflexes normal.  Psychiatric:        Mood and Affect: Mood normal.        Behavior: Behavior normal.        Thought Content: Thought content normal.        Judgment: Judgment normal.     LABS:   CBC Latest Ref Rng & Units 03/10/2020 03/03/2020 02/25/2020  WBC - 4.4 2.5 2.7  Hemoglobin 12.0 - 16.0 10.5(A) 11.4(A) 11.6(A)  Hematocrit 36 -  46 31(A) 34(A) 36  Platelets 150 - 399 234 206 209   CMP Latest Ref Rng & Units 03/10/2020 03/03/2020 02/25/2020  BUN 4 - _0 Creatinine 0.5 - 1.1 0.7 0.7 0.8  Sodium 137 - 147 138 135(A) 138  Potassium 3.4 - 5.3 3.0(A) 3.4 3.9  Chloride 99 - 108 103 103 105  CO2 13 - 22 29(A) 28(A) 27(A)  Calcium 8.7 - 10.7 8.3(A) 8.9 9.7  Total Protein 6.3 - 8.2 g/dL 6.2(A) - -  Alkaline Phos 25 - 125 74 83 80  AST 13 - 35 18 25 37(A)  ALT 7 - 35 20 32 40(A)    STUDIES:   No current studies  HISTORY:   Allergies: No Known Allergies  Current Medications: Current Outpatient Medications  Medication Sig Dispense Refill  . acetaminophen (TYLENOL) 500 MG tablet Take 500 mg by mouth at bedtime.    Marland Kitchen aspirin EC 81 MG tablet Take 81 mg by mouth daily. Swallow whole.    Marland Kitchen atorvastatin (LIPITOR) 40 MG tablet Take 40 mg by mouth daily.    Marland Kitchen dexamethasone (DECADRON) 4 MG tablet Take 1 tablet (4 mg total) by mouth as directed. 3 pills night before and 3 pills morning of first chemo, then 1 pill night before and morning of weekly chemo 30 tablet 2  . Loperamide HCl (IMODIUM PO) Take by mouth.  Over the counter imodium, patient is taking    . losartan (COZAAR) 50 MG tablet Take 50 mg by mouth daily.    Marland Kitchen omeprazole (PRILOSEC) 40 MG capsule Take 1 capsule (40 mg total) by mouth daily. 30 capsule 3  . ondansetron (ZOFRAN) 4 MG tablet Take 1 tablet (4 mg total) by mouth every 4 (four) hours as needed for nausea or vomiting. 30 tablet 5  . oxyCODONE (OXY IR/ROXICODONE) 5 MG immediate release tablet SMARTSIG:1 Tablet(s) By Mouth 4-5 Times Daily    . predniSONE (DELTASONE) 5 MG tablet Take 2.5 mg by mouth 2 (two) times a week. TAKES 1/2 TABLET OF 5MG TWICE A WEEK    . prochlorperazine (COMPAZINE) 10 MG tablet Take 1 tablet (10 mg total) by mouth every 6 (six) hours as needed for nausea or vomiting. 30 tablet 5  . Vitamin D3 (VITAMIN D) 25 MCG tablet Take 1,000 Units by mouth daily.     No current facility-administered medications for this visit.     ASSESSMENT & PLAN:   Assessment:   1. 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  Brandy Jacobs is receiving neoadjuvant paclitaxel/trastuzumab/pertuzumab.Brandy Jacobs is due cycle 3, day 1 this week. Brandy Jacobs is continuing to recover from toxicities including diarrhea, nausea, fatigue and generalized weakness.  2. Diarrhea secondary to treatment, which is improving with medication. Brandy Jacobs has not had an episode as of this morning.   3. Nausea due to treatment. This is improving with medication as well, with her last episode being last night.   4. Fatigue and insomnia.  5. Neuropathy. Worsening in all extremities.  6.Hypokalemia due to diarrhea.   Plan:  We will delay cycle 3, day 1 for this week to allow for recovery from toxicities. Brandy Jacobs is anxious to continue treatment, but admits Brandy Jacobs feels poorly today and doesn't want to feel worse. Brandy Jacobs will continue with imodium as needed for diarrhea. Brandy Jacobs will continue with nausea meds, at least twice daily for the next  few days unless  Brandy Jacobs begins to require them more frequently again. We will hold off on prescribing anything for insomnia at this time due to her age and limited options. Hopefully, as her side effects improve, Brandy Jacobs will be able to rest better. Brandy Jacobs does not wish to take anything for her neuropathy at this time. We will replace potassium IV on Thursday with 20 mEq. Brandy Jacobs will keep her scheduled appointment next week with Dr. Hinton Rao for repeat CBC, CMP and evaluation.   Brandy Jacobs and her daughter verbalized understanding of and agreement to the plans discussed today. They know to call the office should any new questions or concerns arise.     Melodye Ped, NP [facility] [address] [place] [address] Dept: [phone] Dept Fax: [phone]

## 2020-03-11 ENCOUNTER — Other Ambulatory Visit: Payer: Self-pay | Admitting: Hematology and Oncology

## 2020-03-12 ENCOUNTER — Other Ambulatory Visit: Payer: Self-pay

## 2020-03-12 ENCOUNTER — Other Ambulatory Visit: Payer: Self-pay | Admitting: Hematology and Oncology

## 2020-03-12 ENCOUNTER — Inpatient Hospital Stay: Payer: Medicare Other

## 2020-03-12 VITALS — BP 140/58 | HR 71 | Temp 98.6°F | Resp 18 | Ht 63.5 in | Wt 154.2 lb

## 2020-03-12 DIAGNOSIS — C50411 Malignant neoplasm of upper-outer quadrant of right female breast: Secondary | ICD-10-CM

## 2020-03-12 DIAGNOSIS — Z5112 Encounter for antineoplastic immunotherapy: Secondary | ICD-10-CM | POA: Diagnosis not present

## 2020-03-12 DIAGNOSIS — Z171 Estrogen receptor negative status [ER-]: Secondary | ICD-10-CM

## 2020-03-12 MED ORDER — SODIUM CHLORIDE 0.9 % IV SOLN
Freq: Once | INTRAVENOUS | Status: AC
  Filled 2020-03-12: qty 250

## 2020-03-12 MED ORDER — SODIUM CHLORIDE 0.9% FLUSH
10.0000 mL | Freq: Once | INTRAVENOUS | Status: DC | PRN
  Filled 2020-03-12: qty 10

## 2020-03-12 MED ORDER — POTASSIUM CHLORIDE 10 MEQ/100ML IV SOLN
10.0000 meq | INTRAVENOUS | Status: AC
  Administered 2020-03-12 (×2): 10 meq via INTRAVENOUS

## 2020-03-12 MED ORDER — POTASSIUM CHLORIDE 10 MEQ/100ML IV SOLN
INTRAVENOUS | Status: AC
  Filled 2020-03-12: qty 200

## 2020-03-12 MED ORDER — HEPARIN SOD (PORK) LOCK FLUSH 100 UNIT/ML IV SOLN
500.0000 [IU] | Freq: Once | INTRAVENOUS | Status: AC | PRN
  Administered 2020-03-12: 500 [IU]
  Filled 2020-03-12: qty 5

## 2020-03-12 NOTE — Patient Instructions (Signed)
Hypokalemia Hypokalemia means that the amount of potassium in the blood is lower than normal. Potassium is a chemical (electrolyte) that helps regulate the amount of fluid in the body. It also stimulates muscle tightening (contraction) and helps nerves work properly. Normally, most of the body's potassium is inside cells, and only a very small amount is in the blood. Because the amount in the blood is so small, minor changes to potassium levels in the blood can be life-threatening. What are the causes? This condition may be caused by:  Antibiotic medicine.  Diarrhea or vomiting. Taking too much of a medicine that helps you have a bowel movement (laxative) can cause diarrhea and lead to hypokalemia.  Chronic kidney disease (CKD).  Medicines that help the body get rid of excess fluid (diuretics).  Eating disorders, such as bulimia.  Low magnesium levels in the body.  Sweating a lot. What are the signs or symptoms? Symptoms of this condition include:  Weakness.  Constipation.  Fatigue.  Muscle cramps.  Mental confusion.  Skipped heartbeats or irregular heartbeat (palpitations).  Tingling or numbness. How is this diagnosed? This condition is diagnosed with a blood test. How is this treated? This condition may be treated by:  Taking potassium supplements by mouth.  Adjusting the medicines that you take.  Eating more foods that contain a lot of potassium. If your potassium level is very low, you may need to get potassium through an IV and be monitored in the hospital. Follow these instructions at home:  Take over-the-counter and prescription medicines only as told by your health care provider. This includes vitamins and supplements.  Eat a healthy diet. A healthy diet includes fresh fruits and vegetables, whole grains, healthy fats, and lean proteins.  If instructed, eat more foods that contain a lot of potassium. This includes: ? Nuts, such as peanuts and  pistachios. ? Seeds, such as sunflower seeds and pumpkin seeds. ? Peas, lentils, and lima beans. ? Whole grain and bran cereals and breads. ? Fresh fruits and vegetables, such as apricots, avocado, bananas, cantaloupe, kiwi, oranges, tomatoes, asparagus, and potatoes. ? Orange juice. ? Tomato juice. ? Red meats. ? Yogurt.  Keep all follow-up visits as told by your health care provider. This is important.   Contact a health care provider if you:  Have weakness that gets worse.  Feel your heart pounding or racing.  Vomit.  Have diarrhea.  Have diabetes (diabetes mellitus) and you have trouble keeping your blood sugar (glucose) in your target range. Get help right away if you:  Have chest pain.  Have shortness of breath.  Have vomiting or diarrhea that lasts for more than 2 days.  Faint. Summary  Hypokalemia means that the amount of potassium in the blood is lower than normal.  This condition is diagnosed with a blood test.  Hypokalemia may be treated by taking potassium supplements, adjusting the medicines that you take, or eating more foods that are high in potassium.  If your potassium level is very low, you may need to get potassium through an IV and be monitored in the hospital. This information is not intended to replace advice given to you by your health care provider. Make sure you discuss any questions you have with your health care provider. Document Revised: 08/23/2017 Document Reviewed: 08/23/2017 Elsevier Patient Education  2021 [employer] Dehydration, Adult Dehydration is condition in which there is not enough water or other fluids in the body. This happens when a person loses more fluids than   he or she takes in. Important body parts cannot work right without the right amount of fluids. Any loss of fluids from the body can cause dehydration. Dehydration can be mild, worse, or very bad. It should be treated right away to keep it from getting very bad. What  are the causes? This condition may be caused by:  Conditions that cause loss of water or other fluids, such as: ? Watery poop (diarrhea). ? Vomiting. ? Sweating a lot. ? Peeing (urinating) a lot.  Not drinking enough fluids, especially when you: ? Are ill. ? Are doing things that take a lot of energy to do.  Other illnesses and conditions, such as fever or infection.  Certain medicines, such as medicines that take extra fluid out of the body (diuretics).  Lack of safe drinking water.  Not being able to get enough water and food. What increases the risk? The following factors may make you more likely to develop this condition:  Having a long-term (chronic) illness that has not been treated the right way, such as: ? Diabetes. ? Heart disease. ? Kidney disease.  Being 65 years of age or older.  Having a disability.  Living in a place that is high above the ground or sea (high in altitude). The thinner, dried air causes more fluid loss.  Doing exercises that put stress on your body for a long time. What are the signs or symptoms? Symptoms of dehydration depend on how bad it is. Mild or worse dehydration  Thirst.  Dry lips or dry mouth.  Feeling dizzy or light-headed, especially when you stand up from sitting.  Muscle cramps.  Your body making: ? Dark pee (urine). Pee may be the color of tea. ? Less pee than normal. ? Less tears than normal.  Headache. Very bad dehydration  Changes in skin. Skin may: ? Be cold to the touch (clammy). ? Be blotchy or pale. ? Not go back to normal right after you lightly pinch it and let it go.  Little or no tears, pee, or sweat.  Changes in vital signs, such as: ? Fast breathing. ? Low blood pressure. ? Weak pulse. ? Pulse that is more than 100 beats a minute when you are sitting still.  Other changes, such as: ? Feeling very thirsty. ? Eyes that look hollow (sunken). ? Cold hands and feet. ? Being mixed up  (confused). ? Being very tired (lethargic) or having trouble waking from sleep. ? Short-term weight loss. ? Loss of consciousness. How is this treated? Treatment for this condition depends on how bad it is. Treatment should start right away. Do not wait until your condition gets very bad. Very bad dehydration is an emergency. You will need to go to a hospital.  Mild or worse dehydration can be treated at home. You may be asked to: ? Drink more fluids. ? Drink an oral rehydration solution (ORS). This drink helps get the right amounts of fluids and salts and minerals in the blood (electrolytes).  Very bad dehydration can be treated: ? With fluids through an IV tube. ? By getting normal levels of salts and minerals in your blood. This is often done by giving salts and minerals through a tube. The tube is passed through your nose and into your stomach. ? By treating the root cause. Follow these instructions at home: Oral rehydration solution If told by your doctor, drink an ORS:  Make an ORS. Use instructions on the package.  Start by drinking small   amounts, about  cup (120 mL) every 5-10 minutes.  Slowly drink more until you have had the amount that your doctor said to have. Eating and drinking  Drink enough clear fluid to keep your pee pale yellow. If you were told to drink an ORS, finish the ORS first. Then, start slowly drinking other clear fluids. Drink fluids such as: ? Water. Do not drink only water. Doing that can make the salt (sodium) level in your body get too low. ? Water from ice chips you suck on. ? Fruit juice that you have added water to (diluted). ? Low-calorie sports drinks.  Eat foods that have the right amounts of salts and minerals, such as: ? Bananas. ? Oranges. ? Potatoes. ? Tomatoes. ? Spinach.  Do not drink alcohol.  Avoid: ? Drinks that have a lot of sugar. These include:  High-calorie sports drinks.  Fruit juice that you did not add water  to.  Soda.  Caffeine. ? Foods that are greasy or have a lot of fat or sugar.         General instructions  Take over-the-counter and prescription medicines only as told by your doctor.  Do not take salt tablets. Doing that can make the salt level in your body get too high.  Return to your normal activities as told by your doctor. Ask your doctor what activities are safe for you.  Keep all follow-up visits as told by your doctor. This is important. Contact a doctor if:  You have pain in your belly (abdomen) and the pain: ? Gets worse. ? Stays in one place.  You have a rash.  You have a stiff neck.  You get angry or annoyed (irritable) more easily than normal.  You are more tired or have a harder time waking than normal.  You feel: ? Weak or dizzy. ? Very thirsty. Get help right away if you have:  Any symptoms of very bad dehydration.  Symptoms of vomiting, such as: ? You cannot eat or drink without vomiting. ? Your vomiting gets worse or does not go away. ? Your vomit has blood or green stuff in it.  Symptoms that get worse with treatment.  A fever.  A very bad headache.  Problems with peeing or pooping (having a bowel movement), such as: ? Watery poop that gets worse or does not go away. ? Blood in your poop (stool). This may cause poop to look black and tarry. ? Not peeing in 6-8 hours. ? Peeing only a small amount of very dark pee in 6-8 hours.  Trouble breathing. These symptoms may be an emergency. Do not wait to see if the symptoms will go away. Get medical help right away. Call your local emergency services (911 in the [place] Do not drive yourself to the hospital. Summary  Dehydration is a condition in which there is not enough water or other fluids in the body. This happens when a person loses more fluids than he or she takes in.  Treatment for this condition depends on how bad it is. Treatment should be started right away. Do not wait until your  condition gets very bad.  Drink enough clear fluid to keep your pee pale yellow. If you were told to drink an oral rehydration solution (ORS), finish the ORS first. Then, start slowly drinking other clear fluids.  Take over-the-counter and prescription medicines only as told by your doctor.  Get help right away if you have any symptoms of very bad dehydration.   This information is not intended to replace advice given to you by your health care provider. Make sure you discuss any questions you have with your health care provider. Document Revised: 08/23/2018 Document Reviewed: 08/23/2018 Elsevier Patient Education  2021 [employer] Inc[employer]  

## 2020-03-12 NOTE — Progress Notes (Signed)
1352: PT STABLE AT TIME OF DISCHARGE

## 2020-03-13 NOTE — Progress Notes (Signed)
[place]  [address] [place],  [place]  [address] [phone]  Clinic Day:  03/17/2020  Referring physician: Guadalupe Maple, MD   This document serves as a record of services personally performed by Hosie Poisson, MD. It was created on their behalf by [facility], a trained medical scribe. The creation of this record is based on the scribe's personal observations and the provider's statements to them.  CHIEF COMPLAINT:  CC: An 87 year old with Invasive ductal carcinoma of the right breast here for 3 week pre-chemotherapy evaluation.  Current Treatment:  Weekly paclitaxel x 9 weeks with trastuzumab/pertuzumab every 3 weeks.  HISTORY OF PRESENT ILLNESS:  Brandy Jacobs is a 88 y.o. female with invasive ductal carcinoma of the right breast, diagnosed in December 2021.Ultrasound guided biopsy was pursued and surgical pathology from that procedure confirmed invasive ductal carcinoma, grade 2/3, and DCIS with central necrosis.  HER2 was positive, estrogen receptor was positive at 95% and progesterone receptor was positive at 30%.  Ki67 was 40%.  As she is HER2 positive, we recommended neoadjuvant chemotherapy with paclitaxel weekly for 9 doses and Herceptin/ Perjeta every 3 weeks for a total of 1 year. She received her first cycle on January 4th. She underwent a stereotactic biopsy of an area in the right breast inferior to the tumor on January 24th to help decide about future surgery.  Pathology revealed atypical ductal hyperplasia.  She has had problems with nausea and diarrhea resulting in dehydration and hypokalemia.  We ended up skipping day 15 of cycle 2.  She did receive IV potassium.    INTERVAL HISTORY:  Brandy Jacobs is here for follow up prior to a 3rd cycle of paclitaxel/trastuzumab/pertuzumab.  This was delayed 1 week to allow for toxicity recovery, which means she has been off of treatment for 2 weeks.  She does wish to try and complete the 3rd cycle  if tolerated.  At this time, she is doing fairly well, and denies complaints today.  Hemoglobin is stable at 10.5, and other blood counts and chemistries are unremarkable.  Her potassium and protein are back to normal.  Her  appetite is good, and she has gained 2 pounds since her last visit.  She denies fever, chills or other signs of infection.  She denies nausea, vomiting, bowel issues, or abdominal pain.  She denies sore throat, cough, dyspnea, or chest pain.  REVIEW OF SYSTEMS:  Review of Systems  Constitutional: Negative.  Negative for appetite change, chills, fatigue, fever and unexpected weight change.  HENT:  Negative.   Eyes: Negative.   Respiratory: Negative.  Negative for chest tightness, cough, hemoptysis, shortness of breath and wheezing.   Cardiovascular: Negative.  Negative for chest pain, leg swelling and palpitations.  Gastrointestinal: Negative.  Negative for abdominal distention, abdominal pain, blood in stool, constipation, diarrhea, nausea and vomiting.  Endocrine: Negative.   Genitourinary: Negative.  Negative for difficulty urinating, dysuria, frequency and hematuria.   Musculoskeletal: Negative.  Negative for arthralgias, back pain, flank pain, gait problem, myalgias, neck pain and neck stiffness.  Skin: Negative.   Neurological: Negative.  Negative for dizziness, extremity weakness, gait problem, headaches, light-headedness, numbness, seizures and speech difficulty.  Hematological: Negative.   Psychiatric/Behavioral: Negative.  Negative for depression and sleep disturbance. The patient is not nervous/anxious.      VITALS:  Blood pressure (!) 148/66, pulse 75, temperature 98.3 F (36.8 C), temperature source Oral, resp. rate 18, height 5' 3.5" (1.613 m), weight 155 lb  14.4 oz (70.7 kg), SpO2 94 %.  Wt Readings from Last 3 Encounters:  03/17/20 155 lb 14.4 oz (70.7 kg)  03/12/20 154 lb 4 oz (70 kg)  03/10/20 154 lb 4.8 oz (70 kg)    Body mass index is 27.18  kg/m.  Performance status (ECOG): 0 - Asymptomatic  PHYSICAL EXAM:  Physical Exam Constitutional:      General: She is not in acute distress.    Appearance: Normal appearance. She is normal weight.  HENT:     Head: Normocephalic and atraumatic.  Eyes:     General: No scleral icterus.    Extraocular Movements: Extraocular movements intact.     Conjunctiva/sclera: Conjunctivae normal.     Pupils: Pupils are equal, round, and reactive to light.  Cardiovascular:     Rate and Rhythm: Normal rate and regular rhythm.     Pulses: Normal pulses.     Heart sounds: Normal heart sounds. No murmur heard. No friction rub. No gallop.   Pulmonary:     Effort: Pulmonary effort is normal. No respiratory distress.     Breath sounds: Normal breath sounds.  Chest:     Comments:  I can feel a firm flat area in the upper right breast.   Abdominal:     General: Bowel sounds are normal. There is no distension.     Palpations: Abdomen is soft. There is no mass.     Tenderness: There is no abdominal tenderness.  Musculoskeletal:        General: Normal range of motion.     Cervical back: Normal range of motion and neck supple.     Right lower leg: No edema.     Left lower leg: No edema.  Lymphadenopathy:     Cervical: No cervical adenopathy.  Skin:    General: Skin is warm and dry.  Neurological:     General: No focal deficit present.     Mental Status: She is alert and oriented to person, place, and time. Mental status is at baseline.  Psychiatric:        Mood and Affect: Mood normal.        Behavior: Behavior normal.        Thought Content: Thought content normal.        Judgment: Judgment normal.    LABS:   CBC Latest Ref Rng & Units 03/10/2020 03/03/2020 02/25/2020  WBC - 4.4 2.5 2.7  Hemoglobin 12.0 - 16.0 10.5(A) 11.4(A) 11.6(A)  Hematocrit 36 - 46 31(A) 34(A) 36  Platelets 150 - 399 234 206 209   CMP Latest Ref Rng & Units 03/10/2020 03/03/2020 02/25/2020  BUN 4 - '21 9 14 15  ' Creatinine  0.5 - 1.1 0.7 0.7 0.8  Sodium 137 - 147 138 135(A) 138  Potassium 3.4 - 5.3 3.0(A) 3.4 3.9  Chloride 99 - 108 103 103 105  CO2 13 - 22 29(A) 28(A) 27(A)  Calcium 8.7 - 10.7 8.3(A) 8.9 9.7  Total Protein 6.3 - 8.2 g/dL 6.2(A) - -  Alkaline Phos 25 - 125 74 83 80  AST 13 - 35 18 25 37(A)  ALT 7 - 35 20 32 40(A)    STUDIES:   No current studies  HISTORY:   Allergies: No Known Allergies  Current Medications: Current Outpatient Medications  Medication Sig Dispense Refill  . acetaminophen (TYLENOL) 500 MG tablet Take 500 mg by mouth at bedtime.    Marland Kitchen aspirin EC 81 MG tablet Take 81 mg by mouth  daily. Swallow whole.    Marland Kitchen atorvastatin (LIPITOR) 40 MG tablet Take 40 mg by mouth daily.    Marland Kitchen dexamethasone (DECADRON) 4 MG tablet Take 1 tablet (4 mg total) by mouth as directed. 3 pills night before and 3 pills morning of first chemo, then 1 pill night before and morning of weekly chemo 30 tablet 2  . Loperamide HCl (IMODIUM PO) Take by mouth. Over the counter imodium, patient is taking    . losartan (COZAAR) 50 MG tablet Take 50 mg by mouth daily.    Marland Kitchen omeprazole (PRILOSEC) 40 MG capsule Take 1 capsule (40 mg total) by mouth daily. 30 capsule 3  . ondansetron (ZOFRAN) 4 MG tablet Take 1 tablet (4 mg total) by mouth every 4 (four) hours as needed for nausea or vomiting. 30 tablet 5  . oxyCODONE (OXY IR/ROXICODONE) 5 MG immediate release tablet SMARTSIG:1 Tablet(s) By Mouth 4-5 Times Daily    . predniSONE (DELTASONE) 5 MG tablet Take 2.5 mg by mouth 2 (two) times a week. TAKES 1/2 TABLET OF 5MG TWICE A WEEK    . prochlorperazine (COMPAZINE) 10 MG tablet Take 1 tablet (10 mg total) by mouth every 6 (six) hours as needed for nausea or vomiting. 30 tablet 5  . Vitamin D3 (VITAMIN D) 25 MCG tablet Take 1,000 Units by mouth daily.     No current facility-administered medications for this visit.     ASSESSMENT & PLAN:   Assessment:   1. Stage II HER2 and hormone receptor positive invasive ductal  carcinoma and ductal carcinoma in situ of the right breast, diagnosed in December 2021.  She is receiving neoadjuvant paclitaxel/trastuzumab/pertuzumab and clearly responding to this.  She was struggling with toxicities and so we have skipped 2 weeks, but she is willing to take the final 3 weeks.    2. Fatigue and insomnia.  3. Neuropathy, in all extremities.  4. Hypokalemia due to diarrhea, resolved with IV supplement and holding her treatment for 2 weeks.  I reminded her to continue a potassium rich diet.  Plan:  She will proceed with day 1 cycle 3 of weekly paclitaxel/trastuzumab/pertuzumab on February 24th. Even though she has struggled with treatment, she wants to try and complete 3 more weekly cycles if tolerated.  We will plan to see her back weekly during this final cycle to decide whether to continue Taxol.  After treatment is finished she will have a break, and we will obtain CT imaging likely in March or early April.  We will see her back in 1 week with CBC and CMP for reassessment prior to day 8, and in 2 weeks prior to day 15.  She understands and agrees to this plan of care.       She and her daughter verbalized understanding of and agreement to the plans discussed today. They know to call the office should any new questions or concerns arise.    Derwood Kaplan, MD [facility] AT [facility] [address] [place] [address] Dept: [phone] Dept Fax: [phone]   I, Rita Ohara, am acting as scribe for Derwood Kaplan, MD  I have reviewed this report as typed by the medical scribe, and it is complete and accurate.

## 2020-03-17 ENCOUNTER — Other Ambulatory Visit: Payer: Self-pay

## 2020-03-17 ENCOUNTER — Inpatient Hospital Stay: Payer: Medicare Other

## 2020-03-17 ENCOUNTER — Telehealth: Payer: Self-pay | Admitting: Oncology

## 2020-03-17 ENCOUNTER — Other Ambulatory Visit: Payer: Self-pay | Admitting: Hematology and Oncology

## 2020-03-17 ENCOUNTER — Inpatient Hospital Stay (INDEPENDENT_AMBULATORY_CARE_PROVIDER_SITE_OTHER): Payer: Medicare Other | Admitting: Oncology

## 2020-03-17 ENCOUNTER — Other Ambulatory Visit: Payer: Self-pay | Admitting: Oncology

## 2020-03-17 ENCOUNTER — Encounter: Payer: Self-pay | Admitting: Oncology

## 2020-03-17 VITALS — BP 148/66 | HR 75 | Temp 98.3°F | Resp 18 | Ht 63.5 in | Wt 155.9 lb

## 2020-03-17 DIAGNOSIS — C50411 Malignant neoplasm of upper-outer quadrant of right female breast: Secondary | ICD-10-CM

## 2020-03-17 DIAGNOSIS — Z171 Estrogen receptor negative status [ER-]: Secondary | ICD-10-CM | POA: Diagnosis not present

## 2020-03-17 LAB — BASIC METABOLIC PANEL
BUN: 14 (ref 4–21)
CO2: 32 — AB (ref 13–22)
Chloride: 104 (ref 99–108)
Creatinine: 0.7 (ref 0.5–1.1)
Glucose: 131
Potassium: 3.6 (ref 3.4–5.3)
Sodium: 143 (ref 137–147)

## 2020-03-17 LAB — COMPREHENSIVE METABOLIC PANEL
Albumin: 3.5 (ref 3.5–5.0)
Calcium: 9.2 (ref 8.7–10.7)

## 2020-03-17 LAB — CBC AND DIFFERENTIAL
HCT: 31 — AB (ref 36–46)
Hemoglobin: 10.5 — AB (ref 12.0–16.0)
Neutrophils Absolute: 3.96
Platelets: 219 (ref 150–399)
WBC: 6

## 2020-03-17 LAB — HEPATIC FUNCTION PANEL
ALT: 19 (ref 7–35)
AST: 24 (ref 13–35)
Alkaline Phosphatase: 79 (ref 25–125)
Bilirubin, Total: 1.2

## 2020-03-17 LAB — CBC: RBC: 3.35 — AB (ref 3.87–5.11)

## 2020-03-17 NOTE — Telephone Encounter (Signed)
Per 2/22 los next appt sched and given to patient 

## 2020-03-19 ENCOUNTER — Inpatient Hospital Stay: Payer: Medicare Other

## 2020-03-19 ENCOUNTER — Other Ambulatory Visit: Payer: Self-pay

## 2020-03-19 VITALS — BP 154/66 | HR 74 | Temp 98.6°F | Resp 18 | Ht 63.5 in | Wt 155.0 lb

## 2020-03-19 DIAGNOSIS — C50411 Malignant neoplasm of upper-outer quadrant of right female breast: Secondary | ICD-10-CM

## 2020-03-19 DIAGNOSIS — Z5112 Encounter for antineoplastic immunotherapy: Secondary | ICD-10-CM | POA: Diagnosis not present

## 2020-03-19 MED ORDER — DIPHENHYDRAMINE HCL 50 MG/ML IJ SOLN
25.0000 mg | Freq: Once | INTRAMUSCULAR | Status: AC
  Administered 2020-03-19: 25 mg via INTRAVENOUS

## 2020-03-19 MED ORDER — ACETAMINOPHEN 325 MG PO TABS
ORAL_TABLET | ORAL | Status: AC
  Filled 2020-03-19: qty 2

## 2020-03-19 MED ORDER — ACETAMINOPHEN 325 MG PO TABS
650.0000 mg | ORAL_TABLET | Freq: Once | ORAL | Status: AC
  Administered 2020-03-19: 650 mg via ORAL

## 2020-03-19 MED ORDER — TRASTUZUMAB-DKST CHEMO 150 MG IV SOLR
6.0000 mg/kg | Freq: Once | INTRAVENOUS | Status: AC
  Administered 2020-03-19: 441 mg via INTRAVENOUS
  Filled 2020-03-19: qty 21

## 2020-03-19 MED ORDER — SODIUM CHLORIDE 0.9 % IV SOLN
Freq: Once | INTRAVENOUS | Status: AC
  Filled 2020-03-19: qty 250

## 2020-03-19 MED ORDER — SODIUM CHLORIDE 0.9 % IV SOLN
420.0000 mg | Freq: Once | INTRAVENOUS | Status: AC
  Administered 2020-03-19: 420 mg via INTRAVENOUS
  Filled 2020-03-19: qty 14

## 2020-03-19 MED ORDER — FAMOTIDINE IN NACL 20-0.9 MG/50ML-% IV SOLN
20.0000 mg | Freq: Once | INTRAVENOUS | Status: AC
  Administered 2020-03-19: 20 mg via INTRAVENOUS

## 2020-03-19 MED ORDER — SODIUM CHLORIDE 0.9 % IV SOLN
10.0000 mg | Freq: Once | INTRAVENOUS | Status: AC
  Administered 2020-03-19: 10 mg via INTRAVENOUS
  Filled 2020-03-19: qty 1

## 2020-03-19 MED ORDER — DIPHENHYDRAMINE HCL 50 MG/ML IJ SOLN
INTRAMUSCULAR | Status: AC
  Filled 2020-03-19: qty 1

## 2020-03-19 MED ORDER — SODIUM CHLORIDE 0.9 % IV SOLN
53.3333 mg/m2 | Freq: Once | INTRAVENOUS | Status: AC
  Administered 2020-03-19: 96 mg via INTRAVENOUS
  Filled 2020-03-19: qty 16

## 2020-03-19 MED ORDER — HEPARIN SOD (PORK) LOCK FLUSH 100 UNIT/ML IV SOLN
500.0000 [IU] | Freq: Once | INTRAVENOUS | Status: AC | PRN
  Administered 2020-03-19: 500 [IU]
  Filled 2020-03-19: qty 5

## 2020-03-19 MED ORDER — FAMOTIDINE IN NACL 20-0.9 MG/50ML-% IV SOLN
INTRAVENOUS | Status: AC
  Filled 2020-03-19: qty 50

## 2020-03-19 NOTE — Patient Instructions (Signed)
[facility] Discharge Instructions for Patients Receiving Chemotherapy  Today you received the following chemotherapy agents PERTUZUMAB,TRASTUZUMAB, PACLITAXEL.  To help prevent nausea and vomiting after your treatment, we encourage you to take your nausea medication.  Paclitaxel injection What is this medicine? PACLITAXEL (PAK li TAX el) is a chemotherapy drug. It targets fast dividing cells, like cancer cells, and causes these cells to die. This medicine is used to treat ovarian cancer, breast cancer, lung cancer, Kaposi's sarcoma, and other cancers. This medicine may be used for other purposes; ask your health care provider or pharmacist if you have questions. COMMON BRAND NAME(S): Onxol, Taxol What should I tell my health care provider before I take this medicine? They need to know if you have any of these conditions:  history of irregular heartbeat  liver disease  low blood counts, like low white cell, platelet, or red cell counts  lung or breathing disease, like asthma  tingling of the fingers or toes, or other nerve disorder  an unusual or allergic reaction to paclitaxel, alcohol, polyoxyethylated castor oil, other chemotherapy, other medicines, foods, dyes, or preservatives  pregnant or trying to get pregnant  breast-feeding How should I use this medicine? This drug is given as an infusion into a vein. It is administered in a hospital or clinic by a specially trained health care professional. Talk to your pediatrician regarding the use of this medicine in children. Special care may be needed. Overdosage: If you think you have taken too much of this medicine contact a poison control center or emergency room at once. NOTE: This medicine is only for you. Do not share this medicine with others. What if I miss a dose? It is important not to miss your dose. Call your doctor or health care professional if you are unable to keep an appointment. What may  interact with this medicine? Do not take this medicine with any of the following medications:  live virus vaccines This medicine may also interact with the following medications:  antiviral medicines for hepatitis, HIV or AIDS  certain antibiotics like erythromycin and clarithromycin  certain medicines for fungal infections like ketoconazole and itraconazole  certain medicines for seizures like carbamazepine, phenobarbital, phenytoin  gemfibrozil  nefazodone  rifampin  St. John's wort This list may not describe all possible interactions. Give your health care provider a list of all the medicines, herbs, non-prescription drugs, or dietary supplements you use. Also tell them if you smoke, drink alcohol, or use illegal drugs. Some items may interact with your medicine. What should I watch for while using this medicine? Your condition will be monitored carefully while you are receiving this medicine. You will need important blood work done while you are taking this medicine. This medicine can cause serious allergic reactions. To reduce your risk you will need to take other medicine(s) before treatment with this medicine. If you experience allergic reactions like skin rash, itching or hives, swelling of the face, lips, or tongue, tell your doctor or health care professional right away. In some cases, you may be given additional medicines to help with side effects. Follow all directions for their use. This drug may make you feel generally unwell. This is not uncommon, as chemotherapy can affect healthy cells as well as cancer cells. Report any side effects. Continue your course of treatment even though you feel ill unless your doctor tells you to stop. Call your doctor or health care professional for advice if you get a fever, chills  or sore throat, or other symptoms of a cold or flu. Do not treat yourself. This drug decreases your body's ability to fight infections. Try to avoid being around  people who are sick. This medicine may increase your risk to bruise or bleed. Call your doctor or health care professional if you notice any unusual bleeding. Be careful brushing and flossing your teeth or using a toothpick because you may get an infection or bleed more easily. If you have any dental work done, tell your dentist you are receiving this medicine. Avoid taking products that contain aspirin, acetaminophen, ibuprofen, naproxen, or ketoprofen unless instructed by your doctor. These medicines may hide a fever. Do not become pregnant while taking this medicine. Women should inform their doctor if they wish to become pregnant or think they might be pregnant. There is a potential for serious side effects to an unborn child. Talk to your health care professional or pharmacist for more information. Do not breast-feed an infant while taking this medicine. Men are advised not to father a child while receiving this medicine. This product may contain alcohol. Ask your pharmacist or healthcare provider if this medicine contains alcohol. Be sure to tell all healthcare providers you are taking this medicine. Certain medicines, like metronidazole and disulfiram, can cause an unpleasant reaction when taken with alcohol. The reaction includes flushing, headache, nausea, vomiting, sweating, and increased thirst. The reaction can last from 30 minutes to several hours. What side effects may I notice from receiving this medicine? Side effects that you should report to your doctor or health care professional as soon as possible:  allergic reactions like skin rash, itching or hives, swelling of the face, lips, or tongue  breathing problems  changes in vision  fast, irregular heartbeat  high or low blood pressure  mouth sores  pain, tingling, numbness in the hands or feet  signs of decreased platelets or bleeding - bruising, pinpoint red spots on the skin, black, tarry stools, blood in the urine  signs  of decreased red blood cells - unusually weak or tired, feeling faint or lightheaded, falls  signs of infection - fever or chills, cough, sore throat, pain or difficulty passing urine  signs and symptoms of liver injury like dark yellow or brown urine; general ill feeling or flu-like symptoms; light-colored stools; loss of appetite; nausea; right upper belly pain; unusually weak or tired; yellowing of the eyes or skin  swelling of the ankles, feet, hands  unusually slow heartbeat Side effects that usually do not require medical attention (report to your doctor or health care professional if they continue or are bothersome):  diarrhea  hair loss  loss of appetite  muscle or joint pain  nausea, vomiting  pain, redness, or irritation at site where injected  tiredness This list may not describe all possible side effects. Call your doctor for medical advice about side effects. You may report side effects to FDA at 1-800-FDA-1088. Where should I keep my medicine? This drug is given in a hospital or clinic and will not be stored at home. NOTE: This sheet is a summary. It may not cover all possible information. If you have questions about this medicine, talk to your doctor, pharmacist, or health care provider.  2021 Elsevier/Gold Standard (2018-12-12 13:37:23)    Pertuzumab injection What is this medicine? PERTUZUMAB (per TOOZ ue mab) is a monoclonal antibody. It is used to treat breast cancer. This medicine may be used for other purposes; ask your health care provider or  pharmacist if you have questions. COMMON BRAND NAME(S): PERJETA What should I tell my health care provider before I take this medicine? They need to know if you have any of these conditions:  heart disease  heart failure  high blood pressure  history of irregular heart beat  recent or ongoing radiation therapy  an unusual or allergic reaction to pertuzumab, other medicines, foods, dyes, or  preservatives  pregnant or trying to get pregnant  breast-feeding How should I use this medicine? This medicine is for infusion into a vein. It is given by a health care professional in a hospital or clinic setting. Talk to your pediatrician regarding the use of this medicine in children. Special care may be needed. Overdosage: If you think you have taken too much of this medicine contact a poison control center or emergency room at once. NOTE: This medicine is only for you. Do not share this medicine with others. What if I miss a dose? It is important not to miss your dose. Call your doctor or health care professional if you are unable to keep an appointment. What may interact with this medicine? Interactions are not expected. Give your health care provider a list of all the medicines, herbs, non-prescription drugs, or dietary supplements you use. Also tell them if you smoke, drink alcohol, or use illegal drugs. Some items may interact with your medicine. This list may not describe all possible interactions. Give your health care provider a list of all the medicines, herbs, non-prescription drugs, or dietary supplements you use. Also tell them if you smoke, drink alcohol, or use illegal drugs. Some items may interact with your medicine. What should I watch for while using this medicine? Your condition will be monitored carefully while you are receiving this medicine. Report any side effects. Continue your course of treatment even though you feel ill unless your doctor tells you to stop. Do not become pregnant while taking this medicine or for 7 months after stopping it. Women should inform their doctor if they wish to become pregnant or think they might be pregnant. Women of child-bearing potential will need to have a negative pregnancy test before starting this medicine. There is a potential for serious side effects to an unborn child. Talk to your health care professional or pharmacist for more  information. Do not breast-feed an infant while taking this medicine or for 7 months after stopping it. Women must use effective birth control with this medicine. Call your doctor or health care professional for advice if you get a fever, chills or sore throat, or other symptoms of a cold or flu. Do not treat yourself. Try to avoid being around people who are sick. You may experience fever, chills, and headache during the infusion. Report any side effects during the infusion to your health care professional. What side effects may I notice from receiving this medicine? Side effects that you should report to your doctor or health care professional as soon as possible:  breathing problems  chest pain or palpitations  dizziness  feeling faint or lightheaded  fever or chills  skin rash, itching or hives  sore throat  swelling of the face, lips, or tongue  swelling of the legs or ankles  unusually weak or tired Side effects that usually do not require medical attention (report to your doctor or health care professional if they continue or are bothersome):  diarrhea  hair loss  nausea, vomiting  tiredness This list may not describe all possible side effects.  Call your doctor for medical advice about side effects. You may report side effects to FDA at 1-800-FDA-1088. Where should I keep my medicine? This drug is given in a hospital or clinic and will not be stored at home. NOTE: This sheet is a summary. It may not cover all possible information. If you have questions about this medicine, talk to your doctor, pharmacist, or health care provider.  2021 Else[employer]/Gold Standard (2015-02-12 12:08:50)    Trastuzumab injection for infusion What is this medicine? TRASTUZUMAB (tras TOO zoo mab) is a monoclonal antibody. It is used to treat breast cancer and stomach cancer. This medicine may be used for other purposes; ask your health care provider or pharmacist if you have  questions. COMMON BRAND NAME(S): Herceptin, Galvin Proffer, Trazimera What should I tell my health care provider before I take this medicine? They need to know if you have any of these conditions:  heart disease  heart failure  lung or breathing disease, like asthma  an unusual or allergic reaction to trastuzumab, benzyl alcohol, or other medications, foods, dyes, or preservatives  pregnant or trying to get pregnant  breast-feeding How should I use this medicine? This drug is given as an infusion into a vein. It is administered in a hospital or clinic by a specially trained health care professional. Talk to your pediatrician regarding the use of this medicine in children. This medicine is not approved for use in children. Overdosage: If you think you have taken too much of this medicine contact a poison control center or emergency room at once. NOTE: This medicine is only for you. Do not share this medicine with others. What if I miss a dose? It is important not to miss a dose. Call your doctor or health care professional if you are unable to keep an appointment. What may interact with this medicine? This medicine may interact with the following medications:  certain types of chemotherapy, such as daunorubicin, doxorubicin, epirubicin, and idarubicin This list may not describe all possible interactions. Give your health care provider a list of all the medicines, herbs, non-prescription drugs, or dietary supplements you use. Also tell them if you smoke, drink alcohol, or use illegal drugs. Some items may interact with your medicine. What should I watch for while using this medicine? Visit your doctor for checks on your progress. Report any side effects. Continue your course of treatment even though you feel ill unless your doctor tells you to stop. Call your doctor or health care professional for advice if you get a fever, chills or sore throat, or other symptoms of  a cold or flu. Do not treat yourself. Try to avoid being around people who are sick. You may experience fever, chills and shaking during your first infusion. These effects are usually mild and can be treated with other medicines. Report any side effects during the infusion to your health care professional. Fever and chills usually do not happen with later infusions. Do not become pregnant while taking this medicine or for 7 months after stopping it. Women should inform their doctor if they wish to become pregnant or think they might be pregnant. Women of child-bearing potential will need to have a negative pregnancy test before starting this medicine. There is a potential for serious side effects to an unborn child. Talk to your health care professional or pharmacist for more information. Do not breast-feed an infant while taking this medicine or for 7 months after stopping it. Women must use effective  birth control with this medicine. What side effects may I notice from receiving this medicine? Side effects that you should report to your doctor or health care professional as soon as possible:  allergic reactions like skin rash, itching or hives, swelling of the face, lips, or tongue  chest pain or palpitations  cough  dizziness  feeling faint or lightheaded, falls  fever  general ill feeling or flu-like symptoms  signs of worsening heart failure like breathing problems; swelling in your legs and feet  unusually weak or tired Side effects that usually do not require medical attention (report to your doctor or health care professional if they continue or are bothersome):  bone pain  changes in taste  diarrhea  joint pain  nausea/vomiting  weight loss This list may not describe all possible side effects. Call your doctor for medical advice about side effects. You may report side effects to FDA at 1-800-FDA-1088. Where should I keep my medicine? This drug is given in a hospital or  clinic and will not be stored at home. NOTE: This sheet is a summary. It may not cover all possible information. If you have questions about this medicine, talk to your doctor, pharmacist, or health care provider.  2021 [employer]/Gold Standard (2016-01-05 14:37:52)    If you develop nausea and vomiting that is not controlled by your nausea medication, call the clinic.   BELOW ARE SYMPTOMS THAT SHOULD BE REPORTED IMMEDIATELY:  *FEVER GREATER THAN 100.5 F  *CHILLS WITH OR WITHOUT FEVER  NAUSEA AND VOMITING THAT IS NOT CONTROLLED WITH YOUR NAUSEA MEDICATION  *UNUSUAL SHORTNESS OF BREATH  *UNUSUAL BRUISING OR BLEEDING  TENDERNESS IN MOUTH AND THROAT WITH OR WITHOUT PRESENCE OF ULCERS  *URINARY PROBLEMS  *BOWEL PROBLEMS  UNUSUAL RASH Items with * indicate a potential emergency and should be followed up as soon as possible.  Feel free to call the clinic should you have any questions or concerns at The clinic phone number is [phone].  Please show the [facility] at check-in to the Emergency Department and triage nurse.

## 2020-03-23 NOTE — Progress Notes (Signed)
[facility]  [address] [place],  [place]  [address] [phone]  Clinic Day:  03/24/2020  Referring physician: Guadalupe Maple, MD    CHIEF COMPLAINT:  CC: An 87 year old with Invasive ductal carcinoma of the right breast here for 3 week pre-chemotherapy evaluation.  Current Treatment:  Weekly paclitaxel x 9 weeks with trastuzumab/pertuzumab every 3 weeks.  HISTORY OF PRESENT ILLNESS:  Brandy Jacobs is a 87 y.o. female with invasive ductal carcinoma of the right breast, diagnosed in December 2021.Ultrasound guided biopsy was pursued and surgical pathology from that procedure confirmed invasive ductal carcinoma, grade 2/3, and DCIS with central necrosis.  HER2 was positive, estrogen receptor was positive at 95% and progesterone receptor was positive at 30%.  Ki67 was 40%.  As she is HER2 positive, we recommended neoadjuvant chemotherapy with paclitaxel weekly for 9 doses and Herceptin/ Perjeta every 3 weeks for a total of 1 year. She received her first cycle on January 4th. She underwent a stereotactic biopsy of an area in the right breast inferior to the tumor on January 24th to help decide about future surgery.  Pathology revealed atypical ductal hyperplasia.  She has had problems with nausea and diarrhea resulting in dehydration and hypokalemia.  We ended up skipping day 15 of cycle 2.  She did receive IV potassium.    INTERVAL HISTORY:  Brandy Jacobs is here for evaluation prior to Day 8 of Cycle 3  paclitaxel/trastuzumab/pertuzumab. She states she tolerated Day 1 fairly well with some nausea at times. This is controlled with medication. She had diarrhea as well which has since resolved. She denies fever, chills or vomiting. She denies shortness of breath, chest pain, or cough. She denies issue with bladder. She denies pain. Her appetite is good, although she has lost 3 pounds since last visit. CBC is unremarkable today. CMP reveals a bilirubin 1.6 elevated from 1.3  at last visit.   REVIEW OF SYSTEMS:  Review of Systems  Constitutional: Negative for appetite change, chills, diaphoresis, fatigue, fever and unexpected weight change.  HENT:   Negative for hearing loss, lump/mass, mouth sores, nosebleeds, sore throat, tinnitus, trouble swallowing and voice change.   Eyes: Negative for eye problems and icterus.  Respiratory: Negative for chest tightness, cough, hemoptysis and shortness of breath. Wheezing:     Cardiovascular: Negative for chest pain, leg swelling and palpitations.  Gastrointestinal: Negative for abdominal distention, abdominal pain, blood in stool, constipation, diarrhea, nausea, rectal pain and vomiting.  Endocrine: Negative for hot flashes.  Genitourinary: Negative for bladder incontinence, difficulty urinating, dyspareunia, dysuria, frequency, hematuria and nocturia.   Musculoskeletal: Negative for arthralgias, back pain, flank pain, gait problem, myalgias, neck pain and neck stiffness.  Skin: Negative for itching, rash and wound.  Neurological: Negative for dizziness, extremity weakness, gait problem, headaches, light-headedness, numbness, seizures and speech difficulty.  Hematological: Negative for adenopathy. Does not bruise/bleed easily.  Psychiatric/Behavioral: Negative for confusion, decreased concentration, depression, sleep disturbance and suicidal ideas. The patient is not nervous/anxious.      VITALS:  Blood pressure 136/63, pulse 96, temperature 98.5 F (36.9 C), temperature source Oral, resp. rate 18, height 5' 3.5" (1.613 m), weight 152 lb 14.4 oz (69.4 kg), SpO2 96 %.  Wt Readings from Last 3 Encounters:  03/24/20 152 lb 14.4 oz (69.4 kg)  03/19/20 155 lb (70.3 kg)  03/17/20 155 lb 14.4 oz (70.7 kg)    Body mass index is 26.66 kg/m.  Performance status (ECOG): 0 - Asymptomatic  PHYSICAL  EXAM:  Physical Exam Constitutional:      General: She is not in acute distress.    Appearance: Normal appearance. She is normal  weight. She is not ill-appearing, toxic-appearing or diaphoretic.  HENT:     Head: Normocephalic and atraumatic.     Nose: Nose normal. No congestion or rhinorrhea.     Mouth/Throat:     Mouth: Mucous membranes are moist.     Pharynx: Oropharynx is clear. No oropharyngeal exudate or posterior oropharyngeal erythema.  Eyes:     General: No scleral icterus.       Right eye: No discharge.        Left eye: No discharge.     Extraocular Movements: Extraocular movements intact.     Conjunctiva/sclera: Conjunctivae normal.     Pupils: Pupils are equal, round, and reactive to light.  Neck:     Vascular: No carotid bruit.  Cardiovascular:     Rate and Rhythm: Normal rate and regular rhythm.     Heart sounds: No murmur heard. No friction rub. No gallop.   Pulmonary:     Effort: Pulmonary effort is normal. No respiratory distress.     Breath sounds: Normal breath sounds. No stridor. No wheezing, rhonchi or rales.  Chest:     Chest wall: No tenderness.  Abdominal:     General: Abdomen is flat. Bowel sounds are normal. There is no distension.     Palpations: There is no mass.     Tenderness: There is no abdominal tenderness. There is no right CVA tenderness, left CVA tenderness, guarding or rebound.     Hernia: No hernia is present.  Musculoskeletal:        General: No swelling, tenderness, deformity or signs of injury. Normal range of motion.     Cervical back: Normal range of motion and neck supple. No rigidity or tenderness.     Right lower leg: No edema.     Left lower leg: No edema.  Lymphadenopathy:     Cervical: No cervical adenopathy.  Skin:    General: Skin is warm and dry.     Capillary Refill: Capillary refill takes less than 2 seconds.     Coloration: Skin is not jaundiced or pale.     Findings: No bruising, erythema, lesion or rash.  Neurological:     General: No focal deficit present.     Mental Status: She is alert and oriented to person, place, and time. Mental status  is at baseline.     Cranial Nerves: No cranial nerve deficit.     Sensory: No sensory deficit.     Motor: No weakness.     Coordination: Coordination normal.     Gait: Gait normal.     Deep Tendon Reflexes: Reflexes normal.  Psychiatric:        Mood and Affect: Mood normal.        Behavior: Behavior normal.        Thought Content: Thought content normal.        Judgment: Judgment normal.    LABS:   CBC Latest Ref Rng & Units 03/24/2020 03/17/2020 03/10/2020  WBC - 4.5 6.0 4.4  Hemoglobin 12.0 - 16.0 10.9(A) 10.5(A) 10.5(A)  Hematocrit 36 - 46 33(A) 31(A) 31(A)  Platelets 150 - 399 209 219 234   CMP Latest Ref Rng & Units 03/24/2020 03/17/2020 03/10/2020  BUN 4 - $R'21 14 14 9  'gW$ Creatinine 0.5 - 1.1 0.7 0.7 0.7  Sodium 137 - 147 136(A)  143 138  Potassium 3.4 - 5.3 3.7 3.6 3.0(A)  Chloride 99 - 108 102 104 103  CO2 13 - 22 28(A) 32(A) 29(A)  Calcium 8.7 - 10.7 9.2 9.2 8.3(A)  Total Protein 6.3 - 8.2 g/dL - - 6.2(A)  Alkaline Phos 25 - 125 71 79 74  AST 13 - 35 $Re'25 24 18  'DAE$ ALT 7 - 35 $Re'31 19 20    'pKh$ STUDIES:   No current studies  HISTORY:   Allergies: No Known Allergies  Current Medications: Current Outpatient Medications  Medication Sig Dispense Refill  . acetaminophen (TYLENOL) 500 MG tablet Take 500 mg by mouth at bedtime.    Marland Kitchen aspirin EC 81 MG tablet Take 81 mg by mouth daily. Swallow whole.    Marland Kitchen atorvastatin (LIPITOR) 40 MG tablet Take 40 mg by mouth daily.    Marland Kitchen dexamethasone (DECADRON) 4 MG tablet Take 1 tablet (4 mg total) by mouth as directed. 3 pills night before and 3 pills morning of first chemo, then 1 pill night before and morning of weekly chemo 30 tablet 2  . Loperamide HCl (IMODIUM PO) Take by mouth. Over the counter imodium, patient is taking    . losartan (COZAAR) 50 MG tablet Take 50 mg by mouth daily.    Marland Kitchen omeprazole (PRILOSEC) 40 MG capsule Take 1 capsule (40 mg total) by mouth daily. 30 capsule 3  . ondansetron (ZOFRAN) 4 MG tablet Take 1 tablet (4 mg total) by  mouth every 4 (four) hours as needed for nausea or vomiting. 30 tablet 5  . oxyCODONE (OXY IR/ROXICODONE) 5 MG immediate release tablet SMARTSIG:1 Tablet(s) By Mouth 4-5 Times Daily    . predniSONE (DELTASONE) 5 MG tablet Take 2.5 mg by mouth 2 (two) times a week. TAKES 1/2 TABLET OF $RemoveB'5MG'dcgMGYXC$  TWICE A WEEK    . prochlorperazine (COMPAZINE) 10 MG tablet Take 1 tablet (10 mg total) by mouth every 6 (six) hours as needed for nausea or vomiting. 30 tablet 5  . Vitamin D3 (VITAMIN D) 25 MCG tablet Take 1,000 Units by mouth daily.     No current facility-administered medications for this visit.     ASSESSMENT & PLAN:   Assessment:   1. 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  She is receiving neoadjuvant paclitaxel/trastuzumab/pertuzumab and clearly responding to this. She tolerated Day 1 of Cycle 3 well and is due Day 8 on 03-26-2020.  Plan:  She will proceed with day 8 cycle 3 of weekly paclitaxel/trastuzumab/pertuzumab on March 3. Even though she has struggled with treatment, she wants to try and complete 3 more weekly cycles if tolerated.  We will plan to see her back weekly during this final cycle to decide whether to continue Taxol.  After treatment is finished she will have a break, and we will obtain CT imaging likely in March or early April.  We will see her back in 1 week with CBC and CMP for reassessment prior to day15, cycle 3.   She and her daughter verbalized understanding of and agreement to the plans discussed today. They know to call the office should any new questions or concerns arise.    Melodye Ped, NP [facility] [address] [place] [address] Dept: [phone] Dept Fax: [phone]

## 2020-03-24 ENCOUNTER — Inpatient Hospital Stay: Payer: Medicare Other | Attending: Oncology | Admitting: Hematology and Oncology

## 2020-03-24 ENCOUNTER — Other Ambulatory Visit: Payer: Self-pay

## 2020-03-24 ENCOUNTER — Inpatient Hospital Stay: Payer: Medicare Other

## 2020-03-24 VITALS — BP 136/63 | HR 96 | Temp 98.5°F | Resp 18 | Ht 63.5 in | Wt 152.9 lb

## 2020-03-24 DIAGNOSIS — C50411 Malignant neoplasm of upper-outer quadrant of right female breast: Secondary | ICD-10-CM | POA: Diagnosis not present

## 2020-03-24 DIAGNOSIS — C50911 Malignant neoplasm of unspecified site of right female breast: Secondary | ICD-10-CM | POA: Insufficient documentation

## 2020-03-24 DIAGNOSIS — Z170 Estrogen receptor positive status [ER+]: Secondary | ICD-10-CM | POA: Diagnosis not present

## 2020-03-24 DIAGNOSIS — Z5111 Encounter for antineoplastic chemotherapy: Secondary | ICD-10-CM | POA: Insufficient documentation

## 2020-03-24 DIAGNOSIS — Z79899 Other long term (current) drug therapy: Secondary | ICD-10-CM | POA: Insufficient documentation

## 2020-03-24 LAB — HEPATIC FUNCTION PANEL
ALT: 31 (ref 7–35)
AST: 25 (ref 13–35)
Alkaline Phosphatase: 71 (ref 25–125)
Bilirubin, Total: 1.6

## 2020-03-24 LAB — COMPREHENSIVE METABOLIC PANEL
Albumin: 3.5 (ref 3.5–5.0)
Calcium: 9.2 (ref 8.7–10.7)

## 2020-03-24 LAB — BASIC METABOLIC PANEL
BUN: 14 (ref 4–21)
CO2: 28 — AB (ref 13–22)
Chloride: 102 (ref 99–108)
Creatinine: 0.7 (ref 0.5–1.1)
Glucose: 164
Potassium: 3.7 (ref 3.4–5.3)
Sodium: 136 — AB (ref 137–147)

## 2020-03-24 LAB — CBC AND DIFFERENTIAL
HCT: 33 — AB (ref 36–46)
Hemoglobin: 10.9 — AB (ref 12.0–16.0)
Neutrophils Absolute: 3.33
Platelets: 209 (ref 150–399)
WBC: 4.5

## 2020-03-24 LAB — CBC: RBC: 3.53 — AB (ref 3.87–5.11)

## 2020-03-24 LAB — MAGNESIUM: Magnesium: 1.7

## 2020-03-26 ENCOUNTER — Inpatient Hospital Stay: Payer: Medicare Other

## 2020-03-26 ENCOUNTER — Other Ambulatory Visit: Payer: Self-pay

## 2020-03-26 VITALS — BP 132/65 | HR 61 | Temp 98.4°F | Resp 20 | Ht 63.5 in | Wt 147.8 lb

## 2020-03-26 DIAGNOSIS — C50911 Malignant neoplasm of unspecified site of right female breast: Secondary | ICD-10-CM | POA: Diagnosis present

## 2020-03-26 DIAGNOSIS — Z79899 Other long term (current) drug therapy: Secondary | ICD-10-CM | POA: Diagnosis not present

## 2020-03-26 DIAGNOSIS — C50411 Malignant neoplasm of upper-outer quadrant of right female breast: Secondary | ICD-10-CM

## 2020-03-26 DIAGNOSIS — Z5111 Encounter for antineoplastic chemotherapy: Secondary | ICD-10-CM | POA: Diagnosis not present

## 2020-03-26 DIAGNOSIS — Z170 Estrogen receptor positive status [ER+]: Secondary | ICD-10-CM

## 2020-03-26 MED ORDER — DIPHENHYDRAMINE HCL 50 MG/ML IJ SOLN
25.0000 mg | Freq: Once | INTRAMUSCULAR | Status: AC
  Administered 2020-03-26: 25 mg via INTRAVENOUS

## 2020-03-26 MED ORDER — HEPARIN SOD (PORK) LOCK FLUSH 100 UNIT/ML IV SOLN
500.0000 [IU] | Freq: Once | INTRAVENOUS | Status: AC | PRN
  Administered 2020-03-26: 500 [IU]
  Filled 2020-03-26: qty 5

## 2020-03-26 MED ORDER — FAMOTIDINE IN NACL 20-0.9 MG/50ML-% IV SOLN
INTRAVENOUS | Status: AC
  Filled 2020-03-26: qty 50

## 2020-03-26 MED ORDER — FAMOTIDINE IN NACL 20-0.9 MG/50ML-% IV SOLN
20.0000 mg | Freq: Once | INTRAVENOUS | Status: AC
Start: 2020-03-26 — End: 2020-03-26
  Administered 2020-03-26: 20 mg via INTRAVENOUS

## 2020-03-26 MED ORDER — PACLITAXEL CHEMO INJECTION 300 MG/50ML
53.3333 mg/m2 | Freq: Once | INTRAVENOUS | Status: AC
  Administered 2020-03-26: 96 mg via INTRAVENOUS
  Filled 2020-03-26: qty 16

## 2020-03-26 MED ORDER — DIPHENHYDRAMINE HCL 50 MG/ML IJ SOLN
INTRAMUSCULAR | Status: AC
  Filled 2020-03-26: qty 1

## 2020-03-26 MED ORDER — SODIUM CHLORIDE 0.9 % IV SOLN
10.0000 mg | Freq: Once | INTRAVENOUS | Status: AC
  Administered 2020-03-26: 10 mg via INTRAVENOUS
  Filled 2020-03-26: qty 10

## 2020-03-26 MED ORDER — SODIUM CHLORIDE 0.9 % IV SOLN
Freq: Once | INTRAVENOUS | Status: AC
Start: 2020-03-26 — End: 2020-03-26
  Filled 2020-03-26: qty 250

## 2020-03-26 NOTE — Patient Instructions (Signed)
[facility] Discharge Instructions for Patients Receiving Chemotherapy  Today you received the following chemotherapy agents Paclitaxel.  To help prevent nausea and vomiting after your treatment, we encourage you to take your nausea medication.  Paclitaxel injection What is this medicine? PACLITAXEL (PAK li TAX el) is a chemotherapy drug. It targets fast dividing cells, like cancer cells, and causes these cells to die. This medicine is used to treat ovarian cancer, breast cancer, lung cancer, Kaposi's sarcoma, and other cancers. This medicine may be used for other purposes; ask your health care provider or pharmacist if you have questions. COMMON BRAND NAME(S): Onxol, Taxol What should I tell my health care provider before I take this medicine? They need to know if you have any of these conditions:  history of irregular heartbeat  liver disease  low blood counts, like low white cell, platelet, or red cell counts  lung or breathing disease, like asthma  tingling of the fingers or toes, or other nerve disorder  an unusual or allergic reaction to paclitaxel, alcohol, polyoxyethylated castor oil, other chemotherapy, other medicines, foods, dyes, or preservatives  pregnant or trying to get pregnant  breast-feeding How should I use this medicine? This drug is given as an infusion into a vein. It is administered in a hospital or clinic by a specially trained health care professional. Talk to your pediatrician regarding the use of this medicine in children. Special care may be needed. Overdosage: If you think you have taken too much of this medicine contact a poison control center or emergency room at once. NOTE: This medicine is only for you. Do not share this medicine with others. What if I miss a dose? It is important not to miss your dose. Call your doctor or health care professional if you are unable to keep an appointment. What may interact with this  medicine? Do not take this medicine with any of the following medications:  live virus vaccines This medicine may also interact with the following medications:  antiviral medicines for hepatitis, HIV or AIDS  certain antibiotics like erythromycin and clarithromycin  certain medicines for fungal infections like ketoconazole and itraconazole  certain medicines for seizures like carbamazepine, phenobarbital, phenytoin  gemfibrozil  nefazodone  rifampin  St. John's wort This list may not describe all possible interactions. Give your health care provider a list of all the medicines, herbs, non-prescription drugs, or dietary supplements you use. Also tell them if you smoke, drink alcohol, or use illegal drugs. Some items may interact with your medicine. What should I watch for while using this medicine? Your condition will be monitored carefully while you are receiving this medicine. You will need important blood work done while you are taking this medicine. This medicine can cause serious allergic reactions. To reduce your risk you will need to take other medicine(s) before treatment with this medicine. If you experience allergic reactions like skin rash, itching or hives, swelling of the face, lips, or tongue, tell your doctor or health care professional right away. In some cases, you may be given additional medicines to help with side effects. Follow all directions for their use. This drug may make you feel generally unwell. This is not uncommon, as chemotherapy can affect healthy cells as well as cancer cells. Report any side effects. Continue your course of treatment even though you feel ill unless your doctor tells you to stop. Call your doctor or health care professional for advice if you get a fever, chills or   sore throat, or other symptoms of a cold or flu. Do not treat yourself. This drug decreases your body's ability to fight infections. Try to avoid being around people who are  sick. This medicine may increase your risk to bruise or bleed. Call your doctor or health care professional if you notice any unusual bleeding. Be careful brushing and flossing your teeth or using a toothpick because you may get an infection or bleed more easily. If you have any dental work done, tell your dentist you are receiving this medicine. Avoid taking products that contain aspirin, acetaminophen, ibuprofen, naproxen, or ketoprofen unless instructed by your doctor. These medicines may hide a fever. Do not become pregnant while taking this medicine. Women should inform their doctor if they wish to become pregnant or think they might be pregnant. There is a potential for serious side effects to an unborn child. Talk to your health care professional or pharmacist for more information. Do not breast-feed an infant while taking this medicine. Men are advised not to father a child while receiving this medicine. This product may contain alcohol. Ask your pharmacist or healthcare provider if this medicine contains alcohol. Be sure to tell all healthcare providers you are taking this medicine. Certain medicines, like metronidazole and disulfiram, can cause an unpleasant reaction when taken with alcohol. The reaction includes flushing, headache, nausea, vomiting, sweating, and increased thirst. The reaction can last from 30 minutes to several hours. What side effects may I notice from receiving this medicine? Side effects that you should report to your doctor or health care professional as soon as possible:  allergic reactions like skin rash, itching or hives, swelling of the face, lips, or tongue  breathing problems  changes in vision  fast, irregular heartbeat  high or low blood pressure  mouth sores  pain, tingling, numbness in the hands or feet  signs of decreased platelets or bleeding - bruising, pinpoint red spots on the skin, black, tarry stools, blood in the urine  signs of decreased  red blood cells - unusually weak or tired, feeling faint or lightheaded, falls  signs of infection - fever or chills, cough, sore throat, pain or difficulty passing urine  signs and symptoms of liver injury like dark yellow or brown urine; general ill feeling or flu-like symptoms; light-colored stools; loss of appetite; nausea; right upper belly pain; unusually weak or tired; yellowing of the eyes or skin  swelling of the ankles, feet, hands  unusually slow heartbeat Side effects that usually do not require medical attention (report to your doctor or health care professional if they continue or are bothersome):  diarrhea  hair loss  loss of appetite  muscle or joint pain  nausea, vomiting  pain, redness, or irritation at site where injected  tiredness This list may not describe all possible side effects. Call your doctor for medical advice about side effects. You may report side effects to FDA at 1-800-FDA-1088. Where should I keep my medicine? This drug is given in a hospital or clinic and will not be stored at home. NOTE: This sheet is a summary. It may not cover all possible information. If you have questions about this medicine, talk to your doctor, pharmacist, or health care provider.  2021 Elsevier/Gold Standard (2018-12-12 13:37:23)    If you develop nausea and vomiting that is not controlled by your nausea medication, call the clinic.   BELOW ARE SYMPTOMS THAT SHOULD BE REPORTED IMMEDIATELY:  *FEVER GREATER THAN 100.5 F  *CHILLS WITH OR   WITHOUT FEVER  NAUSEA AND VOMITING THAT IS NOT CONTROLLED WITH YOUR NAUSEA MEDICATION  *UNUSUAL SHORTNESS OF BREATH  *UNUSUAL BRUISING OR BLEEDING  TENDERNESS IN MOUTH AND THROAT WITH OR WITHOUT PRESENCE OF ULCERS  *URINARY PROBLEMS  *BOWEL PROBLEMS  UNUSUAL RASH Items with * indicate a potential emergency and should be followed up as soon as possible.  Feel free to call the clinic should you have any questions or  concerns at The clinic phone number is [phone].  Please show the CHEMO ALERT CARD at check-in to the Emergency Department and triage nurse.   

## 2020-03-31 NOTE — Progress Notes (Signed)
[place]  [address] [place],  [place]  [id number] [phone]  Clinic Day:  04/01/2020  Referring physician: Guadalupe Maple, MD   This document serves as a record of services personally performed by Hosie Poisson, MD. It was created on their behalf by [facility], a trained medical scribe. The creation of this record is based on the scribe's personal observations and the provider's statements to them.  CHIEF COMPLAINT:  CC: An 87 year old with Invasive ductal carcinoma of the right breast here for 3 week pre-chemotherapy evaluation.  Current Treatment:  Will plan for surgical resection, and following her procedure we will resume trastuzumab/pertuzumab every 3 weeks.  HISTORY OF PRESENT ILLNESS:  Brandy Jacobs is a 87 y.o. female with invasive ductal carcinoma of the right breast, diagnosed in December 2021.Ultrasound guided biopsy was pursued and surgical pathology from that procedure confirmed invasive ductal carcinoma, grade 2/3, and DCIS with central necrosis.  HER2 was positive, estrogen receptor was positive at 95% and progesterone receptor was positive at 30%.  Ki67 was 40%.  As she is HER2 positive, we recommended neoadjuvant chemotherapy with paclitaxel weekly for 9 doses and Herceptin/ Perjeta every 3 weeks for a total of 1 year. She received her first cycle on January 4th. She underwent a stereotactic biopsy of an area in the right breast inferior to the tumor on January 24th to help decide about future surgery.  Pathology revealed atypical ductal hyperplasia.  She has had problems with nausea and diarrhea resulting in dehydration and hypokalemia.  We ended up skipping day 15 of cycle 2.  She did receive IV potassium.  We did administer day 1 and day 8 of cycle 3 of paclitaxel.    INTERVAL HISTORY:  Darchelle is here for follow up prior to a potential day 15 of cycle 3 paclitaxel/trastuzumab/pertuzumab.  She did report diarrhea following day 8,  but states that this resolved on its own.  She has occasional pain of her stomach and legs.  She also reports neuropathy of her hands and feet.  Following her last dose of chemotherapy she did experience insomnia for two days, likely from the corticosteroids.  White count has decreased from 4.5 to 2.8 with an Oso of 1430, her hemoglobin is stable at 10.9, and her platelet count is normal.  Chemistries are unremarkable except for a potassium of 3.4, and a bilirubin of 1.9, previously 1.6.  I advised that we skip her last dose of chemotherapy due to diarrhea, cytopenias, weight loss and her age. 87  Her  appetite is good, and she has lost 4 pounds since her last visit.  She denies fever, chills or other signs of infection.  She denies nausea, vomiting, bowel issues, or abdominal pain.  She denies sore throat, cough, dyspnea, or chest pain.  REVIEW OF SYSTEMS:  Review of Systems  Constitutional: Positive for fatigue. Negative for appetite change, chills, fever and unexpected weight change.  HENT:  Negative.   Eyes: Negative.   Respiratory: Negative.  Negative for chest tightness, cough, hemoptysis, shortness of breath and wheezing.   Cardiovascular: Negative.  Negative for chest pain, leg swelling and palpitations.  Gastrointestinal: Positive for abdominal pain (occasional) and diarrhea. Negative for abdominal distention, blood in stool, constipation, nausea and vomiting.  Endocrine: Negative.   Genitourinary: Negative.  Negative for difficulty urinating, dysuria, frequency and hematuria.   Musculoskeletal: Positive for arthralgias. Negative for back pain, flank pain, gait problem and myalgias.  Skin: Negative.   Neurological:  Positive for numbness (of the hands and feet). Negative for dizziness, extremity weakness, gait problem, headaches, light-headedness, seizures and speech difficulty.  Hematological: Negative.   Psychiatric/Behavioral: Positive for sleep disturbance (insomnia). Negative for  depression. The patient is not nervous/anxious.      VITALS:  Blood pressure (!) 153/69, pulse 77, temperature 98.1 F (36.7 C), temperature source Oral, resp. rate 18, height 5' 3.5" (1.613 m), weight 148 lb 14.4 oz (67.5 kg), SpO2 95 %.  Wt Readings from Last 3 Encounters:  04/01/20 148 lb 14.4 oz (67.5 kg)  03/26/20 147 lb 12 oz (67 kg)  03/24/20 152 lb 14.4 oz (69.4 kg)    Body mass index is 25.96 kg/m.  Performance status (ECOG): 1 - Symptomatic but completely ambulatory  PHYSICAL EXAM:  Physical Exam Constitutional:      General: She is not in acute distress.    Appearance: Normal appearance. She is normal weight.  HENT:     Head: Normocephalic and atraumatic.  Eyes:     General: No scleral icterus.    Extraocular Movements: Extraocular movements intact.     Conjunctiva/sclera: Conjunctivae normal.     Pupils: Pupils are equal, round, and reactive to light.  Cardiovascular:     Rate and Rhythm: Normal rate and regular rhythm.     Pulses: Normal pulses.     Heart sounds: Normal heart sounds. No murmur heard. No friction rub. No gallop.   Pulmonary:     Effort: Pulmonary effort is normal. No respiratory distress.     Breath sounds: Normal breath sounds.  Chest:     Comments: I cannot even palpate the prior mass that was in the upper outer quadrant of the right breast now. Abdominal:     General: Bowel sounds are normal. There is no distension.     Palpations: Abdomen is soft. There is no hepatomegaly, splenomegaly or mass.     Tenderness: There is no abdominal tenderness.  Musculoskeletal:        General: Normal range of motion.     Cervical back: Normal range of motion and neck supple.     Right lower leg: No edema.     Left lower leg: No edema.  Lymphadenopathy:     Cervical: No cervical adenopathy.  Skin:    General: Skin is warm and dry.  Neurological:     General: No focal deficit present.     Mental Status: She is alert and oriented to person, place, and  time. Mental status is at baseline.  Psychiatric:        Mood and Affect: Mood normal.        Behavior: Behavior normal.        Thought Content: Thought content normal.        Judgment: Judgment normal.    LABS:   CBC Latest Ref Rng & Units 04/01/2020 03/24/2020 03/17/2020  WBC - 2.8 4.5 6.0  Hemoglobin 12.0 - 16.0 10.9(A) 10.9(A) 10.5(A)  Hematocrit 36 - 46 33(A) 33(A) 31(A)  Platelets 150 - 399 221 209 219   CMP Latest Ref Rng & Units 04/01/2020 03/24/2020 03/17/2020  BUN 4 - _0 Creatinine 0.5 - 1.1 0.7 0.7 0.7  Sodium 137 - 147 138 136(A) 143  Potassium 3.4 - 5.3 3.4 3.7 3.6  Chloride 99 - 108 102 102 104  CO2 13 - 22 30(A) 28(A) 32(A)  Calcium 8.7 - 10.7 9.4 9.2 9.2  Total Protein 6.3 - 8.2 g/dL - - -  Alkaline Phos 25 - 125 75 71 79  AST 13 - 35 _0 ALT 7 - 35 _1 STUDIES:   No current studies  HISTORY:   Allergies: No Known Allergies  Current Medications: Current Outpatient Medications  Medication Sig Dispense Refill  . acetaminophen (TYLENOL) 500 MG tablet Take 500 mg by mouth at bedtime.    Marland Kitchen aspirin EC 81 MG tablet Take 81 mg by mouth daily. Swallow whole.    Marland Kitchen atorvastatin (LIPITOR) 40 MG tablet Take 40 mg by mouth daily.    Marland Kitchen dexamethasone (DECADRON) 4 MG tablet Take 1 tablet (4 mg total) by mouth as directed. 3 pills night before and 3 pills morning of first chemo, then 1 pill night before and morning of weekly chemo 30 tablet 2  . Loperamide HCl (IMODIUM PO) Take by mouth. Over the counter imodium, patient is taking    . losartan (COZAAR) 50 MG tablet Take 50 mg by mouth daily.    Marland Kitchen omeprazole (PRILOSEC) 40 MG capsule Take 1 capsule (40 mg total) by mouth daily. 30 capsule 3  . ondansetron (ZOFRAN) 4 MG tablet Take 1 tablet (4 mg total) by mouth every 4 (four) hours as needed for nausea or vomiting. 30 tablet 5  . oxyCODONE (OXY IR/ROXICODONE) 5 MG immediate release tablet SMARTSIG:1 Tablet(s) By Mouth 4-5 Times Daily    . predniSONE  (DELTASONE) 5 MG tablet Take 2.5 mg by mouth 2 (two) times a week. TAKES 1/2 TABLET OF 5MG TWICE A WEEK    . prochlorperazine (COMPAZINE) 10 MG tablet Take 1 tablet (10 mg total) by mouth every 6 (six) hours as needed for nausea or vomiting. 30 tablet 5  . Vitamin D3 (VITAMIN D) 25 MCG tablet Take 1,000 Units by mouth daily.     No current facility-administered medications for this visit.     ASSESSMENT & PLAN:   Assessment:   1. 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  She is receiving neoadjuvant paclitaxel/trastuzumab/pertuzumab and clearly responding to this, but we will skip her last dose.  We will now plan for surgical resection and so will refer her back to Dr. Lilia Pro.  I will get mammogram imaging of the right breast in 2 weeks, so that he will have that by the time of his appointment, and can plan surgery in early April.  Once she recovers, we will resume HER2 targeted therapy with trastuzumab/pertuzumab every 3 weeks.  She will also later receive hormonal therapy.  Plan:  Due to her cytopenias and as she has struggled with chemotherapy, we will skip her last dose.  We will now plan to proceed with surgical resection and so we will refer her back to Dr. Lilia Pro.  I will obtainright mammogram from comparison after neoadjuvant therapy.  Once she recovers, we can resume HER2 targeted therapy with trastuzumab/pertuzumab, and hormonal therapy.  We will cancel her infusion appointment on March 10th.  She and her daughter verbalized understanding of and agreement to the plans discussed today. They know to call the office should any new questions or concerns arise.    Derwood Kaplan, MD [facility] AT [facility] [address] [place] [address] Dept: [phone] Dept Fax: [phone]    I, Rita Ohara, am acting as scribe  for Derwood Kaplan, MD  I have reviewed  this report as typed by the medical scribe, and it is complete and accurate.

## 2020-04-01 ENCOUNTER — Other Ambulatory Visit: Payer: Self-pay

## 2020-04-01 ENCOUNTER — Inpatient Hospital Stay: Payer: Medicare Other

## 2020-04-01 ENCOUNTER — Other Ambulatory Visit: Payer: Self-pay | Admitting: Hematology and Oncology

## 2020-04-01 ENCOUNTER — Other Ambulatory Visit: Payer: Self-pay | Admitting: Oncology

## 2020-04-01 ENCOUNTER — Encounter: Payer: Self-pay | Admitting: Oncology

## 2020-04-01 ENCOUNTER — Inpatient Hospital Stay (INDEPENDENT_AMBULATORY_CARE_PROVIDER_SITE_OTHER): Payer: Medicare Other | Admitting: Oncology

## 2020-04-01 VITALS — BP 153/69 | HR 77 | Temp 98.1°F | Resp 18 | Ht 63.5 in | Wt 148.9 lb

## 2020-04-01 DIAGNOSIS — C50411 Malignant neoplasm of upper-outer quadrant of right female breast: Secondary | ICD-10-CM

## 2020-04-01 DIAGNOSIS — Z170 Estrogen receptor positive status [ER+]: Secondary | ICD-10-CM

## 2020-04-01 DIAGNOSIS — Z171 Estrogen receptor negative status [ER-]: Secondary | ICD-10-CM

## 2020-04-01 LAB — BASIC METABOLIC PANEL
BUN: 13 (ref 4–21)
CO2: 30 — AB (ref 13–22)
Chloride: 102 (ref 99–108)
Creatinine: 0.7 (ref 0.5–1.1)
Glucose: 121
Potassium: 3.4 (ref 3.4–5.3)
Sodium: 138 (ref 137–147)

## 2020-04-01 LAB — CBC AND DIFFERENTIAL
HCT: 33 — AB (ref 36–46)
Hemoglobin: 10.9 — AB (ref 12.0–16.0)
Neutrophils Absolute: 1.43
Platelets: 221 (ref 150–399)
WBC: 2.8

## 2020-04-01 LAB — COMPREHENSIVE METABOLIC PANEL
Albumin: 3.8 (ref 3.5–5.0)
Calcium: 9.4 (ref 8.7–10.7)

## 2020-04-01 LAB — HEPATIC FUNCTION PANEL
ALT: 20 (ref 7–35)
AST: 24 (ref 13–35)
Alkaline Phosphatase: 75 (ref 25–125)
Bilirubin, Total: 1.9

## 2020-04-01 LAB — MAGNESIUM: Magnesium: 1.7

## 2020-04-01 LAB — CBC: RBC: 3.58 — AB (ref 3.87–5.11)

## 2020-04-01 MED FILL — Dexamethasone Sodium Phosphate Inj 100 MG/10ML: INTRAMUSCULAR | Qty: 1 | Status: AC

## 2020-04-01 MED FILL — Paclitaxel IV Conc 300 MG/50ML (6 MG/ML): INTRAVENOUS | Qty: 16 | Status: AC

## 2020-04-02 ENCOUNTER — Inpatient Hospital Stay: Payer: Medicare Other

## 2020-04-02 ENCOUNTER — Telehealth: Payer: Self-pay

## 2020-04-02 NOTE — Telephone Encounter (Signed)
-----   Message from Derwood Kaplan, MD sent at 04/01/2020  2:37 PM EST ----- Regarding: appt She is done with neoadjuvant chemo and good response by exam.  Now will get right mammo in 2 weeks and then want her to see her surgeon, Dr. Lilia Pro (he has seen before) and she will need right lumpectomy by early April.  So make appt for him to see her end of March, thanks

## 2020-04-02 NOTE — Telephone Encounter (Signed)
Scheduled patient with Dr. Lilia Pro on Tuesday, April 21, 2020 at 10:00am. Myrtie Cruise that takes care of patient appointments called and notified of the appointment date and time.

## 2020-04-07 ENCOUNTER — Encounter: Admit: 2020-04-07 | Discharge: 2020-04-07 | Payer: MEDICARE | Primary: Internal Medicine

## 2020-04-07 NOTE — Home Health (Signed)
Home Health   by Renelda Loma at 04/07/20 1203                Author: Renelda Loma  Service: --  Author Type: Registered Nurse       Filed: 04/07/20 1517  Date of Service: 04/07/20 1203  Status: Signed          Editor: Renelda Loma (Registered Nurse)               Problem: Right heel unstageable 3.5x3.5x0, black eschar, dry, tender, Right buttock x2 stage II pressure injuries 0.5x0.5x0.1, pink, small SS drianage.      Intervention: HHC SN SOC: Patient admitted to [facility] for above wounds, recently transferred to ALF/MC with spouse. Alert to self only, confused, follows commands, unable to reorient. Appears that she had a recent fall, face is bruised purple/yellow, has  transport wheelchair only, able to bear weight for short periods, weakness, unsteady gait, fall risk, deconditioning, will have PT/OT evaluation and treatment. Risk for bleeding on Plavix. Right heel will need debridment to determine depth, recommend  daily wound care for now with Silver Ag gel. Pressure injuries, open pink, small drainage no S/S of infection, skin prep/foam drsg 3xw, offloading/pressure injury prevention, instructed patient and caregivers. VS/SATS WNL, +1 BLLE edema, lungs clear,  voices no further complaints at this time. Medications reconciled and updated on MAR. Consents Emailed to [employer]. Instructed on [facility] SOC when to call/911, wound care/diet, risk for bleeding, medications/disease management, safety/fall risks, infection  prevention, and plan of care, needs reinforcement.       Goal: Patient will be safe at home free from falls/injury/infection, wound healed, free from complications of bleeding, rehabilitate to optimal level of function.          This patient has answered YES/NO to the following questions:    1) have you traveled outside the country N   2) have you been exposed to or been in contact with anyone whom traveled outside the country in the past two weeks N   3) do you have a sore throat/fever/dry  cough N              4)The patient has been educated on and demonstrates good understanding via the teach-back method for the following: Signs and symptoms of COVID-19 Y

## 2020-04-07 NOTE — Home Health (Signed)
Home Health   by Viviana Simpler at 04/07/20 1025                Author: Viviana Simpler  Service: --  Author Type: Physical Therapist       Filed: 04/08/20 1631  Date of Service: 04/07/20 1025  Status: Signed          Editor: Viviana Simpler (Physical Therapist)               Pt is an 87 yo RHD female referred to PT s/p fall.  Pt resides in ALF - memory care with her husband. She has a transport WC and needs a WC she can lock and be mobile.        Problem:   Pt presents with forward head, forward lumbar flexion with gait. Pt demonstrates BLE muscle weakness, impaired dynamic balance, limited standing tolerance and decreased endurance causing difficulty performing safe functional transfers from  various surfaces throughout home, standing activities and bed mobility without assistance as able to do in PLOF. TUG is 24 which indicates a high fall risk. Pt is homebound due to being at high risk for falls and requires the assistance of another person  to leave home safely.       Intervention: Pt to receive skilled PT 2w4, 1w1 for stretching, strengthening, safety training, transfer training, dynamic balance training and gait training effective 04/07/20.      Goals: Patient will safely perform all transfers mod I utilizing equipment as needed within 2 weeks. Patient will demonstrate increased B LE muscle strength by one grade for purpose of mod I transfers within 4 weeks. Patient is able to ambulate 600 feet  x 2 with 2ww mod I to safely ambulate from bedroom to the dining room within 4 weeks. The patient will demonstrate an improvement in standing balance noted TUG score improvement of 3 seconds or more with an increased functional ability as seen by mod  I shower transfers within 4 weeks.      Patient will benefit from PT for strength, balance, transfer, gait training, Pt/CG education on fall prevention and HEP.

## 2020-04-08 ENCOUNTER — Encounter: Admit: 2020-04-08 | Discharge: 2020-04-08 | Payer: MEDICARE | Primary: Internal Medicine

## 2020-04-08 ENCOUNTER — Encounter: Primary: Internal Medicine

## 2020-04-08 NOTE — Home Health (Signed)
Home Health   by Renelda Loma at 04/08/20 1200                Author: Renelda Loma  Service: --  Author Type: Registered Nurse       Filed: 04/08/20 1337  Date of Service: 04/08/20 1200  Status: Signed          Editor: Renelda Loma (Registered Nurse)               Problem: Pressure injury Stage II to Right buttocks inner x 2 0.5x0.5x0.1, Right heel unstagable 3x3x0.1 black eschar, small SS drianage since starting treatment.      Intervention: Patient is doing well, VS WNL, voices no complaints at this time. Continue wound care Buttock wound drsg CDI, continue offloading, Heel beginning to debride, changed per POC. Instructed on medications/disease management, safety/fall risks,  infection prevention, and plan of care, taught back understanding.       Goal: Patient will be safe at home free from falls/injury/infection, wounds healed, free from complications of bleeding.          This patient has answered YES/NO to the following questions:    1) have you traveled outside the country N   2) have you been exposed to or been in contact with anyone whom traveled outside the country in the past two weeks N   3) do you have a sore throat/fever/dry cough N              4)The patient has been educated on and demonstrates good understanding via the teach-back method for the following: Signs and symptoms of COVID-19 Y

## 2020-04-09 ENCOUNTER — Encounter: Admit: 2020-04-09 | Discharge: 2020-04-09 | Payer: MEDICARE | Primary: Internal Medicine

## 2020-04-09 ENCOUNTER — Encounter: Primary: Internal Medicine

## 2020-04-09 NOTE — Home Health (Signed)
 Patient with wound on buttucks and heel     Wound Care Provided per care plan. Pt tolerated well.     Caregiver involvement: ALF    Medications reconciled and all medications are available .     Home health supplies by type and quantity ordered/delivered     Patient education provided this visit: SN educated patient and patient's caregiver on s/s.of infectiin Patient and patient caregiver verbalizes understanding via the teach back method.     Progress toward goals: progressing well    The following discharge planning was discussed with the patient/ patient's caregiver: DC when goals met

## 2020-04-09 NOTE — Home Health (Signed)
S)  No out ward expression of pain and , or discomfort noted at tine of visit.   O)  Rx:  PROM to B LE for prolonged stretch and hold in the end range , D 1-D2 PNF pattern, with distraction + oscillation to increase capsular blood flow and for increased functional ROM.  Static strengthening with hold in the end range x 3-5 seconds :    -AP QS GS SAQ LAQ SLR   B Hip AD AB FLX ER IR.  Standing weight shifts==> all motions for dynamic balance training,     A)  Dementia capable approach to assist patient in sequence and processing of all task demands.  Contestant cuing, redirection , encouragement required for participation in, to stay on task during and for safe completion of the rehab session. Conative being a barrier to HEP compliance with review and instruction given each visit with emphasis on safety awareness, energy conservation all for fall prevention.  All activity working below pain threshold requiring brief periods of rest , cuing , and assistance for safe completion while monitoring vitals through out the rehab session.  P)  Continue with current plan of care for increased functional dynamic ability during ADL movement to obtain greater patient independence , ease of care , fall prevention - balance correction .B LE strengthening , PROM , Gait , and xfer training all requiring min<--> mod assistance , cuing and periods of rest to complete the rehab session. All activity to increased functional dynamic ability during ADL movement for fall prevention , ease of care with ALF staff and greater patient  independence.

## 2020-04-10 ENCOUNTER — Encounter: Primary: Internal Medicine

## 2020-04-10 ENCOUNTER — Encounter: Admit: 2020-04-10 | Discharge: 2020-04-10 | Payer: MEDICARE | Primary: Internal Medicine

## 2020-04-10 NOTE — Home Health (Signed)
Problem: Buttock pressure injury, 0.5x0.2x0.1, pink, dry, distal one resolved, RLE wound unstageable black, eschar, small SS drainage 3.5x3.5x0.    Intervention: Patient is doing well, VS WNL, voices no complaints at this time. Wound care per POC.  Instructed on medications/disease management, safety/fall risks, infection prevention, and plan of care, taught back understanding.     Goal: Patient will be safe at home free from falls/injury/infection, wounds healed, rehabilitate to optimal level of function.       This patient has answered YES/NO to the following questions:   1) have you traveled outside the country N  2) have you been exposed to or been in contact with anyone whom traveled outside the country in the past two weeks N  3) do you have a sore throat/fever/dry cough N             4)The patient has been educated on and demonstrates good understanding via the teach-back method for the following: Signs and symptoms of COVID-19 Y

## 2020-04-11 ENCOUNTER — Encounter: Admit: 2020-04-11 | Discharge: 2020-04-11 | Payer: MEDICARE | Primary: Internal Medicine

## 2020-04-11 NOTE — Home Health (Signed)
Patient with presure injury on rt heel, Patient is a hospice patient now    Wound Care Provided per care plan. Pt tolerated well.     Caregiver involvement: ALF    Medications reconciled and all medications are available     Home health supplies by type and quantity ordered/delivered this visit include    Patient education provided this visit: SN educated patient and patient's caregiver on s/s of infection Patient and patient caregiver verbalizes understanding via the teach back method.     Progress toward goals: progressing well      The following discharge planning was discussed with the patient/ patient's caregiver: DC when goals met

## 2020-04-12 ENCOUNTER — Encounter: Primary: Internal Medicine

## 2020-04-13 ENCOUNTER — Encounter: Primary: Internal Medicine

## 2020-04-14 ENCOUNTER — Encounter: Primary: Internal Medicine

## 2020-04-16 ENCOUNTER — Encounter: Primary: Internal Medicine

## 2020-04-17 ENCOUNTER — Encounter: Primary: Internal Medicine

## 2020-04-18 ENCOUNTER — Encounter: Primary: Internal Medicine

## 2020-04-20 ENCOUNTER — Encounter: Primary: Internal Medicine

## 2020-04-21 ENCOUNTER — Encounter: Primary: Internal Medicine

## 2020-04-22 ENCOUNTER — Encounter: Primary: Internal Medicine

## 2020-04-24 ENCOUNTER — Encounter: Primary: Internal Medicine

## 2020-04-28 ENCOUNTER — Encounter: Primary: Internal Medicine

## 2020-05-01 ENCOUNTER — Encounter: Primary: Internal Medicine

## 2020-05-04 HISTORY — PX: BREAST LUMPECTOMY W/ NEEDLE LOCALIZATION: SHX1266

## 2020-05-05 ENCOUNTER — Encounter: Primary: Internal Medicine

## 2020-05-19 ENCOUNTER — Telehealth: Payer: Self-pay | Admitting: Hematology and Oncology

## 2020-05-19 NOTE — Telephone Encounter (Signed)
Brandy Jacobs

## 2020-05-21 ENCOUNTER — Other Ambulatory Visit: Payer: Self-pay

## 2020-05-21 ENCOUNTER — Telehealth: Payer: Self-pay | Admitting: Hematology and Oncology

## 2020-05-21 ENCOUNTER — Encounter: Payer: Self-pay | Admitting: Hematology and Oncology

## 2020-05-21 ENCOUNTER — Inpatient Hospital Stay: Payer: Medicare Other | Attending: Oncology | Admitting: Hematology and Oncology

## 2020-05-21 ENCOUNTER — Other Ambulatory Visit: Payer: Self-pay | Admitting: Pharmacist

## 2020-05-21 VITALS — BP 172/77 | HR 76 | Temp 98.1°F | Resp 18 | Ht 63.5 in | Wt 148.6 lb

## 2020-05-21 DIAGNOSIS — Z13820 Encounter for screening for osteoporosis: Secondary | ICD-10-CM | POA: Diagnosis not present

## 2020-05-21 DIAGNOSIS — C50011 Malignant neoplasm of nipple and areola, right female breast: Secondary | ICD-10-CM | POA: Diagnosis not present

## 2020-05-21 DIAGNOSIS — Z170 Estrogen receptor positive status [ER+]: Secondary | ICD-10-CM

## 2020-05-21 DIAGNOSIS — C50411 Malignant neoplasm of upper-outer quadrant of right female breast: Secondary | ICD-10-CM

## 2020-05-21 NOTE — Telephone Encounter (Signed)
Per 4/28 LOS, patient scheduled for May Appt's.  Gave patient Appt Summary 

## 2020-05-21 NOTE — Progress Notes (Signed)
The following biosimilar Calla Kicks has been selected for use in this patient.

## 2020-05-21 NOTE — Progress Notes (Signed)
Brandy Jacobs  [address] [place],  [place]  [address] [phone]  Clinic Day:  05/21/2020  Referring physician: Guadalupe Maple, MD   CHIEF COMPLAINT:  CC:  Clinical stage IB hormone and HER2 receptor positive breast cancer status post lumpectomy  Current Treatment:   Evaluate for adjuvant HER2 targeted therapy   HISTORY OF PRESENT ILLNESS:  Brandy Jacobs is a 87 y.o. 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 She presented for annual bilateral mammogram and had a palpable upper right breast mass, so underwent diagnostic bilateral mammogram and ultrasound.  There was a 2.9 cm upper right breast mass highly suspicious for malignancy.  No abnormal right axillary lymph nodes were seen in left breast did not reveal any evidence of malignancy. 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 Estrogen and progesterone receptors were positive and HER2 positive.  Ki67 was 40%.  Due to the HER2 positivity, we recommended neoadjuvant therapy with trastuzumab, pertuzumab and weekly paclitaxel weekly for 3 cycles. She received her first cycle in January. She underwent an additional stereotactic biopsy of an area in the inferior right breast later in January to guide surgery.  Pathology revealed atypical ductal hyperplasia.  She had difficulty tolerating therapy, mainly due to diarrhea, causing dehydration and hypokalemia, but she also had cytopenias. She completed neoadjuvant therapy on March 3rd.  We skipped 2 doses of weekly paclitaxel due to toxicities. She was then referred back to Dr. Lilia Pro for definitive surgery.  She underwent lumpectomy on April 11th.  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  Estrogen and progesterone  receptors were positive and HER2 positive.  Ki-67 was less than 5%.  INTERVAL HISTORY:  Brandy Jacobs is here today for repeat clinical assessment prior to adjuvant HER2 targeted therapy with trastuzumab/pertuzumab every 3 weeks.  She had an excellent response to neoadjuvant trastuzumab/pertuzumab/paclitaxel. She reports mild fatigue.  She has had difficulty with memory as well as expression since her stroke. 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 She denies fevers or chills. She denies pain. Her appetite is decreased, but she is eating. Her weight has been stable.  Her daughter states that she has had elevated calcium before, so does not take oral calcium supplement.  She is on vitamin-D 1000 international units daily. She had blood work at Dr. Deborra Medina office last week and is apparently still anemic.  REVIEW OF SYSTEMS:  Review of Systems  Constitutional: Positive for appetite change (Decreased). Negative for chills, fatigue, fever and unexpected weight change.  HENT:   Negative for lump/mass, mouth sores and sore throat.   Respiratory: Negative for cough and shortness of breath.   Cardiovascular: Negative for chest pain and leg swelling.  Gastrointestinal: Negative for abdominal pain, constipation, diarrhea, nausea and vomiting.  Endocrine: Negative for hot flashes.  Genitourinary: Negative for difficulty urinating, dysuria, frequency and hematuria.   Musculoskeletal: Negative for arthralgias, back pain and myalgias.  Skin: Negative for rash.  Neurological: Positive for numbness (Bilateral lower extremities). Negative for dizziness and headaches.  Hematological: Negative for adenopathy. Does not bruise/bleed easily.  Psychiatric/Behavioral: Negative for depression and sleep disturbance. The patient is not nervous/anxious.    VITALS:  Blood pressure (!) 172/77, pulse 76, temperature 98.1 F (36.7 C), temperature source Oral,  resp. rate 18, height 5' 3.5"  (1.613 m), weight 148 lb 9.6 oz (67.4 kg), SpO2 96 %.  Wt Readings from Last 3 Encounters:  05/21/20 148 lb 9.6 oz (67.4 kg)  04/01/20 148 lb 14.4 oz (67.5 kg)  03/26/20 147 lb 12 oz (67 kg)    Body mass index is 25.91 kg/m.  Performance status (ECOG): 0 - Asymptomatic  PHYSICAL EXAM:  Physical Exam Vitals and nursing note reviewed.  Constitutional:      General: She is not in acute distress.    Appearance: Normal appearance.  HENT:     Head: Normocephalic and atraumatic.     Mouth/Throat:     Mouth: Mucous membranes are moist.     Pharynx: Oropharynx is clear. No oropharyngeal exudate or posterior oropharyngeal erythema.  Eyes:     General: No scleral icterus.    Extraocular Movements: Extraocular movements intact.     Conjunctiva/sclera: Conjunctivae normal.     Pupils: Pupils are equal, round, and reactive to light.  Cardiovascular:     Rate and Rhythm: Normal rate and regular rhythm.     Heart sounds: Normal heart sounds. No murmur heard. No friction rub. No gallop.   Pulmonary:     Effort: Pulmonary effort is normal.     Breath sounds: Normal breath sounds. No wheezing, rhonchi or rales.  Chest:  Breasts:     Right: Normal. No swelling, bleeding, inverted nipple, mass, nipple discharge, skin change, tenderness, axillary adenopathy or supraclavicular adenopathy.     Left: Normal. No swelling, bleeding, inverted nipple, mass, nipple discharge, skin change, tenderness, axillary adenopathy or supraclavicular adenopathy.      Comments:  The right breast incision is healing well Abdominal:     General: There is no distension.     Palpations: Abdomen is soft. There is no hepatomegaly, splenomegaly or mass.     Tenderness: There is no abdominal tenderness.  Musculoskeletal:        General: Normal range of motion.     Cervical back: Normal range of motion and neck supple. No tenderness.     Right lower leg: No edema.     Left lower leg: No edema.  Lymphadenopathy:      Cervical: No cervical adenopathy.     Upper Body:     Right upper body: No supraclavicular or axillary adenopathy.     Left upper body: No supraclavicular or axillary adenopathy.     Lower Body: No right inguinal adenopathy. No left inguinal adenopathy.  Skin:    General: Skin is warm and dry.     Coloration: Skin is not jaundiced.     Findings: No rash.  Neurological:     Mental Status: She is alert and oriented to person, place, and time.     Cranial Nerves: No cranial nerve deficit.  Psychiatric:        Mood and Affect: Mood normal.        Behavior: Behavior normal.        Thought Content: Thought content normal.    LABS:   CBC Latest Ref Rng & Units 04/01/2020 03/24/2020 03/17/2020  WBC - 2.8 4.5 6.0  Hemoglobin 12.0 - 16.0 10.9(A) 10.9(A) 10.5(A)  Hematocrit 36 - 46 33(A) 33(A) 31(A)  Platelets 150 - 399 221 209 219   CMP Latest Ref Rng & Units 04/01/2020 03/24/2020 03/17/2020  BUN 4 - '21 13 14 14  ' Creatinine 0.5 - 1.1 0.7 0.7 0.7  Sodium 137 - 147 138 136(A)  143  Potassium 3.4 - 5.3 3.4 3.7 3.6  Chloride 99 - 108 102 102 104  CO2 13 - 22 30(A) 28(A) 32(A)  Calcium 8.7 - 10.7 9.4 9.2 9.2  Total Protein 6.3 - 8.2 g/dL - - -  Alkaline Phos 25 - 125 75 71 79  AST 13 - 35 '24 25 24  ' ALT 7 - 35 '20 31 19     ' No results found for: CEA1 / No results found for: CEA1 No results found for: PSA1 No results found for: WOE321 No results found for: YYQ825  No results found for: TOTALPROTELP, ALBUMINELP, A1GS, A2GS, BETS, BETA2SER, GAMS, MSPIKE, SPEI No results found for: TIBC, FERRITIN, IRONPCTSAT No results found for: LDH  STUDIES:  No results found.    HISTORY:   Past Medical History:  Diagnosis Date  . Arthritis    osteoarthritis  . Breast cancer ([facility] [place])   . Diabetes mellitus without complication ([place])    not on medication  . History of CVA (cerebrovascular accident)   . Hypertension   . Multiple thyroid nodules    benign    Past Surgical History:  Procedure  Laterality Date  . BIOPSY THYROID     benign  . BREAST BIOPSY      Family History  Problem Relation Age of Onset  . Colon cancer Father        40s  . Breast cancer Sister        15-60  . Colon cancer Brother        19s  . Prostate cancer Brother        late 2s  . Breast cancer Half-Sister        late 68s early 79s    Social History:  reports that she has never smoked. She has never used smokeless tobacco. She reports previous alcohol use. She reports that she does not use drugs.The patient is accompanied by her daughter today.  Allergies: No Known Allergies  Current Medications: Current Outpatient Medications  Medication Sig Dispense Refill  . diphenhydrAMINE (BENADRYL) 25 mg capsule Take 25 mg by mouth at bedtime as needed. At night as needed for itching    . acetaminophen (TYLENOL) 500 MG tablet Take 500 mg by mouth at bedtime.    Marland Kitchen aspirin EC 81 MG tablet Take 81 mg by mouth daily. Swallow whole.    Marland Kitchen atorvastatin (LIPITOR) 40 MG tablet Take 40 mg by mouth daily.    Marland Kitchen dexamethasone (DECADRON) 4 MG tablet Take 1 tablet (4 mg total) by mouth as directed. 3 pills night before and 3 pills morning of first chemo, then 1 pill night before and morning of weekly chemo 30 tablet 2  . Loperamide HCl (IMODIUM PO) Take by mouth. Over the counter imodium, patient is taking    . losartan (COZAAR) 50 MG tablet Take 50 mg by mouth daily.    Marland Kitchen omeprazole (PRILOSEC) 40 MG capsule Take 1 capsule (40 mg total) by mouth daily. 30 capsule 3  . ondansetron (ZOFRAN) 4 MG tablet Take 1 tablet (4 mg total) by mouth every 4 (four) hours as needed for nausea or vomiting. 30 tablet 5  . oxyCODONE (OXY IR/ROXICODONE) 5 MG immediate release tablet SMARTSIG:1 Tablet(s) By Mouth 4-5 Times Daily    . predniSONE (DELTASONE) 5 MG tablet Take 2.5 mg by mouth 2 (two) times a week. TAKES 1/2 TABLET OF 5MG TWICE A WEEK    . prochlorperazine (COMPAZINE) 10 MG tablet Take 1 tablet (10 mg  total) by mouth every 6  (six) hours as needed for nausea or vomiting. 30 tablet 5  . Vitamin D3 (VITAMIN D) 25 MCG tablet Take 1,000 Units by mouth daily.     No current facility-administered medications for this visit.    ASSESSMENT & PLAN:   Assessment/Plan:  Brandy Jacobs is a 87 y.o. female  clinical stage IB hormone and HER2 receptor positive.  She  Received neoadjuvant trastuzumab/pertuzumab/ paclitaxel and has now had a lumpectomy .  She had an excellent response to neoadjuvant trastuzumab/pertuzumab/paclitaxel with just a tiny residual invasive carcinoma.  She is recovering well from her surgery, but I will wait until she is 4 weeks postop to start maintenance HER2 targeted therapy with trastuzumab/pertuzumab every 3 weeks for total of a year of therapy.  I will obtain a bone density scan prior to starting adjuvant hormonal therapy. She is a little hesitant to resume  HER2 targeted therapy, but I told her if she has difficulty with it we could stop the pertuzumab and try single agent trastuzumab. We will plan to repeat labs on May 6th and start maintenance trastuzumab/pertuzumab on May 9th.  I will plan to see her back 10 days later to see how she tolerated treatment.  The patient and her daughter understand the plans discussed today and are in agreement with them.  They know to contact our office if she develops issues prior to her next appointment.    Marvia Pickles, PA-C

## 2020-05-22 NOTE — Telephone Encounter (Signed)
Called Dr. Deborra Medina office regarding sending over recent lab results that was done at their office. The front office staff states they will fax it right over.

## 2020-05-22 NOTE — Telephone Encounter (Signed)
-----   Message from Marvia Pickles, PA-C sent at 05/21/2020 10:42 AM EDT ----- Please call Dr. Deborra Medina office in [place] for recent labs. Thanks!

## 2020-05-28 ENCOUNTER — Other Ambulatory Visit: Payer: Self-pay | Admitting: Oncology

## 2020-05-29 ENCOUNTER — Other Ambulatory Visit: Payer: Self-pay | Admitting: Hematology and Oncology

## 2020-05-29 ENCOUNTER — Inpatient Hospital Stay: Payer: Medicare Other | Attending: Oncology

## 2020-05-29 DIAGNOSIS — R17 Unspecified jaundice: Secondary | ICD-10-CM

## 2020-05-29 DIAGNOSIS — C50911 Malignant neoplasm of unspecified site of right female breast: Secondary | ICD-10-CM | POA: Insufficient documentation

## 2020-05-29 DIAGNOSIS — C50411 Malignant neoplasm of upper-outer quadrant of right female breast: Secondary | ICD-10-CM

## 2020-05-29 DIAGNOSIS — Z5112 Encounter for antineoplastic immunotherapy: Secondary | ICD-10-CM | POA: Insufficient documentation

## 2020-05-29 DIAGNOSIS — Z170 Estrogen receptor positive status [ER+]: Secondary | ICD-10-CM | POA: Insufficient documentation

## 2020-05-29 DIAGNOSIS — Z79899 Other long term (current) drug therapy: Secondary | ICD-10-CM | POA: Insufficient documentation

## 2020-05-29 LAB — BASIC METABOLIC PANEL
BUN: 21 (ref 4–21)
CO2: 30 — AB (ref 13–22)
Chloride: 104 (ref 99–108)
Creatinine: 0.7 (ref 0.5–1.1)
Glucose: 132
Potassium: 4.1 (ref 3.4–5.3)
Sodium: 141 (ref 137–147)

## 2020-05-29 LAB — CBC AND DIFFERENTIAL
HCT: 37 (ref 36–46)
Hemoglobin: 12.2 (ref 12.0–16.0)
Neutrophils Absolute: 3.54
Platelets: 193 (ref 150–399)
WBC: 5.8

## 2020-05-29 LAB — HEPATIC FUNCTION PANEL
ALT: 20 (ref 7–35)
AST: 24 (ref 13–35)
Alkaline Phosphatase: 73 (ref 25–125)
Bilirubin, Total: 1.7

## 2020-05-29 LAB — CBC: RBC: 3.99 (ref 3.87–5.11)

## 2020-05-29 LAB — COMPREHENSIVE METABOLIC PANEL
Albumin: 4.1 (ref 3.5–5.0)
Calcium: 10.7 (ref 8.7–10.7)

## 2020-05-29 LAB — MAGNESIUM: Magnesium: 2

## 2020-05-29 NOTE — Progress Notes (Signed)
Pt here for lab, as scheduled. Pt reported to lab techs that she fell in her yard yesterday afternoon. Pt laid on ground for about 2 hours per her recollection, before her daughter got there (pt lives alone). Pt fell over on her left side, and reports her left shoulder is sore. She denies SOB, and there isn't deformity noted upon assessment of left shoulder or arm. No bruising. Pt is able to raise both arms above her head, without pain nor difficulty. Bilateral hand grips equal.No bruising noted to the left shoulder nor arm. Left port a cath site unremarkable. Right lumpectomy surgical site with steri strips in place, & healing well.Pt states she was outside looking at her flowers and new kittens. She wasn't using any type of walking aid while in the yard, states the walker wont roll in the yard.  She took some tylenol last night with relief. Pt encouraged to continue to take tylenol as needed for pain and apply ice(for swelling)or heat(for pain) if needed to the shoulder. If she starts having SOB or increase in pain, she should go to the ER for assessment. Pt verbalized understanding. Pt's daughter brought her in today, & I notified her of above as well.  Moise Boring and Otila Kluver in present when I saw pt. I reported the above to [facility].

## 2020-06-01 ENCOUNTER — Inpatient Hospital Stay (INDEPENDENT_AMBULATORY_CARE_PROVIDER_SITE_OTHER): Payer: Medicare Other | Admitting: Hematology and Oncology

## 2020-06-01 ENCOUNTER — Encounter: Payer: Self-pay | Admitting: Hematology and Oncology

## 2020-06-01 ENCOUNTER — Inpatient Hospital Stay: Payer: Medicare Other

## 2020-06-01 ENCOUNTER — Other Ambulatory Visit: Payer: Self-pay

## 2020-06-01 ENCOUNTER — Other Ambulatory Visit: Payer: Self-pay | Admitting: Hematology and Oncology

## 2020-06-01 VITALS — BP 154/70 | HR 69 | Temp 98.6°F | Resp 18 | Ht 63.5 in | Wt 145.8 lb

## 2020-06-01 DIAGNOSIS — C50011 Malignant neoplasm of nipple and areola, right female breast: Secondary | ICD-10-CM | POA: Diagnosis not present

## 2020-06-01 DIAGNOSIS — Z170 Estrogen receptor positive status [ER+]: Secondary | ICD-10-CM

## 2020-06-01 DIAGNOSIS — R17 Unspecified jaundice: Secondary | ICD-10-CM

## 2020-06-01 DIAGNOSIS — T451X5D Adverse effect of antineoplastic and immunosuppressive drugs, subsequent encounter: Secondary | ICD-10-CM

## 2020-06-01 NOTE — Progress Notes (Signed)
[facility]  [address] [place],  [place]  [address] [phone]  Clinic Day:  06/01/2020  Referring physician: Guadalupe Maple, MD   CHIEF COMPLAINT:  CC:  Clinical stage IB hormone and HER2 receptor positive breast cancer status post lumpectomy  Current Treatment:   Evaluate for adjuvant HER2 targeted therapy  HISTORY OF PRESENT ILLNESS:  Brandy Jacobs is a 87 y.o. 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 She presented for annual bilateral mammogram and had a palpable upper right breast mass, so underwent diagnostic bilateral mammogram and ultrasound.  There was a 2.9 cm upper right breast mass highly suspicious for malignancy.  No abnormal right axillary lymph nodes were seen in left breast did not reveal any evidence of malignancy. 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 Estrogen and progesterone receptors were positive and HER2 positive.  Ki67 was 40%.  Due to the HER2 positivity, we recommended neoadjuvant therapy with trastuzumab, pertuzumab and weekly paclitaxel weekly for 3 cycles. She received her first cycle in January. She underwent an additional stereotactic biopsy of an area in the inferior right breast later in January to guide surgery.  Pathology revealed atypical ductal hyperplasia.  She had difficulty tolerating therapy, mainly due to diarrhea, causing dehydration and hypokalemia, but she also had cytopenias. She completed neoadjuvant therapy on March 3rd.  We skipped 2 doses of weekly paclitaxel due to toxicities. She was then referred back to Dr. Lilia Pro for definitive surgery.  She underwent lumpectomy on April 11th.  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  Estrogen and progesterone  receptors were positive and HER2 positive.  Ki-67 was less than 5%.  At her visit at Dr. Deborra Medina office, she was mildly anemic postoperatively.  I had seen her on April 28th to continue adjuvant HER2 targeted therapy with trastuzumab/pertuzumab, but after discussion with Dr. Bobby Rumpf, as she did have a small residual cancer, adjuvant HER2 targeted therapy with Kadcyla (ado-trastuzumab emtansine) is recommended instead.  INTERVAL HISTORY:  [name] is here today to discuss adjuvant HER2 targeted therapy with Kadcyla.  She had an excellent response to neoadjuvant trastuzumab/pertuzumab/paclitaxel, but still had a small residual tumor, so switching from trastuzumab/pertuzumab to [place] [employer] is recommended. Since her last visit, she reports persistent fatigue. She had a fall outside, where she laid in the yard for over 2 hours. She reports bruising and pain of the left shoulder, but normal motion.  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 Her daughter is concerned about her having additional therapy due to her current performance status.  She feels the patient's memory is worse. She did not discuss this with Dr. Luana Shu at their visit with him last month.  REVIEW OF SYSTEMS:  Review of Systems  Constitutional: Positive for fatigue. Negative for appetite change, chills, fever and unexpected weight change.  HENT:   Negative for lump/mass, mouth sores and sore throat.   Respiratory: Negative for cough and shortness of breath.   Cardiovascular: Negative for chest pain and leg swelling.  Gastrointestinal: Negative for abdominal pain, constipation, diarrhea, nausea and vomiting.  Endocrine: Negative for hot flashes.  Genitourinary: Negative for difficulty urinating, dysuria, frequency and hematuria.  Musculoskeletal: Positive for arthralgias (Left shoulder pain after fall). Negative for back pain and myalgias.   Skin: Negative for rash.  Neurological: Positive for numbness (Bilateral feet). Negative for dizziness and headaches.  Hematological: Negative for adenopathy. Does not bruise/bleed easily.  Psychiatric/Behavioral: Negative for depression and sleep disturbance. The patient is not nervous/anxious.      VITALS:  There were no vitals taken for this visit.  Wt Readings from Last 3 Encounters:  05/21/20 148 lb 9.6 oz (67.4 kg)  04/01/20 148 lb 14.4 oz (67.5 kg)  03/26/20 147 lb 12 oz (67 kg)    There is no height or weight on file to calculate BMI.  Performance status (ECOG): 2 - Symptomatic, <50% confined to bed  PHYSICAL EXAM:  Physical Exam Vitals and nursing note reviewed.  Constitutional:      General: She is not in acute distress.    Appearance: Normal appearance.  HENT:     Head: Normocephalic and atraumatic.     Mouth/Throat:     Mouth: Mucous membranes are moist.     Pharynx: Oropharynx is clear. No oropharyngeal exudate or posterior oropharyngeal erythema.  Eyes:     General: No scleral icterus.    Extraocular Movements: Extraocular movements intact.     Conjunctiva/sclera: Conjunctivae normal.     Pupils: Pupils are equal, round, and reactive to light.  Cardiovascular:     Rate and Rhythm: Normal rate and regular rhythm.     Heart sounds: Normal heart sounds. No murmur heard. No friction rub. No gallop.   Pulmonary:     Effort: Pulmonary effort is normal.     Breath sounds: Normal breath sounds. No wheezing, rhonchi or rales.  Chest:  Breasts:     Right: No axillary adenopathy or supraclavicular adenopathy.     Left: No axillary adenopathy or supraclavicular adenopathy.      Comments:   Breast exam is deferred Abdominal:     General: There is no distension.     Palpations: Abdomen is soft. There is no hepatomegaly, splenomegaly or mass.     Tenderness: There is no abdominal tenderness.  Musculoskeletal:        General: Tenderness (Mild tenderness of the head  of the left humerus) present. Normal range of motion.     Left shoulder: Bony tenderness present. No swelling, deformity, effusion, laceration, tenderness or crepitus. Normal range of motion. Normal strength. Normal pulse.     Cervical back: Normal range of motion and neck supple. No tenderness.     Right lower leg: No edema.     Left lower leg: No edema.  Lymphadenopathy:     Cervical: No cervical adenopathy.     Upper Body:     Right upper body: No supraclavicular or axillary adenopathy.     Left upper body: No supraclavicular or axillary adenopathy.     Lower Body: No right inguinal adenopathy. No left inguinal adenopathy.  Skin:    General: Skin is warm and dry.     Coloration: Skin is not jaundiced.     Findings: Bruising (Resolving bruising over the head of the left humerus) present. No rash.  Neurological:     Mental Status: She is alert and oriented to person, place, and time.     Cranial Nerves: No cranial nerve deficit.     Motor: No weakness.     Coordination: Finger-Nose-Finger Test normal. Rapid alternating movements normal.  Psychiatric:        Mood and Affect: Mood  normal.        Behavior: Behavior normal.        Thought Content: Thought content normal.    LABS:   CBC Latest Ref Rng & Units 05/29/2020 04/01/2020 03/24/2020  WBC - 5.8 2.8 4.5  Hemoglobin 12.0 - 16.0 12.2 10.9(A) 10.9(A)  Hematocrit 36 - 46 37 33(A) 33(A)  Platelets 150 - 399 193 221 209   CMP Latest Ref Rng & Units 05/29/2020 04/01/2020 03/24/2020  BUN 4 - '21 21 13 14  ' Creatinine 0.5 - 1.1 0.7 0.7 0.7  Sodium 137 - 147 141 138 136(A)  Potassium 3.4 - 5.3 4.1 3.4 3.7  Chloride 99 - 108 104 102 102  CO2 13 - 22 30(A) 30(A) 28(A)  Calcium 8.7 - 10.7 10.7 9.4 9.2  Total Protein 6.3 - 8.2 g/dL - - -  Alkaline Phos 25 - 125 73 75 71  AST 13 - 35 '24 24 25  ' ALT 7 - 35 '20 20 31     ' No results found for: CEA1 / No results found for: CEA1 No results found for: PSA1 No results found for: XGZ358 No results  found for: IPP898  No results found for: TOTALPROTELP, ALBUMINELP, A1GS, A2GS, BETS, BETA2SER, GAMS, MSPIKE, SPEI No results found for: TIBC, FERRITIN, IRONPCTSAT No results found for: LDH  STUDIES:  No results found.    HISTORY:   Past Medical History:  Diagnosis Date  . Arthritis    osteoarthritis  . Breast cancer ([place])   . Diabetes mellitus without complication ([place])    not on medication  . History of CVA (cerebrovascular accident)   . Hypertension   . Multiple thyroid nodules    benign    Past Surgical History:  Procedure Laterality Date  . BIOPSY THYROID     benign  . BREAST BIOPSY      Family History  Problem Relation Age of Onset  . Colon cancer Father        24s  . Breast cancer Sister        34-60  . Colon cancer Brother        8s  . Prostate cancer Brother        late 47s  . Breast cancer Half-Sister        late 53s early 24s    Social History:  reports that she has never smoked. She has never used smokeless tobacco. She reports previous alcohol use. She reports that she does not use drugs.The patient is accompanied by her daughter today.  Allergies: No Known Allergies  Current Medications: Current Outpatient Medications  Medication Sig Dispense Refill  . acetaminophen (TYLENOL) 500 MG tablet Take 500 mg by mouth at bedtime.    Marland Kitchen aspirin EC 81 MG tablet Take 81 mg by mouth daily. Swallow whole.    Marland Kitchen atorvastatin (LIPITOR) 40 MG tablet Take 40 mg by mouth daily.    Marland Kitchen dexamethasone (DECADRON) 4 MG tablet Take 1 tablet (4 mg total) by mouth as directed. 3 pills night before and 3 pills morning of first chemo, then 1 pill night before and morning of weekly chemo 30 tablet 2  . diphenhydrAMINE (BENADRYL) 25 mg capsule Take 25 mg by mouth at bedtime as needed. At night as needed for itching    . Loperamide HCl (IMODIUM PO) Take by mouth. Over the counter imodium, patient is taking    . losartan (COZAAR) 50 MG tablet Take 50 mg by mouth daily.    Marland Kitchen  omeprazole (PRILOSEC) 40 MG capsule Take 1 capsule (40 mg total) by mouth daily. 30 capsule 3  . ondansetron (ZOFRAN) 4 MG tablet Take 1 tablet (4 mg total) by mouth every 4 (four) hours as needed for nausea or vomiting. 30 tablet 5  . oxyCODONE (OXY IR/ROXICODONE) 5 MG immediate release tablet SMARTSIG:1 Tablet(s) By Mouth 4-5 Times Daily    . predniSONE (DELTASONE) 5 MG tablet Take 2.5 mg by mouth 2 (two) times a week. TAKES 1/2 TABLET OF 5MG TWICE A WEEK    . prochlorperazine (COMPAZINE) 10 MG tablet Take 1 tablet (10 mg total) by mouth every 6 (six) hours as needed for nausea or vomiting. 30 tablet 5  . Vitamin D3 (VITAMIN D) 25 MCG tablet Take 1,000 Units by mouth daily.     No current facility-administered medications for this visit.    ASSESSMENT & PLAN:   Assessment/Plan:  SERI KIMMER is a 87 y.o. female  clinical stage IB hormone and HER2 receptor positive.  She received neoadjuvant trastuzumab/pertuzumab/ paclitaxel and has now had a lumpectomy .  She had an excellent response to neoadjuvant trastuzumab/pertuzumab/paclitaxel with just a tiny residual invasive carcinoma, but due to this, adjuvant HER2 targeted therapy with Kadcyla as recommended. She remains weak at this time and as she had that recent fall, I feel we should delay starting therapy.  She is still hesitant about continuing any therapy.  We discussed the possible side effects of Kadcyla and gave her some written information. She needs a repeat echocardiogram prior to starting.  Also, we need to get her bone density scan done, so we can decide on adjuvant hormonal therapy.  I will plan to see her back in 2 weeks with a CBC and comprehensive metabolic panel prior to initiating adjuvant Kadcyla. The patient and her daughter understand the plans discussed today and are in agreement with them.  They know to contact our office if she develops issues prior to her next appointment.    Marvia Pickles, PA-C

## 2020-06-02 ENCOUNTER — Telehealth: Payer: Self-pay | Admitting: Hematology and Oncology

## 2020-06-02 ENCOUNTER — Encounter: Payer: Self-pay | Admitting: Hematology and Oncology

## 2020-06-02 NOTE — Progress Notes (Unsigned)
Per [employer] web-page: No PA required for [id number] ECHO

## 2020-06-02 NOTE — Telephone Encounter (Signed)
Patient scheduled for ECHO 5/11 at 11:00 am - [facility] Bone Density 5/18 at 3:00 pm - [place] patient/Left Message with Daughter to call back to give Appt's/Instructions

## 2020-06-02 NOTE — Telephone Encounter (Signed)
Per 5/9 LOS, patient scheduled for 5/18 Bone Density at 8:30 am - ECHO at 9:00 am..  Patient's caregiver notified given Appt Dates/Instructions.  Mailed Orders to patient with instructions

## 2020-06-05 ENCOUNTER — Telehealth: Payer: Self-pay

## 2020-06-05 NOTE — Telephone Encounter (Addendum)
Pt did in fact, go to ED as encouraged. Kelli,PA, notified.  ----- Message from Marvia Pickles, PA-C sent at 06/05/2020  3:19 PM EDT ----- Regarding: RE: Pt fell last week, feeling worse today Contact: [phone] Yes, thanks, I guess I am more worried about PE. She had some pain, but not sig SOB when I saw her Monday. ----- Message ----- From: Dairl Ponder, RN Sent: 06/05/2020   2:25 PM EDT To: Marvia Pickles, PA-C Subject: Pt fell last week, feeling worse today         Pt's dtr, Brandy Jacobs, called to report that her mom is feeling worse this morning.She is having increased SOB & she wonders if she broke a rib or something. Her PCP is out of office due to having surgery himself. I told her the best option would be to go to emergency room for evaluation.

## 2020-06-10 DIAGNOSIS — M810 Age-related osteoporosis without current pathological fracture: Secondary | ICD-10-CM | POA: Insufficient documentation

## 2020-06-10 DIAGNOSIS — I351 Nonrheumatic aortic (valve) insufficiency: Secondary | ICD-10-CM

## 2020-06-11 ENCOUNTER — Other Ambulatory Visit: Payer: Medicare Other

## 2020-06-11 ENCOUNTER — Ambulatory Visit: Payer: Medicare Other | Admitting: Hematology and Oncology

## 2020-06-12 ENCOUNTER — Encounter: Payer: Self-pay | Admitting: Hematology and Oncology

## 2020-06-15 ENCOUNTER — Inpatient Hospital Stay (INDEPENDENT_AMBULATORY_CARE_PROVIDER_SITE_OTHER): Payer: Medicare Other | Admitting: Hematology and Oncology

## 2020-06-15 ENCOUNTER — Inpatient Hospital Stay: Payer: Medicare Other | Admitting: Hematology and Oncology

## 2020-06-15 ENCOUNTER — Inpatient Hospital Stay: Payer: Medicare Other

## 2020-06-15 ENCOUNTER — Encounter: Payer: Self-pay | Admitting: Hematology and Oncology

## 2020-06-15 ENCOUNTER — Other Ambulatory Visit: Payer: Self-pay

## 2020-06-15 VITALS — BP 137/64 | HR 67 | Temp 98.4°F | Resp 18 | Ht 63.5 in | Wt 145.2 lb

## 2020-06-15 DIAGNOSIS — Z170 Estrogen receptor positive status [ER+]: Secondary | ICD-10-CM | POA: Diagnosis not present

## 2020-06-15 DIAGNOSIS — C50411 Malignant neoplasm of upper-outer quadrant of right female breast: Secondary | ICD-10-CM | POA: Diagnosis not present

## 2020-06-15 LAB — COMPREHENSIVE METABOLIC PANEL
Albumin: 4.1 (ref 3.5–5.0)
Calcium: 10.2 (ref 8.7–10.7)

## 2020-06-15 LAB — HEPATIC FUNCTION PANEL
ALT: 19 (ref 7–35)
AST: 25 (ref 13–35)
Alkaline Phosphatase: 84 (ref 25–125)
Bilirubin, Total: 1.3

## 2020-06-15 LAB — CBC AND DIFFERENTIAL
HCT: 37 (ref 36–46)
Hemoglobin: 11.8 — AB (ref 12.0–16.0)
Neutrophils Absolute: 4.29
Platelets: 199 (ref 150–399)
WBC: 6.6

## 2020-06-15 LAB — BASIC METABOLIC PANEL
BUN: 20 (ref 4–21)
CO2: 28 — AB (ref 13–22)
Chloride: 104 (ref 99–108)
Creatinine: 0.7 (ref 0.5–1.1)
Glucose: 143
Potassium: 4.3 (ref 3.4–5.3)
Sodium: 137 (ref 137–147)

## 2020-06-15 LAB — CBC: RBC: 3.95 (ref 3.87–5.11)

## 2020-06-15 NOTE — Progress Notes (Signed)
[facility]  [address] [place],  [place]  [address] [phone]  Clinic Day:  06/15/2020  Referring physician: Guadalupe Maple, MD   CHIEF COMPLAINT:  CC:  Clinical stage IB hormone and HER2 receptor positive breast cancer status post lumpectomy  Current Treatment:   Evaluate for adjuvant HER2 targeted therapy  HISTORY OF PRESENT ILLNESS:  Brandy Jacobs is a 87 y.o. 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 She presented for annual bilateral mammogram and had a palpable upper right breast mass, so underwent diagnostic bilateral mammogram and ultrasound.  There was a 2.9 cm upper right breast mass highly suspicious for malignancy.  No abnormal right axillary lymph nodes were seen in left breast did not reveal any evidence of malignancy. 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 Estrogen and progesterone receptors were positive and HER2 positive.  Ki67 was 40%.  Due to the HER2 positivity, we recommended neoadjuvant therapy with trastuzumab, pertuzumab and weekly paclitaxel weekly for 3 cycles. She received her first cycle in January. She underwent an additional stereotactic biopsy of an area in the inferior right breast later in January to guide surgery.  Pathology revealed atypical ductal hyperplasia.  She had difficulty tolerating therapy, mainly due to diarrhea, causing dehydration and hypokalemia, but she also had cytopenias. She completed neoadjuvant therapy on March 3rd.  We skipped 2 doses of weekly paclitaxel due to toxicities. She was then referred back to Dr. Lilia Pro for definitive surgery.  She underwent lumpectomy on April 11th.  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  Estrogen and progesterone  receptors were positive and HER2 positive.  Ki-67 was less than 5%.  At her visit at Dr. Deborra Medina office, she was mildly anemic postoperatively.  I had seen her on April 28th to continue adjuvant HER2 targeted therapy with trastuzumab/pertuzumab, but after discussion with Dr. Bobby Rumpf, as she did have a small residual cancer, adjuvant HER2 targeted therapy with Kadcyla (ado-trastuzumab emtansine) is recommended instead, for a total of a year of HER2 targeted therapy.  When I saw her 2 weeks ago, she remained fatigued and had had a fall where she laid in the yard for over 2 hours. She bruising and pain of the left shoulder, but normal motion.  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  She has had difficulty with memory, as well as expression since her stroke.  A repeat echocardiogram was ordered prior to starting Kadcyla.  A bone density scan was ordered prior to starting adjuvant hormonal therapy.  She had a persistently elevated bilirubin, so testing for UGT1A1 was ordered.  INTERVAL HISTORY:  Brandy Jacobs is here today for repeat clinical assessment prior to starting adjuvant Kadcyla. She was seen in the emergency department on May 13th due to dyspnea and chest pain since her fall. CTA chest did not reveal any evidence of pulmonary embolism or other acute cardiopulmonary abnormality. A healed fracture of the left 4th rib was seen.  Left shoulder x-ray revealed osteopenia but no acute fracture or dislocation of the shoulder.  Mild degenerative changes were seen.  A mildly displaced fracture of the left 5th rib was noted. As she already had oxycodone to use as needed, no additional recommendations were made.  Generally, she has improved.  She states she felt mildly weak and had nausea this morning, which resolved without medications. Her rib and shoulder pain have improved.  She denies continued increased dyspnea or chest pain. Her appetite has improved.  Her weight has been  stable.  She has had an echocardiogram and bone density scan.   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  She takes Tylenol for arthritis pain, but has also taken ibuprofen without difficulty.  I advised her to avoid Tylenol if possible and use ibuprofen as needed, but to take it with food.  She does not drink alcohol. She saw Dr. Lilia Pro last week and had a breast exam.  REVIEW OF SYSTEMS:  Review of Systems  Constitutional: Negative for appetite change, chills, fatigue, fever and unexpected weight change.  HENT:   Negative for lump/mass, mouth sores and sore throat.   Respiratory: Negative for cough and shortness of breath.   Cardiovascular: Negative for chest pain and leg swelling.  Gastrointestinal: Negative for abdominal pain, constipation, diarrhea, nausea and vomiting.  Endocrine: Negative for hot flashes.  Genitourinary: Negative for difficulty urinating, dysuria, frequency and hematuria.   Musculoskeletal: Negative for arthralgias, back pain and myalgias.  Skin: Negative for rash.  Neurological: Negative for dizziness and headaches.  Hematological: Negative for adenopathy. Does not bruise/bleed easily.  Psychiatric/Behavioral: Negative for depression and sleep disturbance. The patient is not nervous/anxious.      VITALS:  Blood pressure 137/64, pulse 67, temperature 98.4 F (36.9 C), temperature source Oral, resp. rate 18, height 5' 3.5" (1.613 m), weight 145 lb 3.2 oz (65.9 kg), SpO2 96 %.  Wt Readings from Last 3 Encounters:  06/15/20 145 lb 3.2 oz (65.9 kg)  06/01/20 145 lb 12.8 oz (66.1 kg)  05/21/20 148 lb 9.6 oz (67.4 kg)    Body mass index is 25.32 kg/m.  Performance status (ECOG): 2 - Symptomatic, <50% confined to bed  PHYSICAL EXAM:  Physical Exam Vitals and nursing note reviewed.  Constitutional:      General: She is not in acute distress.    Appearance: Normal  appearance.  HENT:     Head: Normocephalic and atraumatic.     Mouth/Throat:     Mouth: Mucous membranes are moist.     Pharynx: Oropharynx is clear. No oropharyngeal exudate or posterior oropharyngeal erythema.  Eyes:     General: No scleral icterus.    Extraocular Movements: Extraocular movements intact.     Conjunctiva/sclera: Conjunctivae normal.     Pupils: Pupils are equal, round, and reactive to light.  Cardiovascular:     Rate and Rhythm: Normal rate and regular rhythm.     Heart sounds: Normal heart sounds. No murmur heard. No friction rub. No gallop.   Pulmonary:     Effort: Pulmonary effort is normal.     Breath sounds: Normal breath sounds. No wheezing, rhonchi or rales.  Chest:  Breasts:     Right: No axillary adenopathy or supraclavicular adenopathy.     Left: No axillary adenopathy or supraclavicular adenopathy.      Comments:   Breast exam is deferred Abdominal:     General: There is no distension.     Palpations: Abdomen is soft. There is no hepatomegaly, splenomegaly or mass.     Tenderness: There is no abdominal tenderness.  Musculoskeletal:        General: Normal range of motion.     Cervical back:  Normal range of motion and neck supple. No tenderness.     Right lower leg: No edema.     Left lower leg: No edema.  Lymphadenopathy:     Cervical: No cervical adenopathy.     Upper Body:     Right upper body: No supraclavicular or axillary adenopathy.     Left upper body: No supraclavicular or axillary adenopathy.     Lower Body: No right inguinal adenopathy. No left inguinal adenopathy.  Skin:    General: Skin is warm and dry.     Coloration: Skin is not jaundiced.     Findings: No rash.  Neurological:     Mental Status: She is alert and oriented to person, place, and time.     Cranial Nerves: No cranial nerve deficit.  Psychiatric:        Mood and Affect: Mood normal.        Behavior: Behavior normal.        Thought Content: Thought content normal.     LABS:   CBC Latest Ref Rng & Units 06/15/2020 05/29/2020 04/01/2020  WBC - 6.6 5.8 2.8  Hemoglobin 12.0 - 16.0 11.8(A) 12.2 10.9(A)  Hematocrit 36 - 46 37 37 33(A)  Platelets 150 - 399 199 193 221   CMP Latest Ref Rng & Units 06/15/2020 05/29/2020 04/01/2020  BUN 4 - _0 Creatinine 0.5 - 1.1 0.7 0.7 0.7  Sodium 137 - 147 137 141 138  Potassium 3.4 - 5.3 4.3 4.1 3.4  Chloride 99 - 108 104 104 102  CO2 13 - 22 28(A) 30(A) 30(A)  Calcium 8.7 - 10.7 10.2 10.7 9.4  Total Protein 6.3 - 8.2 g/dL - - -  Alkaline Phos 25 - 125 84 73 75  AST 13 - 35 _1 ALT 7 - 35 _2 No results found for: CEA1 / No results found for: CEA1 No results found for: PSA1 No results found for: GEX528 No results found for: UXL244  No results found for: TOTALPROTELP, ALBUMINELP, A1GS, A2GS, BETS, BETA2SER, GAMS, MSPIKE, SPEI No results found for: TIBC, FERRITIN, IRONPCTSAT No results found for: LDH  STUDIES:  No results found.  Exam(s): [id number] CT/CT ANGIO CHEST CLINICAL DATA:  Chest pain and shortness of breath. Recent lumpectomy for breast carcinoma  EXAM: CT ANGIOGRAPHY CHEST WITH CONTRAST  TECHNIQUE: Multidetector CT imaging of the chest was performed using the standard protocol during bolus administration of intravenous contrast. Multiplanar CT image reconstructions and MIPs were obtained to evaluate the vascular anatomy.  CONTRAST:  80 mL Isovue 370 nonionic  COMPARISON:  Chest radiograph January 27, 2020  FINDINGS: Cardiovascular: No demonstrable pulmonary embolus. There is no appreciable thoracic aortic aneurysm or dissection. There there are scattered foci of calcification in visualized great vessels. There is aortic atherosclerosis. There are foci of coronary artery calcification. There is no pericardial effusion or pericardial thickening. Port-A-Cath tip in superior vena cava.  Mediastinum/Nodes: No thyroid lesions are evident. No appreciable adenopathy in the  abdomen or pelvis. There is a small hiatal hernia.  Lungs/Pleura: There are scattered areas of scarring bilaterally. There is no appreciable edema or airspace opacity. No pleural effusions. No pneumothorax. Trachea and major bronchial structures appear patent.  Upper Abdomen: In the visualized upper abdomen, there is aortic atherosclerosis. Visualized upper abdominal structures otherwise appear unremarkable.  Musculoskeletal: There is degenerative change in the thoracic spine with increased kyphosis. There is evidence of healed fracture along  the lateral left fourth rib. No blastic or lytic bone lesions. No chest wall lesions. Port noted anteriorly on the left.  Review of the MIP images confirms the above findings.  IMPRESSION: 1. No demonstrable pulmonary embolus. No thoracic aortic aneurysm or dissection. There are foci of aortic atherosclerosis as well as foci of great vessel and coronary artery calcification. Port-A-Cath tip in superior vena cava.  2. No edema or airspace opacity. No pleural effusions. Areas of mild scarring bilaterally. No pneumothorax.  3.  Focal hiatal hernia noted.  4.  No evident adenopathy.  Aortic Atherosclerosis (ICD10-I70.0).    Exam(s): [id number] RAD/DG SHOULDER 2+V-L CLINICAL DATA:  Fall 1 week ago  EXAM: LEFT SHOULDER - 2+ VIEW  COMPARISON:  None.  FINDINGS: Osteopenia. No acute fracture or dislocation of the LEFT shoulder. There is a minimally displaced fracture of the lateral fifth rib. Mild degenerative changes of the acromioclavicular joint. LEFT chest port is partially visualized. Atherosclerotic calcifications. No area of erosion or osseous destruction. No unexpected radiopaque foreign body. Soft tissues are unremarkable.  IMPRESSION: No acute fracture or dislocation of the shoulder. There is a mildly displaced rib fracture of the lateral fifth rib.   Echocardiogram Report-Jun 10, 2020:  INDICATION: CHEMO  HEIGHT:  162.6 cm (5 ft 4.0 in) WEIGHT: 65.8 kg (145.0 lbs) BP: 158/74 BSA:   1.448185 m  MEASUREMENTS ------------ 2D LVOT Diam: 2.1 cm IVSd: 1.0 cm LVIDd: 3.7 cm LVPWd: 1.1 cm LVIDs: 2.6 cm EF(Teich): 57.48 % LA Diam: 2.8 cm Ao Diam: 2.3 cm LAESV MOD A4C: 40.6 ml LAESV MOD A2C: 46.0 ml LAESV Index (A-L): 27.03 ml/m ------------ M-MODE IVSd: 1.0 cm LVIDd: 5.1 cm LVPWd: 1.0 cm LVIDs: 3.5 cm EF(Teich): 59 % Ao Diam: 3.0 cm LA Diam: 3.6 cm AV Cusp: 1.5 cm ------------ DOPPLER MV E Vel: 0.73 m/s MV A Vel: 1.07 m/s MV PHT: 81.52 ms MVA By PHT: 2.70 cm LVOT Vmax: 1.17 m/s AV Vmax: 1.24 m/s AVA Vmax, Pt: 3.11 cm TR Vmax: 2.37 m/s TR maxPG: 24 mmHg RVSP: 26.65 mmHg   FINDINGS ------- Procedure:2D images, m-mode, color and spectral Doppler were obtained and reviewed. ECG rhythm:Sinus rhythm. Study quality:This was a technically adequate study to somewhatTDS.  Per patient has fracture rib(s) on left side.  Unable to position patient properly. Left Ventricle:The left ventricular size is normal.   Left ventricular wall thickness is normal.   There is normal global left ventricular contractility.   Overall left ventricular systolic function is normal with, an EF between 55 - 60 %.   The diastolic filling pattern indicates impaired relaxation.  No regional wall motion weere noted.  GLS -17.5%  Average GLS -17.5%   No regional wall motion abnormalities were noted. Right Ventricle:The right ventricle is normal in size and function. Left Atrium:The left atrium is normal in size. Right Atrium:The right atrium is normal in size and function. ASD/VSD:Interatrial and interventricular septum intact. Aortic Valve:The aortic valve is trileaflet.  There is mild aortic valve sclerosis.   There is mild aortic regurgitation.   There is no evidence of aortic stenosis. Mitral Valve:Normal appearing mitral valve.    Mild mitral annular calcification present.   Mild mitral regurgitation is  present. Tricuspid Valve:The tricuspid valve appears structurally normal.   Mild tricuspid regurgitation present.   There is no evidence of pulmonary hypertension.   The right ventricular systolic pressure, as measured by Doppler, is 78mHg. Pulmonic Valve:The pulmonic valve is normal.   Trace/mild (physiologic)  pulmonic regurgitation. Aorta:The  aortic root and ascending aorta appear normal. YPP:JKDTOI inferior vena cava with normal inspiratory collapse. Pulmonary Veins:The pulmonary veins were not recorded. Pericardium:The pericardium is normal.  CONCLUSIONS ----------- 1. There is normal global left ventricular contractility. 2. Overall left ventricular systolic function is normal with, an EF between 55 - 60 %. 3. Average GLS -17.5% 4. There is mild aortic regurgitation. 5. Mild mitral annular calcification present. 6. Mild mitral regurgitation is present.  Exam(s): [id number] DEXA/DG DEXA EXAM: DUAL X-RAY ABSORPTIOMETRY (DXA) FOR BONE MINERAL DENSITY  IMPRESSION: Keiarra Zarrella completed a BMD test on 06/10/2020 using the Dike (analysis version: 13.60) manufactured by [employer]. The following summarizes the results of our evaluation. Technologist: [facility] PATIENT BIOGRAPHICAL: Name: TRINIDY, MASTERSON Patient ID:  [id number] [place] Birth Date: [birth year] Height:     64.0 in. Gender: Female Exam Date: 06/10/2020 Weight: 145.0 lbs. Indications: Postmenopausal fractures: Treatments:  ASSESSMENT: The BMD measured at Femur Total Left is 0.531 g/cm2 with a T-score of -3.8. This patient is considered osteoporotic according to [facility]) criteria. The scan quality is good. Lumbar spine was not utilized due to degenerative changes. Site Region Measured Measured WHO Young Adult BMD Date      Age      Classification T-score DualFemur Total Left 06/10/2020 85.9 Osteoporosis -3.8 0.531 g/cm2 DualFemur Total Left 03[birth year]20/2002 67.7 Osteopenia -2.1 0.745  g/cm2  DualFemur Total Mean 06/10/2020 85.9 Osteoporosis -3.6 0.554 g/cm2 DualFemur Total Mean [birth year]20/2002 67.7 Osteopenia -2.0 0.757 g/cm2  Left Forearm Radius 33% 06/10/2020 85.9 Osteoporosis -3.6 0.560 g/cm2  World Health Organization [facility]) criteria for post-menopausal, Caucasian Women: Normal:       T-score at or above -1 SD Osteopenia:   T-score between -1 and -2.5 SD Osteoporosis: T-score at or below -2.5 SD  RECOMMENDATIONS: 1. All patients should optimize calcium and vitamin D intake. 2. Consider FDA- approved medical therapies in post menopausal women and men aged 19 years and older, based on the following: a. A hip or vertebral (clinical or morphometric) fracture. b. T- score < 2.5 of the femoral neck or spine after appropriate evaluation to exclude secondary causes. c. Low bone mass (T- score between -1.0 and -2.5 at femoral neck or spine) and a 10 -year probability of a hip fracture > 3% or a 10 -year probability of a major osteoporosis- related fracture > 20% based on the [place]- adapted WHO algorithm. d. Clinician judgement and/ or patient preferences may indicate treatment for people with 10- year fracture probabilities above or below these levels.  FOLLOW-UP: People with diagnosed cases of osteoporosis or at high risk for fracture should have regular bone mineral density tests. For patients eligible for Medicare, routine testing is allowed once every 2 years. The testing frequency can be increased to one year for patients who have rapidly progressing disease, those who are receiving medical therapy to restore bone mass, or have additional risk factors. I have reviewed this report and agree with the above findings.  HISTORY:   Past Medical History:  Diagnosis Date  . Arthritis    osteoarthritis  . Breast cancer ([place])   . Diabetes mellitus without complication ([place])    not on medication  . History of CVA (cerebrovascular accident)   . Hypertension   . Multiple  thyroid nodules    benign    Past Surgical History:  Procedure Laterality Date  . BIOPSY THYROID     benign  . BREAST BIOPSY      Family History  Problem Relation Age of Onset  . Colon cancer Father        68s  . Breast cancer Sister        69-60  . Colon cancer Brother        46s  . Prostate cancer Brother        late 65s  . Breast cancer Half-Sister        late 66s early 98s    Social History:  reports that she has never smoked. She has never used smokeless tobacco. She reports previous alcohol use. She reports that she does not use drugs.The patient is accompanied by her daughter today.  Allergies: No Known Allergies  Current Medications: Current Outpatient Medications  Medication Sig Dispense Refill  . acetaminophen (TYLENOL) 500 MG tablet Take 500 mg by mouth at bedtime.    Marland Kitchen aspirin EC 81 MG tablet Take 81 mg by mouth daily. Swallow whole.    Marland Kitchen atorvastatin (LIPITOR) 40 MG tablet Take 40 mg by mouth daily.    Marland Kitchen dexamethasone (DECADRON) 4 MG tablet Take 1 tablet (4 mg total) by mouth as directed. 3 pills night before and 3 pills morning of first chemo, then 1 pill night before and morning of weekly chemo 30 tablet 2  . diphenhydrAMINE (BENADRYL) 25 mg capsule Take 25 mg by mouth at bedtime as needed. At night as needed for itching    . Loperamide HCl (IMODIUM PO) Take by mouth. Over the counter imodium, patient is taking    . losartan (COZAAR) 50 MG tablet Take 50 mg by mouth daily.    Marland Kitchen omeprazole (PRILOSEC) 40 MG capsule Take 1 capsule (40 mg total) by mouth daily. 30 capsule 3  . ondansetron (ZOFRAN) 4 MG tablet Take 1 tablet (4 mg total) by mouth every 4 (four) hours as needed for nausea or vomiting. 30 tablet 5  . oxyCODONE (OXY IR/ROXICODONE) 5 MG immediate release tablet SMARTSIG:1 Tablet(s) By Mouth 4-5 Times Daily    . predniSONE (DELTASONE) 5 MG tablet Take 2.5 mg by mouth 2 (two) times a week. TAKES 1/2 TABLET OF 5MG TWICE A WEEK    . prochlorperazine  (COMPAZINE) 10 MG tablet Take 1 tablet (10 mg total) by mouth every 6 (six) hours as needed for nausea or vomiting. 30 tablet 5  . Vitamin D3 (VITAMIN D) 25 MCG tablet Take 1,000 Units by mouth daily.     No current facility-administered medications for this visit.    ASSESSMENT & PLAN:   Assessment: 1. Stage IB hormone and HER2 receptor positive breast cancer status post neoadjuvant trastuzumab/pertuzumab/paclitaxel.  She had an excellent response to neoadjuvant trastuzumab/pertuzumab/paclitaxel with just a tiny residual invasive carcinoma. Adjuvant HER2 targeted therapy with Kadcyla for a total of a year of HER2 targeted therapy is recommended .  Her overall condition has improved and she has decided to proceed with Kadcyla. We still need to start adjuvant hormonal therapy, and given her history of stroke, I would recommend aromatase inhibitor. 2. Mildly decreased left ventricular ejection fraction from previous echocardiogram.  We will continue to monitor this while she is on Kadcyla, due to the potential cardiotoxicities from HER2 targeted therapy. 3. Osteoporosis, she is taking vitamin-D, but not calcium.  I will have her start calcium 600 mg twice daily.  At her next visit, we will discuss treatment of osteoporosis, as we need to place her on aromatase inhibitor, which can contribute to worsening bone density.Marland Kitchen 4. 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 I advised her to avoid Tylenol, she does not drink alcohol.  Plan:  She will proceed with her 1st cycle of adjuvant Kadcyla this week.  I will plan to see her back in 10 days with a CBC and comprehensive metabolic panel to see how she tolerated her 1st cycle. The patient and her daughter understand the plans discussed today and are in agreement with them.  They know to contact our office if she develops issues prior to her next appointment.    Marvia Pickles, PA-C

## 2020-06-16 ENCOUNTER — Telehealth: Payer: Self-pay | Admitting: Hematology and Oncology

## 2020-06-16 ENCOUNTER — Other Ambulatory Visit: Payer: Self-pay | Admitting: Oncology

## 2020-06-16 NOTE — Telephone Encounter (Signed)
No 5/23 LOS

## 2020-06-17 ENCOUNTER — Telehealth: Payer: Self-pay | Admitting: Hematology and Oncology

## 2020-06-17 ENCOUNTER — Inpatient Hospital Stay: Payer: Medicare Other

## 2020-06-17 ENCOUNTER — Other Ambulatory Visit: Payer: Self-pay

## 2020-06-17 VITALS — BP 134/71 | HR 69 | Temp 97.8°F | Resp 18 | Ht 63.5 in | Wt 145.0 lb

## 2020-06-17 DIAGNOSIS — C50911 Malignant neoplasm of unspecified site of right female breast: Secondary | ICD-10-CM | POA: Diagnosis present

## 2020-06-17 DIAGNOSIS — C50411 Malignant neoplasm of upper-outer quadrant of right female breast: Secondary | ICD-10-CM

## 2020-06-17 DIAGNOSIS — Z79899 Other long term (current) drug therapy: Secondary | ICD-10-CM | POA: Diagnosis not present

## 2020-06-17 DIAGNOSIS — Z5112 Encounter for antineoplastic immunotherapy: Secondary | ICD-10-CM | POA: Diagnosis not present

## 2020-06-17 DIAGNOSIS — Z170 Estrogen receptor positive status [ER+]: Secondary | ICD-10-CM | POA: Diagnosis not present

## 2020-06-17 MED ORDER — SODIUM CHLORIDE 0.9 % IV SOLN
3.6000 mg/kg | Freq: Once | INTRAVENOUS | Status: AC
  Administered 2020-06-17: 240 mg via INTRAVENOUS
  Filled 2020-06-17: qty 8

## 2020-06-17 MED ORDER — HEPARIN SOD (PORK) LOCK FLUSH 100 UNIT/ML IV SOLN
500.0000 [IU] | Freq: Once | INTRAVENOUS | Status: AC | PRN
  Administered 2020-06-17: 500 [IU]
  Filled 2020-06-17: qty 5

## 2020-06-17 MED ORDER — ACETAMINOPHEN 325 MG PO TABS
650.0000 mg | ORAL_TABLET | Freq: Once | ORAL | Status: AC
  Administered 2020-06-17: 650 mg via ORAL

## 2020-06-17 MED ORDER — DIPHENHYDRAMINE HCL 25 MG PO CAPS
50.0000 mg | ORAL_CAPSULE | Freq: Once | ORAL | Status: AC
  Administered 2020-06-17: 50 mg via ORAL

## 2020-06-17 MED ORDER — DIPHENHYDRAMINE HCL 25 MG PO CAPS
ORAL_CAPSULE | ORAL | Status: AC
  Filled 2020-06-17: qty 2

## 2020-06-17 MED ORDER — SODIUM CHLORIDE 0.9 % IV SOLN
Freq: Once | INTRAVENOUS | Status: AC
  Filled 2020-06-17: qty 250

## 2020-06-17 MED ORDER — ACETAMINOPHEN 325 MG PO TABS
ORAL_TABLET | ORAL | Status: AC
  Filled 2020-06-17: qty 2

## 2020-06-17 NOTE — Progress Notes (Signed)
1115: Teaching completed for ado trastuzumab emtansine, including side effects, when to call the office labs, schedule of each cycle expected. Consent signed.  1436: PT STABLE AT TIME OF DISCHARGE

## 2020-06-17 NOTE — Telephone Encounter (Signed)
Per 5/25 Staff Msg, patient scheduled for June Appt's.  Requesting patient be given Appt Summary at today's Visit

## 2020-06-17 NOTE — Patient Instructions (Signed)
[facility]  Discharge Instructions: Thank you for choosing [facility] to provide your oncology and hematology care.  If you have a lab appointment with the [facility], please go directly to the [facility] and check in at the registration area.   Wear comfortable clothing and clothing appropriate for easy access to any Portacath or PICC line.   We strive to give you quality time with your provider. You may need to reschedule your appointment if you arrive late (15 or more minutes).  Arriving late affects you and other patients whose appointments are after yours.  Also, if you miss three or more appointments without notifying the office, you may be dismissed from the clinic at the provider's discretion.      For prescription refill requests, have your pharmacy contact our office and allow 72 hours for refills to be completed.    Today you received the following chemotherapy and/or immunotherapy agents ado Trastuzumab Emtansine     To help prevent nausea and vomiting after your treatment, we encourage you to take your nausea medication as directed.  BELOW ARE SYMPTOMS THAT SHOULD BE REPORTED IMMEDIATELY: . *FEVER GREATER THAN 100.4 F (38 C) OR HIGHER . *CHILLS OR SWEATING . *NAUSEA AND VOMITING THAT IS NOT CONTROLLED WITH YOUR NAUSEA MEDICATION . *UNUSUAL SHORTNESS OF BREATH . *UNUSUAL BRUISING OR BLEEDING . *URINARY PROBLEMS (pain or burning when urinating, or frequent urination) . *BOWEL PROBLEMS (unusual diarrhea, constipation, pain near the anus) . TENDERNESS IN MOUTH AND THROAT WITH OR WITHOUT PRESENCE OF ULCERS (sore throat, sores in mouth, or a toothache) . UNUSUAL RASH, SWELLING OR PAIN  . UNUSUAL VAGINAL DISCHARGE OR ITCHING   Items with * indicate a potential emergency and should be followed up as soon as possible or go to the Emergency Department if any problems should occur.  Please show the CHEMOTHERAPY ALERT CARD or IMMUNOTHERAPY  ALERT CARD at check-in to the Emergency Department and triage nurse.  Should you have questions after your visit or need to cancel or reschedule your appointment, please contact [facility]  Dept: [phone]  and follow the prompts.  Office hours are 8:00 a.m. to 4:30 p.m. Monday - Friday. Please note that voicemails left after 4:00 p.m. may not be returned until the following business day.  We are closed weekends and major holidays. You have access to a nurse at all times for urgent questions. Please call the main number to the clinic Dept: [phone] and follow the prompts.  For any non-urgent questions, you may also contact your provider using MyChart. We now offer e-Visits for anyone 9 and older to request care online for non-urgent symptoms. For details visit [email or web address].   Also download the MyChart app! Go to the app store, search "MyChart", open the app, select [facility], and log in with your MyChart username and password.  Due to Covid, a mask is required upon entering the hospital/clinic. If you do not have a mask, one will be given to you upon arrival. For doctor visits, patients may have 1 support person aged 63 or older with them. For treatment visits, patients cannot have anyone with them due to current Covid guidelines and our immunocompromised population.

## 2020-06-18 ENCOUNTER — Encounter: Payer: Self-pay | Admitting: Oncology

## 2020-06-19 ENCOUNTER — Telehealth: Payer: Self-pay

## 2020-06-19 NOTE — Telephone Encounter (Signed)
I spoke with pt's dtr, [place]. She states that Ms Dhami only had a little nausea the day after treatment, but subsided with anti- emetic. No emesis. No fevers. No skin reactions. No diarrhea. I reminded Marcelline Mates of the importance of calling us if her mom develops temp of 100.4 or higher, DAY OR NIGHT. She verbalized understanding.

## 2020-06-23 ENCOUNTER — Ambulatory Visit: Payer: Medicare Other

## 2020-06-26 ENCOUNTER — Inpatient Hospital Stay: Payer: Medicare Other | Attending: Oncology

## 2020-06-26 ENCOUNTER — Encounter: Payer: Self-pay | Admitting: Hematology and Oncology

## 2020-06-26 ENCOUNTER — Other Ambulatory Visit: Payer: Self-pay

## 2020-06-26 ENCOUNTER — Inpatient Hospital Stay (INDEPENDENT_AMBULATORY_CARE_PROVIDER_SITE_OTHER): Payer: Medicare Other | Admitting: Hematology and Oncology

## 2020-06-26 ENCOUNTER — Telehealth: Payer: Self-pay | Admitting: Hematology and Oncology

## 2020-06-26 VITALS — BP 150/67 | HR 75 | Temp 98.4°F | Resp 18 | Ht 63.5 in | Wt 144.9 lb

## 2020-06-26 DIAGNOSIS — M810 Age-related osteoporosis without current pathological fracture: Secondary | ICD-10-CM | POA: Insufficient documentation

## 2020-06-26 DIAGNOSIS — C50011 Malignant neoplasm of nipple and areola, right female breast: Secondary | ICD-10-CM

## 2020-06-26 DIAGNOSIS — Z170 Estrogen receptor positive status [ER+]: Secondary | ICD-10-CM | POA: Diagnosis not present

## 2020-06-26 DIAGNOSIS — Z79899 Other long term (current) drug therapy: Secondary | ICD-10-CM | POA: Insufficient documentation

## 2020-06-26 DIAGNOSIS — G629 Polyneuropathy, unspecified: Secondary | ICD-10-CM | POA: Insufficient documentation

## 2020-06-26 DIAGNOSIS — C50411 Malignant neoplasm of upper-outer quadrant of right female breast: Secondary | ICD-10-CM

## 2020-06-26 DIAGNOSIS — C50911 Malignant neoplasm of unspecified site of right female breast: Secondary | ICD-10-CM | POA: Insufficient documentation

## 2020-06-26 DIAGNOSIS — Z5112 Encounter for antineoplastic immunotherapy: Secondary | ICD-10-CM | POA: Insufficient documentation

## 2020-06-26 LAB — COMPREHENSIVE METABOLIC PANEL
Albumin: 4 (ref 3.5–5.0)
Calcium: 10.4 (ref 8.7–10.7)

## 2020-06-26 LAB — BASIC METABOLIC PANEL
BUN: 18 (ref 4–21)
CO2: 27 — AB (ref 13–22)
Chloride: 104 (ref 99–108)
Creatinine: 0.8 (ref 0.5–1.1)
Glucose: 109
Potassium: 3.9 (ref 3.4–5.3)
Sodium: 139 (ref 137–147)

## 2020-06-26 LAB — CBC AND DIFFERENTIAL
HCT: 35 — AB (ref 36–46)
Hemoglobin: 12 (ref 12.0–16.0)
Neutrophils Absolute: 3.72
Platelets: 151 (ref 150–399)
WBC: 6

## 2020-06-26 LAB — HEPATIC FUNCTION PANEL
ALT: 19 (ref 7–35)
AST: 35 (ref 13–35)
Alkaline Phosphatase: 97 (ref 25–125)
Bilirubin, Total: 1.6

## 2020-06-26 LAB — CBC: RBC: 3.89 (ref 3.87–5.11)

## 2020-06-26 NOTE — Telephone Encounter (Signed)
Per 6/3 LOS, patient scheduled for 6/13 Appt's.  Gave patient Appt Summary

## 2020-06-26 NOTE — Progress Notes (Signed)
[name]  [address] [place],  [place]  [address] [phone]  Clinic Day:  06/26/2020  Referring physician: Guadalupe Maple, MD   CHIEF COMPLAINT:  CC:  Clinical stage IB hormone and HER2 receptor positive breast cancer status post lumpectomy  Current Treatment:   Evaluate for adjuvant HER2 targeted therapy  HISTORY OF PRESENT ILLNESS:  Brandy Jacobs is a 87 y.o. 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 She presented for annual bilateral mammogram and had a palpable upper right breast mass, so underwent diagnostic bilateral mammogram and ultrasound.  There was a 2.9 cm upper right breast mass highly suspicious for malignancy.  No abnormal right axillary lymph nodes were seen in left breast did not reveal any evidence of malignancy. 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 Estrogen and progesterone receptors were positive and HER2 positive.  Ki67 was 40%.  Due to the HER2 positivity, we recommended neoadjuvant therapy with trastuzumab, pertuzumab and weekly paclitaxel weekly for 3 cycles. Echocardiogram in December revealed overall normal left ventricular size and function with an ejection fraction of 60 to 65%. She received her first cycle of neoadjuvant trastuzumab/pertuzumab /paclitaxel in January. She underwent an additional stereotactic biopsy of an area in the inferior right breast later in January to guide surgery.  Pathology revealed atypical ductal hyperplasia.  She had difficulty tolerating therapy, mainly due to diarrhea, causing dehydration and hypokalemia, but she also had cytopenias. She completed neoadjuvant therapy in March.  We skipped 2 doses of weekly paclitaxel due to toxicities.   She was then referred back to Dr. Lilia Pro for definitive surgery.  She underwent lumpectomy on April 11th.   Pathology revealed a residual 2 mm, grade 2, invasive ductal carcinoma with foci of ductal carcinoma in situ and atypical lobular hyperplasia.  Estrogen and progesterone receptors were positive and HER2 positive.  Ki-67 was less than 5%.  At her visit at Dr. Deborra Medina office, she was mildly anemic postoperatively. As she did have a small residual cancer, adjuvant HER2 targeted therapy with Kadcyla (ado-trastuzumab emtansine) is recommended instead, for a total of a year of HER2 targeted therapy.  She is also recommended for adjuvant hormonal therapy for 5 years.  She had had a fall in May where she laid in the yard for over 2 hours. She bruising and pain of the left shoulder, but normal motion.  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  She has had difficulty with memory, as well as expression since her stroke, which her daughter feels may have increased since chemotherapy in surgery.  She had a persistently elevated bilirubin, so testing for UGT1A1 was ordered. She was seen in the emergency department on May 13th due to dyspnea and chest pain since her fall. CTA chest did not reveal any evidence of pulmonary embolism or other acute cardiopulmonary abnormality. A healed fracture of the left 4th rib was seen.  Left shoulder x-ray revealed osteopenia but no acute fracture or dislocation of the shoulder.  Mild degenerative changes were seen.  A mildly displaced fracture of the left 5th rib was noted. As she already had oxycodone to use as needed, no additional recommendations were made.   Echocardiogram on May 18th revealed normal left ventricular size and function with an ejection fraction between 55 to 60%.  Bone density scan revealed osteoporosis with  a T-score of -3.8 in the femur, previously -2 and a T-score of -3.6 in the forearm.  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   She takes Tylenol for arthritis pain, but has also taken ibuprofen without difficulty.  I advised her to avoid Tylenol if possible and use ibuprofen as needed, but to take it with food. We wanted to take a stepwise approach to her treatment, so the started her on Kadcyla with plans to address her adjuvant hormonal therapy, as well as treatment of her osteoporosis after we saw how she tolerated Kadcyla.  She received her 1st cycle of Kadcyla on May 25th.  INTERVAL HISTORY:  Brandy Jacobs is here today for repeat clinical assessment to see how she tolerated her 1st cycle of Kadcyla. She states she did not have any difficulty with Kadcyla.  She is still mildly weak.  She still has issues with her memory , which appear stable. She denies fevers or chills.  She denies pain. Her appetite is improved.  Her weight has been stable.  REVIEW OF SYSTEMS:  Review of Systems  Constitutional: Negative for appetite change, chills, fatigue, fever and unexpected weight change.  HENT:   Negative for lump/mass, mouth sores and sore throat.   Respiratory: Negative for cough and shortness of breath.   Cardiovascular: Negative for chest pain and leg swelling.  Gastrointestinal: Negative for abdominal pain, constipation, diarrhea, nausea and vomiting.  Endocrine: Negative for hot flashes.  Genitourinary: Negative for difficulty urinating, dysuria, frequency and hematuria.   Musculoskeletal: Negative for arthralgias, back pain and myalgias.  Skin: Negative for rash.  Neurological: Negative for dizziness and headaches.  Hematological: Negative for adenopathy. Does not bruise/bleed easily.  Psychiatric/Behavioral: Negative for depression and sleep disturbance. The patient is not nervous/anxious.    VITALS:  Blood pressure (!) 150/67, pulse 75, temperature 98.4 F (36.9 C), temperature source Oral, resp. rate 18, height 5' 3.5" (1.613 m), weight 144 lb 14.4 oz (65.7 kg), SpO2 95 %.  Wt Readings from Last 3 Encounters:  06/26/20  144 lb 14.4 oz (65.7 kg)  06/17/20 145 lb (65.8 kg)  06/15/20 145 lb 3.2 oz (65.9 kg)    Body mass index is 25.27 kg/m.  Performance status (ECOG): 1 - Symptomatic but completely ambulatory  PHYSICAL EXAM:  Physical Exam Vitals and nursing note reviewed.  Constitutional:      General: She is not in acute distress.    Appearance: Normal appearance.  HENT:     Head: Normocephalic and atraumatic.     Mouth/Throat:     Mouth: Mucous membranes are moist.     Pharynx: Oropharynx is clear. No oropharyngeal exudate or posterior oropharyngeal erythema.  Eyes:     General: No scleral icterus.    Extraocular Movements: Extraocular movements intact.     Conjunctiva/sclera: Conjunctivae normal.     Pupils: Pupils are equal, round, and reactive to light.  Cardiovascular:     Rate and Rhythm: Normal rate and regular rhythm.     Heart sounds: Normal heart sounds. No murmur heard. No friction rub. No gallop.   Pulmonary:     Effort: Pulmonary effort is normal.     Breath sounds: Normal breath sounds. No wheezing, rhonchi or rales.  Chest:  Breasts:     Right: No axillary adenopathy or supraclavicular adenopathy.     Left: No axillary adenopathy or supraclavicular adenopathy.  Comments:   Breast exam is deferred Abdominal:     General: There is no distension.     Palpations: Abdomen is soft. There is no hepatomegaly, splenomegaly or mass.     Tenderness: There is no abdominal tenderness.  Musculoskeletal:        General: Normal range of motion.     Cervical back: Normal range of motion and neck supple. No tenderness.     Right lower leg: No edema.     Left lower leg: No edema.  Lymphadenopathy:     Cervical: No cervical adenopathy.     Upper Body:     Right upper body: No supraclavicular or axillary adenopathy.     Left upper body: No supraclavicular or axillary adenopathy.     Lower Body: No right inguinal adenopathy. No left inguinal adenopathy.  Skin:    General: Skin is  warm and dry.     Coloration: Skin is not jaundiced.     Findings: No rash.  Neurological:     Mental Status: She is alert and oriented to person, place, and time.     Cranial Nerves: No cranial nerve deficit.  Psychiatric:        Mood and Affect: Mood normal.        Behavior: Behavior normal.        Thought Content: Thought content normal.    LABS:   CBC Latest Ref Rng & Units 06/26/2020 06/15/2020 05/29/2020  WBC - 6.0 6.6 5.8  Hemoglobin 12.0 - 16.0 12.0 11.8(A) 12.2  Hematocrit 36 - 46 35(A) 37 37  Platelets 150 - 399 151 199 193   CMP Latest Ref Rng & Units 06/26/2020 06/15/2020 05/29/2020  BUN 4 - '21 18 20 21  ' Creatinine 0.5 - 1.1 0.8 0.7 0.7  Sodium 137 - 147 139 137 141  Potassium 3.4 - 5.3 3.9 4.3 4.1  Chloride 99 - 108 104 104 104  CO2 13 - 22 27(A) 28(A) 30(A)  Calcium 8.7 - 10.7 10.4 10.2 10.7  Total Protein 6.3 - 8.2 g/dL - - -  Alkaline Phos 25 - 125 97 84 73  AST 13 - 35 35 25 24  ALT 7 - 35 '19 19 20     ' No results found for: CEA1 / No results found for: CEA1 No results found for: PSA1 No results found for: VWU981 No results found for: XBJ478  No results found for: TOTALPROTELP, ALBUMINELP, A1GS, A2GS, BETS, BETA2SER, GAMS, MSPIKE, SPEI No results found for: TIBC, FERRITIN, IRONPCTSAT No results found for: LDH  STUDIES:  No results found.    HISTORY:   Past Medical History:  Diagnosis Date  . Arthritis    osteoarthritis  . Breast cancer ([place])   . Diabetes mellitus without complication ([place])    not on medication  . History of CVA (cerebrovascular accident)   . Hypertension   . Multiple thyroid nodules    benign    Past Surgical History:  Procedure Laterality Date  . BIOPSY THYROID     benign  . BREAST BIOPSY      Family History  Problem Relation Age of Onset  . Colon cancer Father        65s  . Breast cancer Sister        86-60  . Colon cancer Brother        84s  . Prostate cancer Brother        late 37s  . Breast cancer Half-Sister  late 39s early 32s    Social History:  reports that she has never smoked. She has never used smokeless tobacco. She reports previous alcohol use. She reports that she does not use drugs.The patient is accompanied by her daughter today.  Allergies: No Known Allergies  Current Medications: Current Outpatient Medications  Medication Sig Dispense Refill  . acetaminophen (TYLENOL) 500 MG tablet Take 500 mg by mouth at bedtime.    Marland Kitchen aspirin EC 81 MG tablet Take 81 mg by mouth daily. Swallow whole.    Marland Kitchen atorvastatin (LIPITOR) 40 MG tablet Take 40 mg by mouth daily.    . calcium carbonate (CALCIUM 600) 600 MG TABS tablet Take 600 mg by mouth 2 (two) times daily with a meal.    . dexamethasone (DECADRON) 4 MG tablet Take 1 tablet (4 mg total) by mouth as directed. 3 pills night before and 3 pills morning of first chemo, then 1 pill night before and morning of weekly chemo 30 tablet 2  . diphenhydrAMINE (BENADRYL) 25 mg capsule Take 25 mg by mouth at bedtime as needed. At night as needed for itching    . Loperamide HCl (IMODIUM PO) Take by mouth. Over the counter imodium, patient is taking    . losartan (COZAAR) 50 MG tablet Take 50 mg by mouth daily.    Marland Kitchen omeprazole (PRILOSEC) 40 MG capsule Take 1 capsule (40 mg total) by mouth daily. 30 capsule 3  . ondansetron (ZOFRAN) 4 MG tablet Take 1 tablet (4 mg total) by mouth every 4 (four) hours as needed for nausea or vomiting. 30 tablet 5  . oxyCODONE (OXY IR/ROXICODONE) 5 MG immediate release tablet SMARTSIG:1 Tablet(s) By Mouth 4-5 Times Daily    . predniSONE (DELTASONE) 5 MG tablet Take 2.5 mg by mouth 2 (two) times a week. TAKES 1/2 TABLET OF 5MG ONCE A WEEK    . prochlorperazine (COMPAZINE) 10 MG tablet Take 1 tablet (10 mg total) by mouth every 6 (six) hours as needed for nausea or vomiting. 30 tablet 5  . Vitamin D3 (VITAMIN D) 25 MCG tablet Take 1,000 Units by mouth daily.     No current facility-administered medications for this visit.     ASSESSMENT & PLAN:   Assessment: 1. Stage IB hormone and HER2 receptor positive breast cancer status post neoadjuvant trastuzumab/pertuzumab/paclitaxel.  She had an excellent response to neoadjuvant trastuzumab/pertuzumab/paclitaxel with just a tiny residual invasive carcinoma. Adjuvant HER2 targeted therapy with Kadcyla for a total of a year of HER2 targeted therapy is recommended .  Her overall condition has improved and she has decided to proceed with Kadcyla. We still need to start adjuvant hormonal therapy, and given her history of stroke, I would recommend aromatase inhibitor. She tolerated her 1st cycle of Kadcyla well.  We discussed the most common side effects of anastrozole 1 mg, including hot flashes, GI upset and muscle, bone and joint pain.  We plan to do this in a stepwise manner, so prior to that, I would recommend placing her on Prolia for her osteoporosis.   2. Mildly decreased left ventricular ejection fraction from previous echocardiogram.  We will continue to monitor this while she is on Kadcyla, due to the potential cardiotoxicities from HER2 targeted therapy.  3. Osteoporosis, she is taking vitamin-D, but not calcium.  I will have her start calcium 600 mg twice daily.  At her next visit, we will discuss treatment of osteoporosis, as we need to place her on aromatase inhibitor, which can contribute to  worsening bone density. I would recommend placing her on Prolia. We discussed the most common side effects of Prolia including fatigue, GI upset and joint pain, as well as the rare side effect of osteonecrosis of the jaw. I would like her to see her dentist prior starting Prolia  She and her daughter would like to discuss this due to potential side effects.  4. 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 I advised her to avoid Tylenol, she does not drink alcohol.  Plan:  I will plan to see her back on June  13th with a CBC and comprehensive metabolic panel prior to a 2nd cycle of Kadcyla.  Hopefully, we will be able to   start Prolia soon after that. The patient and her daughter understand the plans discussed today and are in agreement with them.  They know to contact our office if she develops issues prior to her next appointment.    Marvia Pickles, PA-C

## 2020-06-29 NOTE — Progress Notes (Signed)
Patient was in on Friday 06/03, they were asking about [facility]. Medicare does not cover the cost of these to my knowledge. I provided them with the number for [employer] in [place], they offers discounts on life alert if she qualifies.

## 2020-07-03 ENCOUNTER — Encounter: Payer: Self-pay | Admitting: Oncology

## 2020-07-06 ENCOUNTER — Telehealth: Payer: Self-pay | Admitting: Hematology and Oncology

## 2020-07-06 ENCOUNTER — Encounter: Payer: Self-pay | Admitting: Hematology and Oncology

## 2020-07-06 ENCOUNTER — Inpatient Hospital Stay (INDEPENDENT_AMBULATORY_CARE_PROVIDER_SITE_OTHER): Payer: Medicare Other | Admitting: Hematology and Oncology

## 2020-07-06 ENCOUNTER — Inpatient Hospital Stay: Payer: Medicare Other

## 2020-07-06 VITALS — BP 135/63 | HR 82 | Temp 98.6°F | Resp 16 | Ht 63.5 in | Wt 143.4 lb

## 2020-07-06 DIAGNOSIS — Z170 Estrogen receptor positive status [ER+]: Secondary | ICD-10-CM

## 2020-07-06 DIAGNOSIS — C50411 Malignant neoplasm of upper-outer quadrant of right female breast: Secondary | ICD-10-CM

## 2020-07-06 DIAGNOSIS — C50011 Malignant neoplasm of nipple and areola, right female breast: Secondary | ICD-10-CM

## 2020-07-06 LAB — COMPREHENSIVE METABOLIC PANEL
Albumin: 4.1 (ref 3.5–5.0)
Calcium: 10.8 — AB (ref 8.7–10.7)

## 2020-07-06 LAB — BASIC METABOLIC PANEL
BUN: 24 — AB (ref 4–21)
CO2: 26 — AB (ref 13–22)
Chloride: 102 (ref 99–108)
Creatinine: 0.8 (ref 0.5–1.1)
Glucose: 230
Potassium: 4 (ref 3.4–5.3)
Sodium: 136 — AB (ref 137–147)

## 2020-07-06 LAB — HEPATIC FUNCTION PANEL
ALT: 33 (ref 7–35)
AST: 42 — AB (ref 13–35)
Alkaline Phosphatase: 83 (ref 25–125)
Bilirubin, Total: 1.1

## 2020-07-06 LAB — CBC AND DIFFERENTIAL
HCT: 37 (ref 36–46)
Hemoglobin: 12.3 (ref 12.0–16.0)
Neutrophils Absolute: 3.71
Platelets: 233 (ref 150–399)
WBC: 5.8

## 2020-07-06 LAB — CBC: RBC: 4.07 (ref 3.87–5.11)

## 2020-07-06 NOTE — Telephone Encounter (Signed)
Per 6/13 LOS, patient scheduled for July Appt's.  Gave patient Appt Summary 

## 2020-07-06 NOTE — Progress Notes (Signed)
[facility]  [address] [place],  [place]  [address] [phone]  Clinic Day:  07/06/2020  Referring physician: Guadalupe Maple, MD   CHIEF COMPLAINT:  CC:  Clinical stage IB hormone and HER2 receptor positive breast cancer status post lumpectomy  Current Treatment:   Evaluate for adjuvant HER2 targeted therapy  HISTORY OF PRESENT ILLNESS:  Brandy Jacobs is a 87 y.o. 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 She presented for annual bilateral mammogram and had a palpable upper right breast mass, so underwent diagnostic bilateral mammogram and ultrasound.  There was a 2.9 cm upper right breast mass highly suspicious for malignancy.  No abnormal right axillary lymph nodes were seen in left breast did not reveal any evidence of malignancy. 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 Estrogen and progesterone receptors were positive and HER2 positive.  Ki67 was 40%.  Due to the HER2 positivity, we recommended neoadjuvant therapy with trastuzumab, pertuzumab and weekly paclitaxel weekly for 3 cycles. Echocardiogram in December revealed overall normal left ventricular size and function with an ejection fraction of 60 to 65%. She received her first cycle of neoadjuvant trastuzumab/pertuzumab /paclitaxel in January. She underwent an additional stereotactic biopsy of an area in the inferior right breast later in January to guide surgery.  Pathology revealed atypical ductal hyperplasia.  She had difficulty tolerating therapy, mainly due to diarrhea, causing dehydration and hypokalemia, but she also had cytopenias. She completed neoadjuvant therapy in March.  We skipped 2 doses of weekly paclitaxel due to toxicities.   She was then referred back to Dr. Lilia Pro for definitive surgery.  She underwent lumpectomy on April  11th.  Pathology revealed a residual 2 mm, grade 2, invasive ductal carcinoma with foci of ductal carcinoma in situ and atypical lobular hyperplasia.  Estrogen and progesterone receptors were positive and HER2 positive.  Ki-67 was less than 5%.  At her visit at Dr. Deborra Medina office, she was mildly anemic postoperatively. As she did have a small residual cancer, adjuvant HER2 targeted therapy with Kadcyla (ado-trastuzumab emtansine) is recommended instead, for a total of a year of HER2 targeted therapy.  She is also recommended for adjuvant hormonal therapy for 5 years.  She had had a fall in May where she laid in the yard for over 2 hours. She bruising and pain of the left shoulder, but normal motion.  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  She has had difficulty with memory, as well as expression since her stroke, which her daughter feels may have increased since chemotherapy in surgery.  She had a persistently elevated bilirubin, so testing for UGT1A1 was ordered. She was seen in the emergency department on May 13th due to dyspnea and chest pain since her fall. CTA chest did not reveal any evidence of pulmonary embolism or other acute cardiopulmonary abnormality. A healed fracture of the left 4th rib was seen.  Left shoulder x-ray revealed osteopenia but no acute fracture or dislocation of the shoulder.  Mild degenerative changes were seen.  A mildly displaced fracture of the left 5th rib was noted. As she already had oxycodone to use as needed, no additional recommendations were made.   Echocardiogram on May 18th revealed normal left ventricular size and function with an ejection fraction between 55 to 60%.  Bone density scan revealed osteoporosis with  a T-score of -3.8 in the femur, previously -2 and a T-score of -3.6 in the forearm.  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  She takes Tylenol for arthritis pain, but has also taken ibuprofen without difficulty.  I advised her to avoid Tylenol if possible and use ibuprofen as needed, but to take it with food. We wanted to take a stepwise approach to her treatment, so the started her on Kadcyla with plans to address her adjuvant hormonal therapy, as well as treatment of her osteoporosis after we saw how she tolerated Kadcyla.  She received her 1st cycle of Kadcyla on May 25th.  INTERVAL HISTORY:  Brandy Jacobs is here today for repeat clinical assessment prior to a 2nd cycle of Kadcyla. She denies any changes of her breasts. She reports increased generalized weakness, especially of her legs.   She reports numbness of the bilateral feet, legs, and fingertips, which seems stable. She remains fairly inactive. I demonstrated exercises she can do while seated and encouraged her to do 2 sets of 10 twice daily. She has continued issues with her memory, which appear stable. She denies fevers or chills.  She denies pain. Her appetite varies.  Her weight has decreased 1 lb in 10 days. She has not been able to get an appointment with a dentist.  REVIEW OF SYSTEMS:  Review of Systems  Constitutional:  Negative for appetite change, chills, fatigue, fever and unexpected weight change.  HENT:   Negative for lump/mass, mouth sores and sore throat.   Respiratory:  Negative for cough and shortness of breath.   Cardiovascular:  Negative for chest pain and leg swelling.  Gastrointestinal:  Negative for abdominal pain, constipation, diarrhea, nausea and vomiting.  Endocrine: Negative for hot flashes.  Genitourinary:  Negative for difficulty urinating, dysuria, frequency and hematuria.   Musculoskeletal:  Negative for arthralgias, back pain and myalgias.  Skin:  Negative for rash.  Neurological:  Negative for dizziness and headaches.  Hematological:  Negative for adenopathy. Does not bruise/bleed easily.  Psychiatric/Behavioral:  Negative  for depression and sleep disturbance. The patient is not nervous/anxious.    VITALS:  Blood pressure 135/63, pulse 82, temperature 98.6 F (37 C), temperature source Oral, resp. rate 16, height 5' 3.5" (1.613 m), weight 143 lb 6.4 oz (65 kg), SpO2 94 %.  Wt Readings from Last 3 Encounters:  07/06/20 143 lb 6.4 oz (65 kg)  06/26/20 144 lb 14.4 oz (65.7 kg)  06/17/20 145 lb (65.8 kg)    Body mass index is 25 kg/m.  Performance status (ECOG): 1 - Symptomatic but completely ambulatory  PHYSICAL EXAM:  Physical Exam Vitals and nursing note reviewed.  Constitutional:      General: She is not in acute distress.    Appearance: Normal appearance.  HENT:     Head: Normocephalic and atraumatic.     Mouth/Throat:     Mouth: Mucous membranes are moist.     Pharynx: Oropharynx is clear. No oropharyngeal exudate or posterior oropharyngeal erythema.  Eyes:     General: No scleral icterus.    Extraocular Movements: Extraocular movements intact.     Conjunctiva/sclera: Conjunctivae normal.     Pupils: Pupils are equal, round, and reactive to light.  Cardiovascular:     Rate and Rhythm: Normal rate and regular rhythm.     Heart sounds: Normal heart sounds. No murmur heard.   No friction rub.  No gallop.  Pulmonary:     Effort: Pulmonary effort is normal.     Breath sounds: Normal breath sounds. No wheezing, rhonchi or rales.  Chest:  Breasts:    Right: Normal. No swelling, bleeding, inverted nipple, mass, nipple discharge, skin change, tenderness, axillary adenopathy or supraclavicular adenopathy.     Left: Normal. No swelling, bleeding, inverted nipple, mass, nipple discharge, skin change, tenderness, axillary adenopathy or supraclavicular adenopathy.  Abdominal:     General: There is no distension.     Palpations: Abdomen is soft. There is no hepatomegaly, splenomegaly or mass.     Tenderness: There is no abdominal tenderness.  Musculoskeletal:        General: Normal range of motion.      Cervical back: Normal range of motion and neck supple. No tenderness.     Right lower leg: No edema.     Left lower leg: No edema.  Lymphadenopathy:     Cervical: No cervical adenopathy.     Upper Body:     Right upper body: No supraclavicular or axillary adenopathy.     Left upper body: No supraclavicular or axillary adenopathy.     Lower Body: No right inguinal adenopathy. No left inguinal adenopathy.  Skin:    General: Skin is warm and dry.     Coloration: Skin is not jaundiced.     Findings: No rash.  Neurological:     Mental Status: She is alert and oriented to person, place, and time.     Cranial Nerves: No cranial nerve deficit.  Psychiatric:        Mood and Affect: Mood normal.        Behavior: Behavior normal.        Thought Content: Thought content normal.   LABS:   CBC Latest Ref Rng & Units 07/06/2020 06/26/2020 06/15/2020  WBC - 5.8 6.0 6.6  Hemoglobin 12.0 - 16.0 12.3 12.0 11.8(A)  Hematocrit 36 - 46 37 35(A) 37  Platelets 150 - 399 233 151 199   CMP Latest Ref Rng & Units 07/06/2020 06/26/2020 06/15/2020  BUN 4 - 21 24(A) 18 20  Creatinine 0.5 - 1.1 0.8 0.8 0.7  Sodium 137 - 147 136(A) 139 137  Potassium 3.4 - 5.3 4.0 3.9 4.3  Chloride 99 - 108 102 104 104  CO2 13 - 22 26(A) 27(A) 28(A)  Calcium 8.7 - 10.7 10.8(A) 10.4 10.2  Total Protein 6.3 - 8.2 g/dL - - -  Alkaline Phos 25 - 125 83 97 84  AST 13 - 35 42(A) 35 25  ALT 7 - 35 33 19 19     No results found for: CEA1 / No results found for: CEA1 No results found for: PSA1 No results found for: YHC623 No results found for: CAN125  No results found for: TOTALPROTELP, ALBUMINELP, A1GS, A2GS, BETS, BETA2SER, GAMS, MSPIKE, SPEI No results found for: TIBC, FERRITIN, IRONPCTSAT No results found for: LDH  STUDIES:  No results found.    HISTORY:   Past Medical History:  Diagnosis Date   Arthritis    osteoarthritis   Breast cancer ([facility])    Diabetes mellitus without complication ([facility])    not on medication    History of CVA (cerebrovascular accident)    Hypertension    Multiple thyroid nodules    benign    Past Surgical History:  Procedure Laterality Date   BIOPSY THYROID     benign   BREAST BIOPSY      Family  History  Problem Relation Age of Onset   Colon cancer Father        25s   Breast cancer Sister        38-60   Colon cancer Brother        36s   Prostate cancer Brother        late 92s   Breast cancer Half-Sister        late 62s early 89s    Social History:  reports that she has never smoked. She has never used smokeless tobacco. She reports previous alcohol use. She reports that she does not use drugs.The patient is accompanied by her daughter today.  Allergies: No Known Allergies  Current Medications: Current Outpatient Medications  Medication Sig Dispense Refill   acetaminophen (TYLENOL) 500 MG tablet Take 500 mg by mouth at bedtime.     aspirin EC 81 MG tablet Take 81 mg by mouth daily. Swallow whole.     atorvastatin (LIPITOR) 40 MG tablet Take 40 mg by mouth daily.     calcium carbonate (CALCIUM 600) 600 MG TABS tablet Take 600 mg by mouth 2 (two) times daily with a meal.     dexamethasone (DECADRON) 4 MG tablet Take 1 tablet (4 mg total) by mouth as directed. 3 pills night before and 3 pills morning of first chemo, then 1 pill night before and morning of weekly chemo 30 tablet 2   diphenhydrAMINE (BENADRYL) 25 mg capsule Take 25 mg by mouth at bedtime as needed. At night as needed for itching     Loperamide HCl (IMODIUM PO) Take by mouth. Over the counter imodium, patient is taking     losartan (COZAAR) 50 MG tablet Take 50 mg by mouth daily.     omeprazole (PRILOSEC) 40 MG capsule Take 1 capsule (40 mg total) by mouth daily. 30 capsule 3   ondansetron (ZOFRAN) 4 MG tablet Take 1 tablet (4 mg total) by mouth every 4 (four) hours as needed for nausea or vomiting. 30 tablet 5   oxyCODONE (OXY IR/ROXICODONE) 5 MG immediate release tablet SMARTSIG:1 Tablet(s) By  Mouth 4-5 Times Daily     predniSONE (DELTASONE) 5 MG tablet Take 2.5 mg by mouth 2 (two) times a week. TAKES 1/2 TABLET OF 5MG ONCE A WEEK     prochlorperazine (COMPAZINE) 10 MG tablet Take 1 tablet (10 mg total) by mouth every 6 (six) hours as needed for nausea or vomiting. 30 tablet 5   Vitamin D3 (VITAMIN D) 25 MCG tablet Take 1,000 Units by mouth daily.     No current facility-administered medications for this visit.    ASSESSMENT & PLAN:   Assessment: 1. Stage IB hormone and HER2 receptor positive breast cancer status post neoadjuvant trastuzumab/pertuzumab/paclitaxel.  She had an excellent response to neoadjuvant trastuzumab/pertuzumab/paclitaxel with just a tiny residual invasive carcinoma. Adjuvant HER2 targeted therapy with Kadcyla for a total of a year of HER2 targeted therapy is recommended .  Her overall condition has improved and she has decided to proceed with Kadcyla. We still need to start adjuvant hormonal therapy, and given her history of stroke, I would recommend aromatase inhibitor. She tolerated her 1st cycle of Kadcyla well.  We discussed the most common side effects of anastrozole 1 mg, including hot flashes, GI upset and muscle, bone and joint pain.  I had planned to do this in a stepwise manner, so prior to that, I recommended placing her on Prolia for her osteoporosis, but wanted her to see her  dentist first. We discussed starting anastrozole now and treating the osteoporosis as soon as possible, but as she is still very weak, we will hold off on this.  2. Mildly decreased left ventricular ejection fraction from previous echocardiogram.  We will continue to monitor this while she is on Kadcyla, due to the potential cardiotoxicities from HER2 targeted therapy.  3. Osteoporosis, she is taking vitamin-D, but not calcium.  I will have her start calcium 600 mg/vitamin-D twice daily.  At her next visit, we will discuss treatment of osteoporosis, as we need to place her on  aromatase inhibitor, which can contribute to worsening bone density. I would recommend placing her on Prolia. We discussed the most common side effects of Prolia including fatigue, GI upset and joint pain, as well as the rare side effect of osteonecrosis of the jaw. I would like her to see her dentist prior starting Prolia  She and her daughter would like to discuss this due to potential side effects.  4. 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 I advised her to avoid Tylenol, she does not drink alcohol.  5. Hypercalcemia since starting oral supplement. I will have her hold calcium for now.  6. 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 If this worsens with Kadcyla, we will need to adjust the dose.  Plan:   She will proceed with a 2nd cycle of Kadcyla this week.  We will need to wait on starting Prolia until she can see her dentist. As she is still struggling with general weakness, I will also hold off on starting anastrozole as well. I will plan to see her back in 3 weeks with a CBC and comprehensive metabolic panel prior to a 3rd cycle of Kadcyla.  The patient and her daughter understand the plans discussed today and are in agreement with them.  They know to contact our office if she develops issues prior to her next appointment.    Marvia Pickles, PA-C

## 2020-07-07 ENCOUNTER — Encounter: Payer: Self-pay | Admitting: Oncology

## 2020-07-07 ENCOUNTER — Telehealth: Payer: Self-pay

## 2020-07-07 NOTE — Telephone Encounter (Signed)
Per Vida Roller M,PA: I called her last night but she wasn't there-left message with son-in-law. Her calcium is high, so I want her to stop the supplement I told her to take. Thanks   Nameer Summer,RN: @1400 - I called pt and notified her of above. She verbalized understanding. I will remove the calcium from her med list.

## 2020-07-08 ENCOUNTER — Other Ambulatory Visit: Payer: Self-pay

## 2020-07-08 ENCOUNTER — Inpatient Hospital Stay: Payer: Medicare Other

## 2020-07-08 VITALS — BP 116/58 | HR 73 | Temp 98.7°F | Resp 18 | Ht 63.5 in | Wt 144.8 lb

## 2020-07-08 DIAGNOSIS — C50411 Malignant neoplasm of upper-outer quadrant of right female breast: Secondary | ICD-10-CM

## 2020-07-08 DIAGNOSIS — Z79899 Other long term (current) drug therapy: Secondary | ICD-10-CM | POA: Diagnosis not present

## 2020-07-08 DIAGNOSIS — M810 Age-related osteoporosis without current pathological fracture: Secondary | ICD-10-CM | POA: Diagnosis not present

## 2020-07-08 DIAGNOSIS — Z170 Estrogen receptor positive status [ER+]: Secondary | ICD-10-CM | POA: Diagnosis not present

## 2020-07-08 DIAGNOSIS — G629 Polyneuropathy, unspecified: Secondary | ICD-10-CM | POA: Diagnosis not present

## 2020-07-08 DIAGNOSIS — Z5112 Encounter for antineoplastic immunotherapy: Secondary | ICD-10-CM | POA: Diagnosis not present

## 2020-07-08 DIAGNOSIS — C50911 Malignant neoplasm of unspecified site of right female breast: Secondary | ICD-10-CM | POA: Diagnosis present

## 2020-07-08 MED ORDER — SODIUM CHLORIDE 0.9 % IV SOLN
Freq: Once | INTRAVENOUS | Status: AC
  Filled 2020-07-08: qty 250

## 2020-07-08 MED ORDER — DIPHENHYDRAMINE HCL 25 MG PO CAPS
ORAL_CAPSULE | ORAL | Status: AC
  Filled 2020-07-08: qty 2

## 2020-07-08 MED ORDER — DIPHENHYDRAMINE HCL 25 MG PO CAPS
50.0000 mg | ORAL_CAPSULE | Freq: Once | ORAL | Status: AC
Start: 2020-07-08 — End: 2020-07-08
  Administered 2020-07-08: 50 mg via ORAL

## 2020-07-08 MED ORDER — HEPARIN SOD (PORK) LOCK FLUSH 100 UNIT/ML IV SOLN
500.0000 [IU] | Freq: Once | INTRAVENOUS | Status: AC | PRN
  Administered 2020-07-08: 500 [IU]
  Filled 2020-07-08: qty 5

## 2020-07-08 MED ORDER — ACETAMINOPHEN 325 MG PO TABS
650.0000 mg | ORAL_TABLET | Freq: Once | ORAL | Status: AC
  Administered 2020-07-08: 650 mg via ORAL

## 2020-07-08 MED ORDER — ACETAMINOPHEN 325 MG PO TABS
ORAL_TABLET | ORAL | Status: AC
  Filled 2020-07-08: qty 2

## 2020-07-08 MED ORDER — SODIUM CHLORIDE 0.9 % IV SOLN
3.6000 mg/kg | Freq: Once | INTRAVENOUS | Status: AC
  Administered 2020-07-08: 240 mg via INTRAVENOUS
  Filled 2020-07-08: qty 8

## 2020-07-08 NOTE — Patient Instructions (Signed)
[facility]  Discharge Instructions: Thank you for choosing [facility] to provide your oncology and hematology care.  If you have a lab appointment with the [facility], please go directly to the [facility] and check in at the registration area.   Wear comfortable clothing and clothing appropriate for easy access to any Portacath or PICC line.   We strive to give you quality time with your provider. You may need to reschedule your appointment if you arrive late (15 or more minutes).  Arriving late affects you and other patients whose appointments are after yours.  Also, if you miss three or more appointments without notifying the office, you may be dismissed from the clinic at the provider's discretion.      For prescription refill requests, have your pharmacy contact our office and allow 72 hours for refills to be completed.    Today you received the following chemotherapy and/or immunotherapy agents Trastuzumab Emtansine(Kadcyla)   To help prevent nausea and vomiting after your treatment, we encourage you to take your nausea medication as directed.  BELOW ARE SYMPTOMS THAT SHOULD BE REPORTED IMMEDIATELY: *FEVER GREATER THAN 100.4 F (38 C) OR HIGHER *CHILLS OR SWEATING *NAUSEA AND VOMITING THAT IS NOT CONTROLLED WITH YOUR NAUSEA MEDICATION *UNUSUAL SHORTNESS OF BREATH *UNUSUAL BRUISING OR BLEEDING *URINARY PROBLEMS (pain or burning when urinating, or frequent urination) *BOWEL PROBLEMS (unusual diarrhea, constipation, pain near the anus) TENDERNESS IN MOUTH AND THROAT WITH OR WITHOUT PRESENCE OF ULCERS (sore throat, sores in mouth, or a toothache) UNUSUAL RASH, SWELLING OR PAIN  UNUSUAL VAGINAL DISCHARGE OR ITCHING   Items with * indicate a potential emergency and should be followed up as soon as possible or go to the Emergency Department if any problems should occur.  Please show the CHEMOTHERAPY ALERT CARD or IMMUNOTHERAPY ALERT CARD at  check-in to the Emergency Department and triage nurse.  Should you have questions after your visit or need to cancel or reschedule your appointment, please contact [facility]  Dept: [phone]  and follow the prompts.  Office hours are 8:00 a.m. to 4:30 p.m. Monday - Friday. Please note that voicemails left after 4:00 p.m. may not be returned until the following business day.  We are closed weekends and major holidays. You have access to a nurse at all times for urgent questions. Please call the main number to the clinic Dept: [phone] and follow the prompts.  For any non-urgent questions, you may also contact your provider using MyChart. We now offer e-Visits for anyone 62 and older to request care online for non-urgent symptoms. For details visit [email or web address].   Also download the MyChart app! Go to the app store, search "MyChart", open the app, select [facility], and log in with your MyChart username and password.  Due to Covid, a mask is required upon entering the hospital/clinic. If you do not have a mask, one will be given to you upon arrival. For doctor visits, patients may have 1 support person aged 54 or older with them. For treatment visits, patients cannot have anyone with them due to current Covid guidelines and our immunocompromised population.

## 2020-07-13 ENCOUNTER — Encounter: Payer: Self-pay | Admitting: Oncology

## 2020-07-28 ENCOUNTER — Inpatient Hospital Stay (INDEPENDENT_AMBULATORY_CARE_PROVIDER_SITE_OTHER): Payer: Medicare Other | Admitting: Hematology and Oncology

## 2020-07-28 ENCOUNTER — Inpatient Hospital Stay: Payer: Medicare Other | Attending: Oncology

## 2020-07-28 ENCOUNTER — Other Ambulatory Visit: Payer: Self-pay | Admitting: Hematology and Oncology

## 2020-07-28 ENCOUNTER — Encounter: Payer: Self-pay | Admitting: Hematology and Oncology

## 2020-07-28 ENCOUNTER — Other Ambulatory Visit: Payer: Self-pay

## 2020-07-28 VITALS — BP 132/63 | HR 79 | Temp 98.3°F | Resp 18 | Ht 63.5 in | Wt 143.0 lb

## 2020-07-28 DIAGNOSIS — Z79899 Other long term (current) drug therapy: Secondary | ICD-10-CM | POA: Insufficient documentation

## 2020-07-28 DIAGNOSIS — Z170 Estrogen receptor positive status [ER+]: Secondary | ICD-10-CM

## 2020-07-28 DIAGNOSIS — T451X5A Adverse effect of antineoplastic and immunosuppressive drugs, initial encounter: Secondary | ICD-10-CM | POA: Insufficient documentation

## 2020-07-28 DIAGNOSIS — C50411 Malignant neoplasm of upper-outer quadrant of right female breast: Secondary | ICD-10-CM

## 2020-07-28 DIAGNOSIS — M810 Age-related osteoporosis without current pathological fracture: Secondary | ICD-10-CM | POA: Insufficient documentation

## 2020-07-28 DIAGNOSIS — G620 Drug-induced polyneuropathy: Secondary | ICD-10-CM | POA: Insufficient documentation

## 2020-07-28 DIAGNOSIS — Z5112 Encounter for antineoplastic immunotherapy: Secondary | ICD-10-CM | POA: Insufficient documentation

## 2020-07-28 DIAGNOSIS — C50911 Malignant neoplasm of unspecified site of right female breast: Secondary | ICD-10-CM | POA: Insufficient documentation

## 2020-07-28 LAB — BASIC METABOLIC PANEL
BUN: 16 (ref 4–21)
CO2: 30 — AB (ref 13–22)
Chloride: 101 (ref 99–108)
Creatinine: 0.7 (ref 0.5–1.1)
Glucose: 132
Potassium: 4 (ref 3.4–5.3)
Sodium: 138 (ref 137–147)

## 2020-07-28 LAB — COMPREHENSIVE METABOLIC PANEL
Albumin: 4.1 (ref 3.5–5.0)
Calcium: 10.4 (ref 8.7–10.7)

## 2020-07-28 LAB — CBC AND DIFFERENTIAL
HCT: 37 (ref 36–46)
Hemoglobin: 12.4 (ref 12.0–16.0)
Neutrophils Absolute: 3.02
Platelets: 186 (ref 150–399)
WBC: 5.8

## 2020-07-28 LAB — HEPATIC FUNCTION PANEL
ALT: 43 — AB (ref 7–35)
AST: 38 — AB (ref 13–35)
Alkaline Phosphatase: 72 (ref 25–125)
Bilirubin, Total: 0.9

## 2020-07-28 LAB — MAGNESIUM: Magnesium: 2

## 2020-07-28 NOTE — Progress Notes (Signed)
[facility]  [address] [place],  [place]  [address] [phone]  Clinic Day:  07/28/2020  Referring physician: Guadalupe Maple, MD   CHIEF COMPLAINT:  CC:  Clinical stage IB hormone and HER2 receptor positive breast cancer status post lumpectomy  Current Treatment:   Evaluate for adjuvant HER2 targeted therapy  HISTORY OF PRESENT ILLNESS:  Brandy Jacobs is a 87 y.o. 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 She presented for annual bilateral mammogram and had a palpable upper right breast mass, so underwent diagnostic bilateral mammogram and ultrasound.  There was a 2.9 cm upper right breast mass highly suspicious for malignancy.  No abnormal right axillary lymph nodes were seen in left breast did not reveal any evidence of malignancy. 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 Estrogen and progesterone receptors were positive and HER2 positive.  Ki67 was 40%.  Due to the HER2 positivity, we recommended neoadjuvant therapy with trastuzumab, pertuzumab and weekly paclitaxel weekly for 3 cycles. Echocardiogram in December revealed overall normal left ventricular size and function with an ejection fraction of 60 to 65%. She received her first cycle of neoadjuvant trastuzumab/pertuzumab /paclitaxel in January. She underwent an additional stereotactic biopsy of an area in the inferior right breast later in January to guide surgery.  Pathology revealed atypical ductal hyperplasia.  She had difficulty tolerating therapy, mainly due to diarrhea, causing dehydration and hypokalemia, but she also had cytopenias. She completed neoadjuvant therapy in March.  We skipped 2 doses of weekly paclitaxel due to toxicities.   She was then referred back to Dr. Lilia Pro for definitive surgery.  She underwent lumpectomy on April 11th.   Pathology revealed a residual 2 mm, grade 2, invasive ductal carcinoma with foci of ductal carcinoma in situ and atypical lobular hyperplasia.  Estrogen and progesterone receptors were positive and HER2 positive.  Ki-67 was less than 5%.  At her visit at Dr. Deborra Medina office, she was mildly anemic postoperatively. As she did have a small residual cancer, adjuvant HER2 targeted therapy with Kadcyla (ado-trastuzumab emtansine) is recommended instead, for a total of a year of HER2 targeted therapy.  She is also recommended for adjuvant hormonal therapy for 5 years.  She had had a fall in May where she laid in the yard for over 2 hours. She bruising and pain of the left shoulder, but normal motion.  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  She has had difficulty with memory, as well as expression since her stroke, which her daughter feels may have increased since chemotherapy in surgery.  She had a persistently elevated bilirubin, so testing for UGT1A1 was ordered. She was seen in the emergency department on May 13th due to dyspnea and chest pain since her fall. CTA chest did not reveal any evidence of pulmonary embolism or other acute cardiopulmonary abnormality. A healed fracture of the left 4th rib was seen.  Left shoulder x-ray revealed osteopenia but no acute fracture or dislocation of the shoulder.  Mild degenerative changes were seen.  A mildly displaced fracture of the left 5th rib was noted. As she already had oxycodone to use as needed, no additional recommendations were made.   Echocardiogram on May 18th revealed normal left ventricular size and function with an ejection fraction between 55 to 60%.  Bone density scan revealed osteoporosis with  a T-score of -3.8 in the femur, previously -2 and a T-score of -3.6 in the forearm.  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   She takes Tylenol for arthritis pain, but has also taken ibuprofen without difficulty.  I advised her to avoid Tylenol if possible and use ibuprofen as needed, but to take it with food. We wanted to take a stepwise approach to her treatment, so the started her on Kadcyla with plans to address her adjuvant hormonal therapy, as well as treatment of her osteoporosis after we saw how she tolerated Kadcyla.  She received her 1st cycle of Kadcyla on May 25th.  INTERVAL HISTORY:  [name] is here today for repeat clinical assessment prior to a 3rd cycle of Kadcyla. She states she tolerated her 2nd cycle fairly well. However, she did develop right mouth pain after her last treatment.  She was able to see the dentist, who felt she had a gum infection and placed her on antibiotics, which she continues.  Her daughter states she needs extensive dental work and will probably need 6 months to complete everything. She reports intermittent heartburn, for which she uses Tums as needed. The patient denies worsening fatigue or weakness. Her neuropathy seems stable.  She is able to do more around the house. She denies any changes of her breasts. She has continued issues with her memory, which appear stable. She denies fevers or chills.  She denies pain. Her appetite varies.  Her weight has been stable.  REVIEW OF SYSTEMS:  Review of Systems  Constitutional:  Positive for fatigue. Negative for appetite change, chills, fever and unexpected weight change.  HENT:   Negative for lump/mass, mouth sores and sore throat.   Respiratory:  Negative for cough and shortness of breath.   Cardiovascular:  Negative for chest pain and leg swelling.  Gastrointestinal:  Negative for abdominal pain, constipation, diarrhea, nausea and vomiting.  Endocrine: Negative for hot flashes.  Genitourinary:  Negative for difficulty urinating, dysuria, frequency and hematuria.   Musculoskeletal:  Negative for arthralgias, back pain and myalgias.  Skin:   Negative for rash.  Neurological:  Negative for dizziness and headaches.  Hematological:  Negative for adenopathy. Does not bruise/bleed easily.  Psychiatric/Behavioral:  Negative for depression and sleep disturbance. The patient is not nervous/anxious.    VITALS:  Blood pressure 132/63, pulse 79, temperature 98.3 F (36.8 C), temperature source Oral, resp. rate 18, height 5' 3.5" (1.613 m), weight 143 lb (64.9 kg), SpO2 95 %.  Wt Readings from Last 3 Encounters:  07/28/20 143 lb (64.9 kg)  07/08/20 144 lb 12 oz (65.7 kg)  07/06/20 143 lb 6.4 oz (65 kg)    Body mass index is 24.93 kg/m.  Performance status (ECOG): 1 - Symptomatic but completely ambulatory  PHYSICAL EXAM:  Physical Exam Vitals and nursing note reviewed.  Constitutional:      General: She is not in acute distress.    Appearance: Normal appearance.  HENT:     Head: Normocephalic and atraumatic.     Mouth/Throat:     Mouth: Mucous membranes are moist.     Pharynx: Oropharynx is clear. No oropharyngeal exudate or posterior oropharyngeal erythema.  Eyes:     General: No scleral icterus.    Extraocular Movements: Extraocular movements intact.     Conjunctiva/sclera: Conjunctivae normal.     Pupils: Pupils are equal, round, and reactive to light.  Cardiovascular:     Rate and Rhythm: Normal rate and regular rhythm.     Heart sounds: Normal heart sounds. No murmur heard.   No friction rub. No gallop.  Pulmonary:     Effort: Pulmonary effort is normal.     Breath sounds: Normal breath sounds. No wheezing, rhonchi or rales.  Chest:  Breasts:    Right: No axillary adenopathy or supraclavicular adenopathy.     Left: No axillary adenopathy or supraclavicular adenopathy.     Comments:   Breast exam is deferred Abdominal:     General: There is no distension.     Palpations: Abdomen is soft. There is no hepatomegaly, splenomegaly or mass.     Tenderness: There is no abdominal tenderness.  Musculoskeletal:         General: Normal range of motion.     Cervical back: Normal range of motion and neck supple. No tenderness.     Right lower leg: No edema.     Left lower leg: No edema.  Lymphadenopathy:     Cervical: No cervical adenopathy.     Upper Body:     Right upper body: No supraclavicular or axillary adenopathy.     Left upper body: No supraclavicular or axillary adenopathy.     Lower Body: No right inguinal adenopathy. No left inguinal adenopathy.  Skin:    General: Skin is warm and dry.     Coloration: Skin is not jaundiced.     Findings: No rash.  Neurological:     Mental Status: She is alert and oriented to person, place, and time.     Cranial Nerves: No cranial nerve deficit.  Psychiatric:        Mood and Affect: Mood normal.        Behavior: Behavior normal.        Thought Content: Thought content normal.   LABS:   CBC Latest Ref Rng & Units 07/28/2020 07/06/2020 06/26/2020  WBC - 5.8 5.8 6.0  Hemoglobin 12.0 - 16.0 12.4 12.3 12.0  Hematocrit 36 - 46 37 37 35(A)  Platelets 150 - 399 186 233 151   CMP Latest Ref Rng & Units 07/28/2020 07/06/2020 06/26/2020  BUN 4 - 21 16 24(A) 18  Creatinine 0.5 - 1.1 0.7 0.8 0.8  Sodium 137 - 147 138 136(A) 139  Potassium 3.4 - 5.3 4.0 4.0 3.9  Chloride 99 - 108 101 102 104  CO2 13 - 22 30(A) 26(A) 27(A)  Calcium 8.7 - 10.7 10.4 10.8(A) 10.4  Total Protein 6.3 - 8.2 g/dL - - -  Alkaline Phos 25 - 125 72 83 97  AST 13 - 35 38(A) 42(A) 35  ALT 7 - 35 43(A) 33 19     No results found for: CEA1 / No results found for: CEA1 No results found for: PSA1 No results found for: ZHG992 No results found for: CAN125  No results found for: TOTALPROTELP, ALBUMINELP, A1GS, A2GS, BETS, BETA2SER, GAMS, MSPIKE, SPEI No results found for: TIBC, FERRITIN, IRONPCTSAT No results found for: LDH  STUDIES:  No results found.    HISTORY:   Past Medical History:  Diagnosis Date  . Arthritis    osteoarthritis  . Breast cancer ([facility])   . Diabetes mellitus without  complication ([facility])    not on medication  . History of CVA (cerebrovascular accident)   . Hypertension   . Multiple thyroid nodules    benign    Past Surgical History:  Procedure Laterality Date  .  BIOPSY THYROID     benign  . BREAST BIOPSY      Family History  Problem Relation Age of Onset  . Colon cancer Father        32s  . Breast cancer Sister        80-60  . Colon cancer Brother        47s  . Prostate cancer Brother        late 39s  . Breast cancer Half-Sister        late 42s early 26s    Social History:  reports that she has never smoked. She has never used smokeless tobacco. She reports previous alcohol use. She reports that she does not use drugs.The patient is accompanied by her daughter today.  Allergies: No Known Allergies  Current Medications: Current Outpatient Medications  Medication Sig Dispense Refill  . acetaminophen (TYLENOL) 500 MG tablet Take 500 mg by mouth at bedtime.    Marland Kitchen amoxicillin (AMOXIL) 500 MG capsule Take 500 mg by mouth 3 (three) times daily.    Marland Kitchen aspirin EC 81 MG tablet Take 81 mg by mouth daily. Swallow whole.    Marland Kitchen atorvastatin (LIPITOR) 40 MG tablet Take 40 mg by mouth daily.    Marland Kitchen dexamethasone (DECADRON) 4 MG tablet Take 1 tablet (4 mg total) by mouth as directed. 3 pills night before and 3 pills morning of first chemo, then 1 pill night before and morning of weekly chemo 30 tablet 2  . diphenhydrAMINE (BENADRYL) 25 mg capsule Take 25 mg by mouth at bedtime as needed. At night as needed for itching    . Loperamide HCl (IMODIUM PO) Take by mouth. Over the counter imodium, patient is taking    . losartan (COZAAR) 50 MG tablet Take 50 mg by mouth daily.    Marland Kitchen omeprazole (PRILOSEC) 40 MG capsule Take 1 capsule (40 mg total) by mouth daily. 30 capsule 3  . ondansetron (ZOFRAN) 4 MG tablet Take 1 tablet (4 mg total) by mouth every 4 (four) hours as needed for nausea or vomiting. 30 tablet 5  . oxyCODONE (OXY IR/ROXICODONE) 5 MG immediate  release tablet SMARTSIG:1 Tablet(s) By Mouth 4-5 Times Daily    . predniSONE (DELTASONE) 5 MG tablet Take 2.5 mg by mouth 2 (two) times a week. TAKES 1/2 TABLET OF 5MG ONCE A WEEK    . prochlorperazine (COMPAZINE) 10 MG tablet Take 1 tablet (10 mg total) by mouth every 6 (six) hours as needed for nausea or vomiting. 30 tablet 5  . Vitamin D3 (VITAMIN D) 25 MCG tablet Take 1,000 Units by mouth daily.     No current facility-administered medications for this visit.    ASSESSMENT & PLAN:   Assessment:  1. Stage IB hormone and HER2 receptor positive breast cancer status post neoadjuvant trastuzumab/pertuzumab/paclitaxel.  She had an excellent response to neoadjuvant trastuzumab/pertuzumab/paclitaxel with just a tiny residual invasive carcinoma. Adjuvant HER2 targeted therapy with Kadcyla for a total of a year of HER2 targeted therapy is recommended. She is tolerating Kadcyla fairly well. We still need to start adjuvant hormonal therapy, and given her history of stroke, I would recommend aromatase inhibitor. She is now seeing the dentist, but needs extensive dental work, so we will have to hold off on Prolia.  We will decide whether not to start anastrozole in the upcoming weeks.  2. Mildly decreased left ventricular ejection fraction from previous echocardiogram.  We will continue to monitor this while she is on Kadcyla, due to  the potential cardiotoxicities from HER2 targeted therapy.  3. Osteoporosis, she is taking vitamin-D, but not calcium.  I will have her start calcium 600 mg/vitamin-D twice daily.  I recommended placing her on Prolia. She has seen the dentist and will need extensive dental work, so we will not be able to start Prolia until that is completed.  4. 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 I advised her to avoid Tylenol, she does not drink alcohol.  5. Hypercalcemia since starting oral supplement, despite  holding calcium. She states she does occasionally take Tums, so I recommended she use Pepcid instead.  6. 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 If this worsens with Kadcyla, we will need to adjust the dose.  Plan:   She will proceed with a 3rd cycle of Kadcyla this week.  We will plan to see her back in 3 weeks with a CBC and comprehensive metabolic panel prior to a 4th cycle of Kadcyla.  The patient and her daughter understand the plans discussed today and are in agreement with them.  They know to contact our office if she develops issues prior to her next appointment.    Marvia Pickles, PA-C

## 2020-07-29 ENCOUNTER — Inpatient Hospital Stay: Payer: Medicare Other

## 2020-07-29 ENCOUNTER — Other Ambulatory Visit: Payer: Self-pay

## 2020-07-29 VITALS — BP 135/62 | HR 67 | Temp 98.1°F | Resp 17

## 2020-07-29 DIAGNOSIS — T451X5A Adverse effect of antineoplastic and immunosuppressive drugs, initial encounter: Secondary | ICD-10-CM | POA: Diagnosis not present

## 2020-07-29 DIAGNOSIS — G620 Drug-induced polyneuropathy: Secondary | ICD-10-CM | POA: Diagnosis not present

## 2020-07-29 DIAGNOSIS — M810 Age-related osteoporosis without current pathological fracture: Secondary | ICD-10-CM | POA: Diagnosis not present

## 2020-07-29 DIAGNOSIS — Z170 Estrogen receptor positive status [ER+]: Secondary | ICD-10-CM | POA: Diagnosis not present

## 2020-07-29 DIAGNOSIS — Z79899 Other long term (current) drug therapy: Secondary | ICD-10-CM | POA: Diagnosis not present

## 2020-07-29 DIAGNOSIS — C50411 Malignant neoplasm of upper-outer quadrant of right female breast: Secondary | ICD-10-CM

## 2020-07-29 DIAGNOSIS — C50911 Malignant neoplasm of unspecified site of right female breast: Secondary | ICD-10-CM | POA: Diagnosis present

## 2020-07-29 DIAGNOSIS — Z5112 Encounter for antineoplastic immunotherapy: Secondary | ICD-10-CM | POA: Diagnosis present

## 2020-07-29 MED ORDER — ACETAMINOPHEN 325 MG PO TABS: 650.0000 mg | ORAL_TABLET | Freq: Once | ORAL | Status: DC

## 2020-07-29 MED ORDER — HEPARIN SOD (PORK) LOCK FLUSH 100 UNIT/ML IV SOLN
500.0000 [IU] | Freq: Once | INTRAVENOUS | Status: AC | PRN
  Administered 2020-07-29: 500 [IU]
  Filled 2020-07-29: qty 5

## 2020-07-29 MED ORDER — SODIUM CHLORIDE 0.9 % IV SOLN
3.6000 mg/kg | Freq: Once | INTRAVENOUS | Status: AC
  Administered 2020-07-29: 240 mg via INTRAVENOUS
  Filled 2020-07-29: qty 8

## 2020-07-29 MED ORDER — DIPHENHYDRAMINE HCL 25 MG PO CAPS: 50.0000 mg | ORAL_CAPSULE | Freq: Once | ORAL | Status: DC

## 2020-07-29 MED ORDER — SODIUM CHLORIDE 0.9 % IV SOLN
Freq: Once | INTRAVENOUS | Status: AC
  Filled 2020-07-29: qty 250

## 2020-07-29 MED ORDER — SODIUM CHLORIDE 0.9% FLUSH
10.0000 mL | INTRAVENOUS | Status: DC | PRN
  Administered 2020-07-29: 10 mL
  Filled 2020-07-29: qty 10

## 2020-07-29 NOTE — Patient Instructions (Signed)
[facility]  Discharge Instructions: Thank you for choosing [facility] to provide your oncology and hematology care.  If you have a lab appointment with the [facility], please go directly to the [facility] and check in at the registration area.   Wear comfortable clothing and clothing appropriate for easy access to any Portacath or PICC line.   We strive to give you quality time with your provider. You may need to reschedule your appointment if you arrive late (15 or more minutes).  Arriving late affects you and other patients whose appointments are after yours.  Also, if you miss three or more appointments without notifying the office, you may be dismissed from the clinic at the provider's discretion.      For prescription refill requests, have your pharmacy contact our office and allow 72 hours for refills to be completed.    Today you received the following chemotherapy and/or immunotherapy agents Trastuzumab emtansine    To help prevent nausea and vomiting after your treatment, we encourage you to take your nausea medication as directed.  BELOW ARE SYMPTOMS THAT SHOULD BE REPORTED IMMEDIATELY: *FEVER GREATER THAN 100.4 F (38 C) OR HIGHER *CHILLS OR SWEATING *NAUSEA AND VOMITING THAT IS NOT CONTROLLED WITH YOUR NAUSEA MEDICATION *UNUSUAL SHORTNESS OF BREATH *UNUSUAL BRUISING OR BLEEDING *URINARY PROBLEMS (pain or burning when urinating, or frequent urination) *BOWEL PROBLEMS (unusual diarrhea, constipation, pain near the anus) TENDERNESS IN MOUTH AND THROAT WITH OR WITHOUT PRESENCE OF ULCERS (sore throat, sores in mouth, or a toothache) UNUSUAL RASH, SWELLING OR PAIN  UNUSUAL VAGINAL DISCHARGE OR ITCHING   Items with * indicate a potential emergency and should be followed up as soon as possible or go to the Emergency Department if any problems should occur.  Please show the CHEMOTHERAPY ALERT CARD or IMMUNOTHERAPY ALERT CARD at check-in  to the Emergency Department and triage nurse.  Should you have questions after your visit or need to cancel or reschedule your appointment, please contact [facility]  Dept: [phone]  and follow the prompts.  Office hours are 8:00 a.m. to 4:30 p.m. Monday - Friday. Please note that voicemails left after 4:00 p.m. may not be returned until the following business day.  We are closed weekends and major holidays. You have access to a nurse at all times for urgent questions. Please call the main number to the clinic Dept: [phone] and follow the prompts.  For any non-urgent questions, you may also contact your provider using MyChart. We now offer e-Visits for anyone 85 and older to request care online for non-urgent symptoms. For details visit [email or web address].   Also download the MyChart app! Go to the app store, search "MyChart", open the app, select [facility], and log in with your MyChart username and password.  Due to Covid, a mask is required upon entering the hospital/clinic. If you do not have a mask, one will be given to you upon arrival. For doctor visits, patients may have 1 support person aged 85 or older with them. For treatment visits, patients cannot have anyone with them due to current Covid guidelines and our immunocompromised population.

## 2020-08-18 ENCOUNTER — Other Ambulatory Visit: Payer: Self-pay

## 2020-08-18 ENCOUNTER — Inpatient Hospital Stay (INDEPENDENT_AMBULATORY_CARE_PROVIDER_SITE_OTHER): Payer: Medicare Other | Admitting: Hematology and Oncology

## 2020-08-18 ENCOUNTER — Inpatient Hospital Stay: Payer: Medicare Other

## 2020-08-18 ENCOUNTER — Encounter: Payer: Self-pay | Admitting: Hematology and Oncology

## 2020-08-18 VITALS — BP 160/72 | HR 68 | Temp 98.0°F | Resp 14 | Ht 63.5 in | Wt 142.1 lb

## 2020-08-18 DIAGNOSIS — Z79899 Other long term (current) drug therapy: Secondary | ICD-10-CM

## 2020-08-18 DIAGNOSIS — T451X5D Adverse effect of antineoplastic and immunosuppressive drugs, subsequent encounter: Secondary | ICD-10-CM

## 2020-08-18 DIAGNOSIS — Z170 Estrogen receptor positive status [ER+]: Secondary | ICD-10-CM | POA: Diagnosis not present

## 2020-08-18 DIAGNOSIS — G620 Drug-induced polyneuropathy: Secondary | ICD-10-CM | POA: Diagnosis not present

## 2020-08-18 DIAGNOSIS — T451X5A Adverse effect of antineoplastic and immunosuppressive drugs, initial encounter: Secondary | ICD-10-CM | POA: Diagnosis not present

## 2020-08-18 DIAGNOSIS — M810 Age-related osteoporosis without current pathological fracture: Secondary | ICD-10-CM

## 2020-08-18 DIAGNOSIS — C50911 Malignant neoplasm of unspecified site of right female breast: Secondary | ICD-10-CM | POA: Diagnosis not present

## 2020-08-18 DIAGNOSIS — C50411 Malignant neoplasm of upper-outer quadrant of right female breast: Secondary | ICD-10-CM

## 2020-08-18 LAB — BASIC METABOLIC PANEL
BUN: 16 (ref 4–21)
CO2: 27 — AB (ref 13–22)
Chloride: 102 (ref 99–108)
Creatinine: 0.8 (ref 0.5–1.1)
Glucose: 131
Potassium: 3.5 (ref 3.4–5.3)
Sodium: 138 (ref 137–147)

## 2020-08-18 LAB — HEPATIC FUNCTION PANEL
ALT: 43 — AB (ref 7–35)
AST: 58 — AB (ref 13–35)
Alkaline Phosphatase: 77 (ref 25–125)
Bilirubin, Total: 1.2

## 2020-08-18 LAB — CBC AND DIFFERENTIAL
HCT: 38 (ref 36–46)
Hemoglobin: 12.2 (ref 12.0–16.0)
Neutrophils Absolute: 2.75
Platelets: 168 (ref 150–399)
WBC: 5.1

## 2020-08-18 LAB — CBC: RBC: 4.19 (ref 3.87–5.11)

## 2020-08-18 LAB — COMPREHENSIVE METABOLIC PANEL
Albumin: 4.2 (ref 3.5–5.0)
Calcium: 10.6 (ref 8.7–10.7)

## 2020-08-18 MED FILL — Ado-Trastuzumab Emtansine For IV Soln 160 MG: INTRAVENOUS | Qty: 12 | Status: AC

## 2020-08-18 NOTE — Progress Notes (Signed)
[facility]  [address] [place],  [place]  [address] [phone]  Clinic Day:  08/18/2020  Referring physician: Guadalupe Maple, MD   CHIEF COMPLAINT:  CC:  Clinical stage IB hormone and HER2 receptor positive breast cancer status post lumpectomy  Current Treatment:   Evaluate for adjuvant HER2 targeted therapy  HISTORY OF PRESENT ILLNESS:  Brandy Jacobs is a 87 y.o. 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 She presented for annual bilateral mammogram and had a palpable upper right breast mass, so underwent diagnostic bilateral mammogram and ultrasound.  There was a 2.9 cm upper right breast mass highly suspicious for malignancy.  No abnormal right axillary lymph nodes were seen in left breast did not reveal any evidence of malignancy. 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 Estrogen and progesterone receptors were positive and HER2 positive.  Ki67 was 40%.  Due to the HER2 positivity, we recommended neoadjuvant therapy with trastuzumab, pertuzumab and weekly paclitaxel weekly for 3 cycles. Echocardiogram in December revealed overall normal left ventricular size and function with an ejection fraction of 60 to 65%. She received her first cycle of neoadjuvant trastuzumab/pertuzumab /paclitaxel in January. She underwent an additional stereotactic biopsy of an area in the inferior right breast later in January to guide surgery.  Pathology revealed atypical ductal hyperplasia.  She had difficulty tolerating therapy, mainly due to diarrhea, causing dehydration and hypokalemia, but she also had cytopenias. She completed neoadjuvant therapy in March.  We skipped 2 doses of weekly paclitaxel due to toxicities. 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  She was then referred back to Dr. Lilia Pro for definitive  surgery.  She underwent lumpectomy on April 11th.  Pathology revealed a residual 2 mm, grade 2, invasive ductal carcinoma with foci of ductal carcinoma in situ and atypical lobular hyperplasia.  Estrogen and progesterone receptors were positive and HER2 positive.  Ki-67 was less than 5%.  At her visit at Dr. Deborra Medina office, she was mildly anemic postoperatively. As she did have a small residual cancer, adjuvant HER2 targeted therapy with Kadcyla (ado-trastuzumab emtansine) is recommended instead, for a total of a year of HER2 targeted therapy.  She is also recommended for adjuvant hormonal therapy for 5 years.  She had a fall in May where she laid in the yard for over 2 hours. She bruising and pain of the left shoulder, but normal motion.  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  She has had difficulty with memory, as well as expression since her stroke, which her daughter feels may have increased since chemotherapy in surgery.  She had a persistently elevated bilirubin, so testing for UGT1A1 was ordered. She was seen in the emergency department on May 13th due to dyspnea and chest pain since her fall. CTA chest did not reveal any evidence of pulmonary embolism or other acute cardiopulmonary abnormality. A healed fracture of the left 4th rib was seen.  Left shoulder x-ray revealed osteopenia but no acute fracture or dislocation of the shoulder.  Mild degenerative changes were seen.  A mildly displaced fracture of the left 5th rib was noted. As she already had oxycodone to use as needed, no additional recommendations were made.   Echocardiogram on May 18th revealed normal left ventricular size and function with an ejection fraction between 55 to 60%.  Bone density  scan revealed osteoporosis with a T-score of -3.8 in the femur, previously -2 and a T-score of -3.6 in the forearm.  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  She takes Tylenol for arthritis pain, but has also taken ibuprofen without difficulty.  I advised her to avoid Tylenol if possible and use ibuprofen as needed, but to take it with food. We wanted to take a stepwise approach to her treatment, so the started her on Kadcyla with plans to address her adjuvant hormonal therapy, as well as treatment of her osteoporosis after we saw how she tolerated Kadcyla.  She received her 1st cycle of Kadcyla on May 25th. She has tolerated treatment fairly well.  Initially, her neuropathy was stable and mainly presented as numbness.  She did develop with gum infection after her 2nd cycle which was treated with antibiotics.  She will need extensive dental work and will probably need about 6 months to complete this.  INTERVAL HISTORY:  Brandy Jacobs is here today for repeat clinical assessment prior to a 4th cycle of Kadcyla. She states she has had worsening neuropathy with tingling and pain of the bilateral feet and legs.  She continues to report taste changes, which may be related to her poor dentition.  She reports intermittent heartburn, for which Pepcid is effective.  She is scheduled for a full dental extraction next week. She denies any changes of her breasts. She has continued issues with her memory, which appear stable. She denies fevers or chills.  She denies pain. Her appetite varies.  Her weight has been fairly stable.  REVIEW OF SYSTEMS:  Review of Systems  Constitutional:  Positive for fatigue. Negative for appetite change, chills, fever and unexpected weight change.  HENT:   Negative for lump/mass, mouth sores and sore throat.   Respiratory:  Negative for cough and shortness of breath.   Cardiovascular:  Negative for chest pain and leg swelling.  Gastrointestinal:  Negative for abdominal pain, constipation, diarrhea, nausea and vomiting.  Endocrine: Negative for hot flashes.  Genitourinary:  Negative for  difficulty urinating, dysuria, frequency and hematuria.   Musculoskeletal:  Negative for arthralgias, back pain and myalgias.  Skin:  Negative for rash.  Neurological:  Negative for dizziness and headaches.  Hematological:  Negative for adenopathy. Does not bruise/bleed easily.  Psychiatric/Behavioral:  Negative for depression and sleep disturbance. The patient is not nervous/anxious.    VITALS:  Blood pressure (!) 160/72, pulse 68, temperature 98 F (36.7 C), resp. rate 14, height 5' 3.5" (1.613 m), weight 142 lb 1.6 oz (64.5 kg), SpO2 93 %.  Wt Readings from Last 3 Encounters:  08/18/20 142 lb 1.6 oz (64.5 kg)  07/28/20 143 lb (64.9 kg)  07/08/20 144 lb 12 oz (65.7 kg)    Body mass index is 24.78 kg/m.  Performance status (ECOG): 1 - Symptomatic but completely ambulatory  PHYSICAL EXAM:  Physical Exam Vitals and nursing note reviewed.  Constitutional:      General: She is not in acute distress.    Appearance: Normal appearance.  HENT:     Head: Normocephalic and atraumatic.     Mouth/Throat:     Mouth: Mucous membranes are moist.     Pharynx: Oropharynx is clear. No oropharyngeal exudate or posterior oropharyngeal erythema.  Eyes:     General: No scleral icterus.    Extraocular Movements: Extraocular movements intact.     Conjunctiva/sclera: Conjunctivae  normal.     Pupils: Pupils are equal, round, and reactive to light.  Cardiovascular:     Rate and Rhythm: Normal rate and regular rhythm.     Heart sounds: Normal heart sounds. No murmur heard.   No friction rub. No gallop.  Pulmonary:     Effort: Pulmonary effort is normal.     Breath sounds: Normal breath sounds. No wheezing, rhonchi or rales.  Chest:  Breasts:    Right: No axillary adenopathy or supraclavicular adenopathy.     Left: No axillary adenopathy or supraclavicular adenopathy.     Comments:   Breast exam is deferred Abdominal:     General: There is no distension.     Palpations: Abdomen is soft. There  is no hepatomegaly, splenomegaly or mass.     Tenderness: There is no abdominal tenderness.  Musculoskeletal:        General: Normal range of motion.     Cervical back: Normal range of motion and neck supple. No tenderness.     Right lower leg: No edema.     Left lower leg: No edema.  Lymphadenopathy:     Cervical: No cervical adenopathy.     Upper Body:     Right upper body: No supraclavicular or axillary adenopathy.     Left upper body: No supraclavicular or axillary adenopathy.     Lower Body: No right inguinal adenopathy. No left inguinal adenopathy.  Skin:    General: Skin is warm and dry.     Coloration: Skin is not jaundiced.     Findings: No rash.  Neurological:     Mental Status: She is alert and oriented to person, place, and time.     Cranial Nerves: No cranial nerve deficit.  Psychiatric:        Mood and Affect: Mood normal.        Behavior: Behavior normal.        Thought Content: Thought content normal.   LABS:   CBC Latest Ref Rng & Units 08/18/2020 07/28/2020 07/06/2020  WBC - 5.1 5.8 5.8  Hemoglobin 12.0 - 16.0 12.2 12.4 12.3  Hematocrit 36 - 46 38 37 37  Platelets 150 - 399 168 186 233   CMP Latest Ref Rng & Units 08/18/2020 07/28/2020 07/06/2020  BUN 4 - '21 16 16 ' 24(A)  Creatinine 0.5 - 1.1 0.8 0.7 0.8  Sodium 137 - 147 138 138 136(A)  Potassium 3.4 - 5.3 3.5 4.0 4.0  Chloride 99 - 108 102 101 102  CO2 13 - 22 27(A) 30(A) 26(A)  Calcium 8.7 - 10.7 10.6 10.4 10.8(A)  Total Protein 6.3 - 8.2 g/dL - - -  Alkaline Phos 25 - 125 77 72 83  AST 13 - 35 58(A) 38(A) 42(A)  ALT 7 - 35 43(A) 43(A) 33     No results found for: CEA1 / No results found for: CEA1 No results found for: PSA1 No results found for: CBS496 No results found for: PRF163  No results found for: TOTALPROTELP, ALBUMINELP, A1GS, A2GS, BETS, BETA2SER, GAMS, MSPIKE, SPEI No results found for: TIBC, FERRITIN, IRONPCTSAT No results found for: LDH  STUDIES:  No results found.    HISTORY:    Past Medical History:  Diagnosis Date   Arthritis    osteoarthritis   Breast cancer ([place])    Diabetes mellitus without complication ([place])    not on medication   History of CVA (cerebrovascular accident)    Hypertension    Multiple thyroid nodules  benign    Past Surgical History:  Procedure Laterality Date   BIOPSY THYROID     benign   BREAST BIOPSY      Family History  Problem Relation Age of Onset   Colon cancer Father        63s   Breast cancer Sister        82-60   Colon cancer Brother        78s   Prostate cancer Brother        late 39s   Breast cancer Half-Sister        late 77s early 89s    Social History:  reports that she has never smoked. She has never used smokeless tobacco. She reports previous alcohol use. She reports that she does not use drugs.The patient is accompanied by her daughter today.  Allergies: No Known Allergies  Current Medications: Current Outpatient Medications  Medication Sig Dispense Refill   acetaminophen (TYLENOL) 500 MG tablet Take 500 mg by mouth at bedtime.     amoxicillin (AMOXIL) 500 MG capsule Take 500 mg by mouth 3 (three) times daily.     aspirin EC 81 MG tablet Take 81 mg by mouth daily. Swallow whole.     atorvastatin (LIPITOR) 40 MG tablet Take 40 mg by mouth daily.     dexamethasone (DECADRON) 4 MG tablet Take 1 tablet (4 mg total) by mouth as directed. 3 pills night before and 3 pills morning of first chemo, then 1 pill night before and morning of weekly chemo 30 tablet 2   diphenhydrAMINE (BENADRYL) 25 mg capsule Take 25 mg by mouth at bedtime as needed. At night as needed for itching     Loperamide HCl (IMODIUM PO) Take by mouth. Over the counter imodium, patient is taking     losartan (COZAAR) 50 MG tablet Take 50 mg by mouth daily.     omeprazole (PRILOSEC) 40 MG capsule Take 1 capsule (40 mg total) by mouth daily. 30 capsule 3   ondansetron (ZOFRAN) 4 MG tablet Take 1 tablet (4 mg total) by mouth every 4 (four)  hours as needed for nausea or vomiting. 30 tablet 5   oxyCODONE (OXY IR/ROXICODONE) 5 MG immediate release tablet SMARTSIG:1 Tablet(s) By Mouth 4-5 Times Daily     predniSONE (DELTASONE) 5 MG tablet Take 2.5 mg by mouth 2 (two) times a week. TAKES 1/2 TABLET OF 5MG ONCE A WEEK     prochlorperazine (COMPAZINE) 10 MG tablet Take 1 tablet (10 mg total) by mouth every 6 (six) hours as needed for nausea or vomiting. 30 tablet 5   Vitamin D3 (VITAMIN D) 25 MCG tablet Take 1,000 Units by mouth daily.     No current facility-administered medications for this visit.    ASSESSMENT & PLAN:   Assessment:  1. Stage IB hormone and HER2 receptor positive breast cancer status post neoadjuvant trastuzumab/pertuzumab/paclitaxel.  She had an excellent response to neoadjuvant trastuzumab/pertuzumab/paclitaxel with just a tiny residual invasive carcinoma. Adjuvant HER2 targeted therapy with Kadcyla for a total of a year of HER2 targeted therapy is recommended. She initially tolerated Kadcyla fairly well, but now has worsening neuropathy. I will discontinued Kadcyla and plan to see her back in a few weeks to see how she is doing.  One option would be to switch back to trastuzumab.  The patient and her daughter states that after much discussion, she does not wish to take adjuvant hormonal therapy.  2. Mildly decreased left ventricular ejection fraction from  previous echocardiogram.  I recommended a repeat echocardiogram in 3 weeks, but the patient would like to hold off.  3. Osteoporosis, she is taking vitamin-D, but not calcium. I had started her on calcium and I recommended placing her on Prolia, but she has poor dentition. She then developed mild hypercalcemia, so calcium was discontinued. She has seen the dentist and is having dental work done.  She has decided against treatment for the osteoporosis.  4. Two copies of UGT1A1 *28 allele, consistent with a disorder of bilirubin metabolism, most likely Gilbert's  syndrome. I advised her to avoid Tylenol, she does not drink alcohol.  5. Mild hypercalcemia since starting oral supplement, despite holding calcium. She  denies using Tums.  We will continue to monitor this.  6. 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  Hopefully, this will improve with discontinuation of Kadcyla.  If not, we could consider trying gabapentin.  7.  Occasional heartburn, for which Pepcid is effective.  8.  Mildly elevated transaminases, likely due to treatment.  We will continue to monitor this.  Plan:   We will plan to see her back in 3 weeks with a CBC and comprehensive metabolic panel for repeat clinical assessment and discussion of treatment options.  The patient and her daughter understand the plans discussed today and are in agreement with them.  They know to contact our office if she develops issues prior to her next appointment.    Marvia Pickles, PA-C

## 2020-08-19 ENCOUNTER — Inpatient Hospital Stay: Payer: Medicare Other

## 2020-08-21 ENCOUNTER — Encounter: Payer: Self-pay | Admitting: Oncology

## 2020-08-27 NOTE — Progress Notes (Signed)
[facility]  [address] [place],  [place]  [address] [phone]  Clinic Day:  09/08/2020  Referring physician: Guadalupe Maple, MD  This document serves as a record of services personally performed by Hosie Poisson, MD. It was created on their behalf by [facility], a trained medical scribe. The creation of this record is based on the scribe's personal observations and the provider's statements to them.  CHIEF COMPLAINT:  CC:  Clinical stage IB hormone and HER2 receptor positive breast cancer status post lumpectomy  Current Treatment:   Evaluate for adjuvant HER2 targeted therapy  HISTORY OF PRESENT ILLNESS:  Brandy Jacobs is a 87 y.o. 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 She presented for annual bilateral mammogram and had a palpable upper right breast mass, so underwent diagnostic bilateral mammogram and ultrasound.  There was a 2.9 cm upper right breast mass highly suspicious for malignancy.  No abnormal right axillary lymph nodes were seen in left breast did not reveal any evidence of malignancy. 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 Estrogen and progesterone receptors were positive and HER2 positive.  Ki67 was 40%.  Due to the HER2 positivity, we recommended neoadjuvant therapy with trastuzumab, pertuzumab and weekly paclitaxel weekly for 3 cycles. Echocardiogram in December revealed overall normal left ventricular size and function with an ejection fraction of 60 to 65%. She received her first cycle of neoadjuvant trastuzumab/pertuzumab /paclitaxel in January. She underwent an additional stereotactic biopsy of an area in the inferior right breast later in January to guide surgery.  Pathology revealed atypical ductal hyperplasia.  She had difficulty tolerating therapy, mainly due to  diarrhea, causing dehydration and hypokalemia, but she also had cytopenias. She completed neoadjuvant therapy in March.   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  She was then referred back to Dr. Lilia Pro for definitive surgery.  She underwent lumpectomy on April 11th.  Pathology revealed a residual 2 mm, grade 2, invasive ductal carcinoma with foci of ductal carcinoma in situ and atypical lobular hyperplasia.  Estrogen and progesterone receptors were positive and HER2 positive.  Ki-67 was less than 5%.  At her visit at Dr. Deborra Medina office, she was mildly anemic postoperatively. As she did have a small residual cancer, adjuvant HER2 targeted therapy with Kadcyla (ado-trastuzumab emtansine) is recommended instead, for a total of a year of HER2 targeted therapy.  She is also recommended for adjuvant hormonal therapy for 5 years.  She has had falls.  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  She has had difficulty with memory, as well as expression since her stroke, which her daughter feels may have increased since chemotherapy and surgery.  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  Echocardiogram on May 18th revealed normal left ventricular size and function with an ejection fraction between 55 to 60%.  Bone density scan revealed osteoporosis with a T-score of -3.8 in the femur, previously -2 and a T-score of -3.6 in the forearm.  She was placed on Kadcyla adjuvant chemotherapy at the end of May and initially tolerated it fairly well.  She states she has had worsening neuropathy with tingling and pain of  the bilateral feet and legs.  It was decided to stop the Kadcyla due to increasing toxicities.  The patient declines hormonal therapy or treatment of her osteoporosis due to  toxicities and her advanced age and comorbidities.  INTERVAL HISTORY:  Brandy Jacobs is here for follow up after discontinuing Kadcyla.  She states that she feels about the same and continues to report neuropathy from her knees down to her feet. She is not sure whether she wishes to continue any therapy so we had a long discussion with her and her daughter.  She has had her teeth pulled and has been eating a liquid/soft food diet.  Her mouth is sore from her new dentures.  She also notes some anxiety.  Blood counts and chemistries are unremarkable except for a calcium of 10.3, improved, and bilirubin of 1.6, secondary to Gilbert's syndrome, and a mildly elevated SGOT of 53.  Her  appetite is good, and she has lost 5 pounds since her last visit.  She denies fever, chills or other signs of infection.  She denies nausea, vomiting, bowel issues, or abdominal pain.  She denies sore throat, cough, dyspnea, or chest pain.  REVIEW OF SYSTEMS:  Review of Systems  Constitutional: Negative.  Negative for appetite change, chills, fatigue, fever and unexpected weight change.  HENT:          Mouth pain from recent dental procedure and dentures  Eyes: Negative.   Respiratory: Negative.  Negative for chest tightness, cough, hemoptysis, shortness of breath and wheezing.   Cardiovascular:  Negative for chest pain, leg swelling and palpitations.  Gastrointestinal: Negative.  Negative for abdominal distention, abdominal pain, blood in stool, constipation, diarrhea, nausea and vomiting.  Endocrine: Negative.   Genitourinary: Negative.  Negative for difficulty urinating, dysuria, frequency and hematuria.   Musculoskeletal: Negative.  Negative for arthralgias, back pain, flank pain, gait problem and myalgias.  Skin: Negative.   Neurological:  Positive for numbness (and paresthesias from the knees down to her feet). Negative for dizziness, extremity weakness, gait problem, headaches, light-headedness, seizures and speech  difficulty.  Hematological: Negative.   Psychiatric/Behavioral:  Negative for depression and sleep disturbance. The patient is nervous/anxious.    VITALS:  Blood pressure (!) 166/72, pulse 63, temperature 98 F (36.7 C), temperature source Oral, resp. rate 18, height 5' 3.5" (1.613 m), weight 137 lb 9.6 oz (62.4 kg), SpO2 95 %.  Wt Readings from Last 3 Encounters:  09/08/20 137 lb 9.6 oz (62.4 kg)  08/18/20 142 lb 1.6 oz (64.5 kg)  07/28/20 143 lb (64.9 kg)    Body mass index is 23.99 kg/m.  Performance status (ECOG): 1 - Symptomatic but completely ambulatory  PHYSICAL EXAM:  Physical Exam Constitutional:      General: She is not in acute distress.    Appearance: Normal appearance. She is normal weight.  HENT:     Head: Normocephalic and atraumatic.  Eyes:     General: No scleral icterus.    Extraocular Movements: Extraocular movements intact.     Conjunctiva/sclera: Conjunctivae normal.     Pupils: Pupils are equal, round, and reactive to light.  Cardiovascular:     Rate and Rhythm: Normal rate and regular rhythm.     Pulses: Normal pulses.     Heart sounds: Normal heart sounds. No murmur heard.   No friction rub. No gallop.  Pulmonary:     Effort: Pulmonary effort is normal. No respiratory distress.     Breath sounds: Normal breath sounds.  Chest:  Comments: She has a healing incision of the superior right breast.  Both breasts are without masses. Abdominal:     General: Bowel sounds are normal. There is no distension.     Palpations: Abdomen is soft. There is no hepatomegaly, splenomegaly or mass.     Tenderness: There is no abdominal tenderness.  Musculoskeletal:        General: Normal range of motion.     Cervical back: Normal range of motion and neck supple.     Right lower leg: No edema.     Left lower leg: No edema.  Lymphadenopathy:     Cervical: No cervical adenopathy.  Skin:    General: Skin is warm and dry.  Neurological:     General: No focal  deficit present.     Mental Status: She is alert and oriented to person, place, and time. Mental status is at baseline.  Psychiatric:        Mood and Affect: Mood normal.        Behavior: Behavior normal.        Thought Content: Thought content normal.        Judgment: Judgment normal.   LABS:   CBC Latest Ref Rng & Units 09/08/2020 08/18/2020 07/28/2020  WBC - 4.1 5.1 5.8  Hemoglobin 12.0 - 16.0 12.0 12.2 12.4  Hematocrit 36 - 46 36 38 37  Platelets 150 - 399 159 168 186   CMP Latest Ref Rng & Units 08/18/2020 07/28/2020 07/06/2020  BUN 4 - '21 16 16 ' 24(A)  Creatinine 0.5 - 1.1 0.8 0.7 0.8  Sodium 137 - 147 138 138 136(A)  Potassium 3.4 - 5.3 3.5 4.0 4.0  Chloride 99 - 108 102 101 102  CO2 13 - 22 27(A) 30(A) 26(A)  Calcium 8.7 - 10.7 10.6 10.4 10.8(A)  Total Protein 6.3 - 8.2 g/dL - - -  Alkaline Phos 25 - 125 77 72 83  AST 13 - 35 58(A) 38(A) 42(A)  ALT 7 - 35 43(A) 43(A) 33    STUDIES:  No results found.    HISTORY:   Allergies: No Known Allergies  Current Medications: Current Outpatient Medications  Medication Sig Dispense Refill   acetaminophen (TYLENOL) 500 MG tablet Take 500 mg by mouth at bedtime.     aspirin EC 81 MG tablet Take 81 mg by mouth daily. Swallow whole.     atorvastatin (LIPITOR) 40 MG tablet Take 40 mg by mouth daily.     dexamethasone (DECADRON) 4 MG tablet Take 1 tablet (4 mg total) by mouth as directed. 3 pills night before and 3 pills morning of first chemo, then 1 pill night before and morning of weekly chemo 30 tablet 2   diphenhydrAMINE (BENADRYL) 25 mg capsule Take 25 mg by mouth at bedtime as needed. At night as needed for itching     Loperamide HCl (IMODIUM PO) Take by mouth. Over the counter imodium, patient is taking     losartan (COZAAR) 50 MG tablet Take 50 mg by mouth daily.     omeprazole (PRILOSEC) 40 MG capsule Take 1 capsule (40 mg total) by mouth daily. 30 capsule 3   ondansetron (ZOFRAN) 4 MG tablet Take 1 tablet (4 mg total) by  mouth every 4 (four) hours as needed for nausea or vomiting. 30 tablet 5   oxyCODONE (OXY IR/ROXICODONE) 5 MG immediate release tablet SMARTSIG:1 Tablet(s) By Mouth 4-5 Times Daily     predniSONE (DELTASONE) 5 MG tablet Take 2.5 mg by  mouth 2 (two) times a week. TAKES 1/2 TABLET OF 5MG ONCE A WEEK     prochlorperazine (COMPAZINE) 10 MG tablet Take 1 tablet (10 mg total) by mouth every 6 (six) hours as needed for nausea or vomiting. 30 tablet 5   Vitamin D3 (VITAMIN D) 25 MCG tablet Take 1,000 Units by mouth daily.     No current facility-administered medications for this visit.    ASSESSMENT & PLAN:   Assessment: 1. 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  She had an excellent response to this, with just a tiny residual invasive carcinoma. She received 4 cycles of adjuvant Kadcyla and this was discontinued due to toxicities.   My recommendation would be to switch back to single agent trastuzumab.  We discussed the risks and benefits, and the fact that we could stop it at any time.  Since she has already had pre- and post-op treatments, she would be due to take 11 more doses to complete 1 year.  The patient and her daughter states that after much discussion, she does not wish to take adjuvant hormonal therapy.  2. Mildly decreased left ventricular ejection fraction from previous echocardiogram.  She will still need periodic echocardiograms if she remains on trastuzumab, so we could schedule that as it has been 3 months.  3. Osteoporosis, she is taking vitamin-D, but not calcium. I had started her on calcium and I recommended placing her on Prolia, but she has poor dentition. She then developed mild hypercalcemia, so calcium was discontinued.  She has decided against treatment for the osteoporosis.  4. 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 I  advised her to avoid Tylenol, she does not drink alcohol.  5. Mild hypercalcemia, improved.  We will continue to monitor this.  6. 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  Hopefully, this will improve with discontinuation of Kadcyla.  If not, we could consider trying gabapentin.  Plan:    Since discontinuing Kadcyla, the patient report stable neuropathy.  Hopefully this will improve the longer she is off this therapy.  Other treatment options were reviewed such as single agent trastuzumab, and she is willing to try this.  The patient has opted against endocrine therapy due to the potential side effects, and has declined treatment for her osteoporosis.  We will plan to resume treatment next week, and will see her back 3 weeks later with CBC and CMP to see how she tolerated therapy.  The goal is to complete a full year of the trastuzumab and she already had neoadjuvant and adjuvant for 4 cycles.  We will plan an ECHO now.  The patient and her daughter understand the plans discussed today and are in agreement with them.  They know to contact our office if she develops issues prior to her next appointment.   Derwood Kaplan, MD [place] [facility]   . I, Rita Ohara, am acting as scribe for Derwood Kaplan, MD  I have reviewed this report as typed by the medical scribe, and it is complete and accurate.

## 2020-09-07 ENCOUNTER — Encounter: Payer: Self-pay | Admitting: Hematology and Oncology

## 2020-09-07 NOTE — Progress Notes (Unsigned)
Per [employer] web-page:  No PA required for CPT 93306 ECHO

## 2020-09-08 ENCOUNTER — Encounter: Payer: Self-pay | Admitting: Oncology

## 2020-09-08 ENCOUNTER — Other Ambulatory Visit: Payer: Self-pay | Admitting: Pharmacist

## 2020-09-08 ENCOUNTER — Telehealth: Payer: Self-pay | Admitting: Oncology

## 2020-09-08 ENCOUNTER — Inpatient Hospital Stay: Payer: Medicare Other

## 2020-09-08 ENCOUNTER — Inpatient Hospital Stay: Payer: Medicare Other | Attending: Oncology | Admitting: Oncology

## 2020-09-08 VITALS — BP 166/72 | HR 63 | Temp 98.0°F | Resp 18 | Ht 63.5 in | Wt 137.6 lb

## 2020-09-08 DIAGNOSIS — C50011 Malignant neoplasm of nipple and areola, right female breast: Secondary | ICD-10-CM | POA: Diagnosis not present

## 2020-09-08 DIAGNOSIS — Z170 Estrogen receptor positive status [ER+]: Secondary | ICD-10-CM | POA: Insufficient documentation

## 2020-09-08 DIAGNOSIS — M810 Age-related osteoporosis without current pathological fracture: Secondary | ICD-10-CM | POA: Insufficient documentation

## 2020-09-08 DIAGNOSIS — G629 Polyneuropathy, unspecified: Secondary | ICD-10-CM | POA: Insufficient documentation

## 2020-09-08 DIAGNOSIS — Z79899 Other long term (current) drug therapy: Secondary | ICD-10-CM | POA: Insufficient documentation

## 2020-09-08 DIAGNOSIS — C50411 Malignant neoplasm of upper-outer quadrant of right female breast: Secondary | ICD-10-CM

## 2020-09-08 DIAGNOSIS — C50911 Malignant neoplasm of unspecified site of right female breast: Secondary | ICD-10-CM | POA: Insufficient documentation

## 2020-09-08 DIAGNOSIS — Z5112 Encounter for antineoplastic immunotherapy: Secondary | ICD-10-CM | POA: Insufficient documentation

## 2020-09-08 LAB — BASIC METABOLIC PANEL
BUN: 14 (ref 4–21)
CO2: 27 — AB (ref 13–22)
Chloride: 103 (ref 99–108)
Creatinine: 0.8 (ref 0.5–1.1)
Glucose: 110
Potassium: 3.7 (ref 3.4–5.3)
Sodium: 138 (ref 137–147)

## 2020-09-08 LAB — CBC AND DIFFERENTIAL
HCT: 36 (ref 36–46)
Hemoglobin: 12 (ref 12.0–16.0)
Neutrophils Absolute: 2.13
Platelets: 159 (ref 150–399)
WBC: 4.1

## 2020-09-08 LAB — HEPATIC FUNCTION PANEL
ALT: 32 (ref 7–35)
AST: 53 — AB (ref 13–35)
Alkaline Phosphatase: 75 (ref 25–125)
Bilirubin, Total: 1.6

## 2020-09-08 LAB — COMPREHENSIVE METABOLIC PANEL
Albumin: 4.1 (ref 3.5–5.0)
Calcium: 10.3 (ref 8.7–10.7)

## 2020-09-08 LAB — CBC: RBC: 3.99 (ref 3.87–5.11)

## 2020-09-08 NOTE — Telephone Encounter (Signed)
Per 8/16 LOS, called daughter w/patient's Appts.  8/25 1:00 pm - 9/15 Labs, Follow Up w/Dr Hinton Rao 1:30 pm

## 2020-09-09 ENCOUNTER — Other Ambulatory Visit: Payer: Self-pay | Admitting: Oncology

## 2020-09-09 ENCOUNTER — Inpatient Hospital Stay: Payer: Medicare Other

## 2020-09-09 ENCOUNTER — Encounter: Payer: Self-pay | Admitting: Oncology

## 2020-09-09 DIAGNOSIS — C50011 Malignant neoplasm of nipple and areola, right female breast: Secondary | ICD-10-CM

## 2020-09-09 DIAGNOSIS — Z170 Estrogen receptor positive status [ER+]: Secondary | ICD-10-CM

## 2020-09-11 ENCOUNTER — Encounter: Payer: Self-pay | Admitting: Oncology

## 2020-09-15 ENCOUNTER — Encounter: Payer: Self-pay | Admitting: Oncology

## 2020-09-15 DIAGNOSIS — I361 Nonrheumatic tricuspid (valve) insufficiency: Secondary | ICD-10-CM | POA: Diagnosis not present

## 2020-09-15 DIAGNOSIS — I340 Nonrheumatic mitral (valve) insufficiency: Secondary | ICD-10-CM | POA: Diagnosis not present

## 2020-09-17 ENCOUNTER — Inpatient Hospital Stay: Payer: Medicare Other

## 2020-09-17 ENCOUNTER — Other Ambulatory Visit: Payer: Self-pay

## 2020-09-17 DIAGNOSIS — Z170 Estrogen receptor positive status [ER+]: Secondary | ICD-10-CM | POA: Diagnosis not present

## 2020-09-17 DIAGNOSIS — C50911 Malignant neoplasm of unspecified site of right female breast: Secondary | ICD-10-CM | POA: Diagnosis present

## 2020-09-17 DIAGNOSIS — G629 Polyneuropathy, unspecified: Secondary | ICD-10-CM | POA: Diagnosis not present

## 2020-09-17 DIAGNOSIS — Z5112 Encounter for antineoplastic immunotherapy: Secondary | ICD-10-CM | POA: Diagnosis not present

## 2020-09-17 DIAGNOSIS — Z79899 Other long term (current) drug therapy: Secondary | ICD-10-CM | POA: Diagnosis not present

## 2020-09-17 DIAGNOSIS — M810 Age-related osteoporosis without current pathological fracture: Secondary | ICD-10-CM | POA: Diagnosis not present

## 2020-09-17 DIAGNOSIS — C50411 Malignant neoplasm of upper-outer quadrant of right female breast: Secondary | ICD-10-CM

## 2020-09-17 MED ORDER — ACETAMINOPHEN 325 MG PO TABS: 650.0000 mg | ORAL_TABLET | Freq: Once | ORAL | Status: DC

## 2020-09-17 MED ORDER — TRASTUZUMAB-ANNS CHEMO 150 MG IV SOLR
6.0000 mg/kg | Freq: Once | INTRAVENOUS | Status: AC
  Administered 2020-09-17: 378 mg via INTRAVENOUS
  Filled 2020-09-17: qty 18

## 2020-09-17 MED ORDER — SODIUM CHLORIDE 0.9% FLUSH
10.0000 mL | INTRAVENOUS | Status: DC | PRN
  Administered 2020-09-17: 10 mL

## 2020-09-17 MED ORDER — DIPHENHYDRAMINE HCL 25 MG PO CAPS: 50.0000 mg | ORAL_CAPSULE | Freq: Once | ORAL | Status: DC

## 2020-09-17 MED ORDER — HEPARIN SOD (PORK) LOCK FLUSH 100 UNIT/ML IV SOLN
500.0000 [IU] | Freq: Once | INTRAVENOUS | Status: AC | PRN
Start: 2020-09-17 — End: 2020-09-17
  Administered 2020-09-17: 500 [IU]

## 2020-09-17 MED ORDER — SODIUM CHLORIDE 0.9 % IV SOLN
Freq: Once | INTRAVENOUS | Status: AC
Start: 2020-09-17 — End: 2020-09-17

## 2020-09-17 NOTE — Patient Instructions (Signed)
Trastuzumab injection for infusion What is this medication? TRASTUZUMAB (tras TOO zoo mab) is a monoclonal antibody. It is used to treat breast cancer and stomach cancer. This medicine may be used for other purposes; ask your health care provider or pharmacist if you have questions. COMMON BRAND NAME(S): Herceptin, Herzuma, KANJINTI, Ogivri, Ontruzant, Trazimera What should I tell my care team before I take this medication? They need to know if you have any of these conditions: heart disease heart failure lung or breathing disease, like asthma an unusual or allergic reaction to trastuzumab, benzyl alcohol, or other medications, foods, dyes, or preservatives pregnant or trying to get pregnant breast-feeding How should I use this medication? This drug is given as an infusion into a vein. It is administered in a hospital or clinic by a specially trained health care professional. Talk to your pediatrician regarding the use of this medicine in children. This medicine is not approved for use in children. Overdosage: If you think you have taken too much of this medicine contact a poison control center or emergency room at once. NOTE: This medicine is only for you. Do not share this medicine with others. What if I miss a dose? It is important not to miss a dose. Call your doctor or health care professional if you are unable to keep an appointment. What may interact with this medication? This medicine may interact with the following medications: certain types of chemotherapy, such as daunorubicin, doxorubicin, epirubicin, and idarubicin This list may not describe all possible interactions. Give your health care provider a list of all the medicines, herbs, non-prescription drugs, or dietary supplements you use. Also tell them if you smoke, drink alcohol, or use illegal drugs. Some items may interact with your medicine. What should I watch for while using this medication? Visit your doctor for checks  on your progress. Report any side effects. Continue your course of treatment even though you feel ill unless your doctor tells you to stop. Call your doctor or health care professional for advice if you get a fever, chills or sore throat, or other symptoms of a cold or flu. Do not treat yourself. Try to avoid being around people who are sick. You may experience fever, chills and shaking during your first infusion. These effects are usually mild and can be treated with other medicines. Report any side effects during the infusion to your health care professional. Fever and chills usually do not happen with later infusions. Do not become pregnant while taking this medicine or for 7 months after stopping it. Women should inform their doctor if they wish to become pregnant or think they might be pregnant. Women of child-bearing potential will need to have a negative pregnancy test before starting this medicine. There is a potential for serious side effects to an unborn child. Talk to your health care professional or pharmacist for more information. Do not breast-feed an infant while taking this medicine or for 7 months after stopping it. Women must use effective birth control with this medicine. What side effects may I notice from receiving this medication? Side effects that you should report to your doctor or health care professional as soon as possible: allergic reactions like skin rash, itching or hives, swelling of the face, lips, or tongue chest pain or palpitations cough dizziness feeling faint or lightheaded, falls fever general ill feeling or flu-like symptoms signs of worsening heart failure like breathing problems; swelling in your legs and feet unusually weak or tired Side effects that usually   do not require medical attention (report to your doctor or health care professional if they continue or are bothersome): bone pain changes in taste diarrhea joint pain nausea/vomiting weight  loss This list may not describe all possible side effects. Call your doctor for medical advice about side effects. You may report side effects to FDA at 1-800-FDA-1088. Where should I keep my medication? This drug is given in a hospital or clinic and will not be stored at home. NOTE: This sheet is a summary. It may not cover all possible information. If you have questions about this medicine, talk to your doctor, pharmacist, or health care provider.  2022 Elsevier/Gold Standard (2016-01-05 14:37:52)  

## 2020-10-01 NOTE — Progress Notes (Signed)
[facility]  [address] [place],  [place]  [address] [phone]  Clinic Day:  10/08/2020  Referring physician: Guadalupe Maple, MD  This document serves as a record of services personally performed by Hosie Poisson, MD. It was created on their behalf by [facility], a trained medical scribe. The creation of this record is based on the scribe's personal observations and the provider's statements to them.  CHIEF COMPLAINT:  CC:  Clinical stage IB hormone and HER2 receptor positive breast cancer status post lumpectomy  Current Treatment:   Adjuvant HER2 targeted therapy  HISTORY OF PRESENT ILLNESS:  Brandy Jacobs is a 87 y.o. 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 She presented for annual bilateral mammogram and had a palpable upper right breast mass, so underwent diagnostic bilateral mammogram and ultrasound.  There was a 2.9 cm upper right breast mass highly suspicious for malignancy.  No abnormal right axillary lymph nodes were seen in left breast did not reveal any evidence of malignancy. 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 Estrogen and progesterone receptors were positive and HER2 positive.  Ki67 was 40%.  Due to the HER2 positivity, we recommended neoadjuvant therapy with trastuzumab, pertuzumab and weekly paclitaxel weekly for 3 cycles. Echocardiogram in December revealed overall normal left ventricular size and function with an ejection fraction of 60 to 65%. She received her first cycle of neoadjuvant trastuzumab/pertuzumab /paclitaxel in January. She underwent an additional stereotactic biopsy of an area in the inferior right breast later in January to guide surgery.  Pathology revealed atypical ductal hyperplasia.  She had difficulty tolerating therapy, mainly due to diarrhea, causing  dehydration and hypokalemia, but she also had cytopenias. She completed neoadjuvant therapy in March.   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  She was then referred back to Dr. Lilia Pro for definitive surgery.  She underwent lumpectomy on April 11th.  Pathology revealed a residual 2 mm, grade 2, invasive ductal carcinoma with foci of ductal carcinoma in situ and atypical lobular hyperplasia.  Estrogen and progesterone receptors were positive and HER2 positive.  Ki-67 was less than 5%.  At her visit at Dr. Deborra Medina office, she was mildly anemic postoperatively. As she did have a small residual cancer, adjuvant HER2 targeted therapy with Kadcyla (ado-trastuzumab emtansine) is recommended instead, for a total of a year of HER2 targeted therapy.  She is also recommended for adjuvant hormonal therapy for 5 years.  She has had falls.  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  She has had difficulty with memory, as well as expression since her stroke, which her daughter feels may have increased since chemotherapy and surgery.  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  Echocardiogram on May 18th revealed normal left ventricular size and function with an ejection fraction between 55 to 60%.  Bone density scan revealed osteoporosis with a T-score of -3.8 in the femur, previously -2 and a T-score of -3.6 in the forearm.  She was placed on Kadcyla adjuvant chemotherapy at the end of May and initially tolerated it fairly well.  She states she has had worsening neuropathy with tingling and pain of the bilateral  feet and legs.  It was decided to stop the Kadcyla due to increasing toxicities.  The patient declines hormonal therapy or treatment of her osteoporosis due to toxicities and her  advanced age and comorbidities. She was placed on single agent trastuzumab every 3 weeks.  INTERVAL HISTORY:  [name] is here for routine follow up prior to her 2nd cycle of trastuzumab.  She states that she tolerated her 1st cycle without difficulty. ECHO from August 23rd revealed a normal EF between 55 - 60 %. Her biggest concern is that she keeps losing weight. She has had her teeth pulled so eats a soft diet, but eats well. Blood counts and chemistries are unremarkable except for a calcium of 11.0, a total bilirubin of 1.9, previously 1.6 and mildly elevated liver transaminases. I will add a PTH and TSH for further evaluation of her hypercalcemia. Her  appetite is good, but she has lost 2 and 1/2 pounds since her last visit.  She denies fever, chills or other signs of infection.  She denies nausea, vomiting, bowel issues, or abdominal pain.  She denies sore throat, cough, dyspnea, or chest pain.  REVIEW OF SYSTEMS:  Review of Systems  Constitutional:  Positive for unexpected weight change (weight loss). Negative for appetite change, chills, fatigue and fever.  HENT:  Negative.    Eyes: Negative.   Respiratory: Negative.  Negative for chest tightness, cough, hemoptysis, shortness of breath and wheezing.   Cardiovascular: Negative.  Negative for chest pain, leg swelling and palpitations.  Gastrointestinal: Negative.  Negative for abdominal distention, abdominal pain, blood in stool, constipation, diarrhea, nausea and vomiting.  Endocrine: Negative.   Genitourinary: Negative.  Negative for difficulty urinating, dysuria, frequency and hematuria.   Musculoskeletal:  Positive for gait problem (uses a walker). Negative for arthralgias, back pain, flank pain and myalgias.  Skin: Negative.   Neurological:  Positive for gait problem (uses a walker). Negative for dizziness, extremity weakness, headaches, light-headedness, numbness, seizures and speech difficulty.  Hematological: Negative.    Psychiatric/Behavioral: Negative.  Negative for depression and sleep disturbance. The patient is not nervous/anxious.    VITALS:  Blood pressure (!) 126/59, pulse 60, temperature 98.3 F (36.8 C), temperature source Oral, resp. rate 18, height 5' 3.5" (1.613 m), weight 134 lb 6.4 oz (61 kg), SpO2 97 %.  Wt Readings from Last 3 Encounters:  10/08/20 134 lb 6.4 oz (61 kg)  09/08/20 137 lb 9.6 oz (62.4 kg)  08/18/20 142 lb 1.6 oz (64.5 kg)    Body mass index is 23.43 kg/m.  Performance status (ECOG): 1 - Symptomatic but completely ambulatory  PHYSICAL EXAM:  Physical Exam Constitutional:      General: She is not in acute distress.    Appearance: Normal appearance. She is normal weight.  HENT:     Head: Normocephalic and atraumatic.  Eyes:     General: No scleral icterus.    Extraocular Movements: Extraocular movements intact.     Conjunctiva/sclera: Conjunctivae normal.     Pupils: Pupils are equal, round, and reactive to light.  Cardiovascular:     Rate and Rhythm: Normal rate and regular rhythm.     Pulses: Normal pulses.     Heart sounds: Normal heart sounds. No murmur heard.   No friction rub. No gallop.  Pulmonary:     Effort: Pulmonary effort is normal. No respiratory distress.     Breath sounds: Normal breath sounds.  Chest:     Comments: Breasts are without masses. Abdominal:  General: Bowel sounds are normal. There is no distension.     Palpations: Abdomen is soft. There is no hepatomegaly, splenomegaly or mass.     Tenderness: There is no abdominal tenderness.  Musculoskeletal:        General: Normal range of motion.     Cervical back: Normal range of motion and neck supple.     Right lower leg: Edema (trace) present.     Left lower leg: Edema (trace) present.     Comments: Kyphosis  Lymphadenopathy:     Cervical: No cervical adenopathy.  Skin:    General: Skin is warm and dry.  Neurological:     General: No focal deficit present.     Mental Status:  She is alert and oriented to person, place, and time. Mental status is at baseline.  Psychiatric:        Mood and Affect: Mood normal.        Behavior: Behavior normal.        Thought Content: Thought content normal.        Judgment: Judgment normal.   LABS:   CBC Latest Ref Rng & Units 10/08/2020 09/08/2020 08/18/2020  WBC - 4.6 4.1 5.1  Hemoglobin 12.0 - 16.0 12.1 12.0 12.2  Hematocrit 36 - 46 37 36 38  Platelets 150 - 399 167 159 168   CMP Latest Ref Rng & Units 10/08/2020 09/08/2020 08/18/2020  BUN 4 - _0 Creatinine 0.5 - 1.1 0.9 0.8 0.8  Sodium 137 - 147 141 138 138  Potassium 3.4 - 5.3 4.0 3.7 3.5  Chloride 99 - 108 103 103 102  CO2 13 - 22 29(A) 27(A) 27(A)  Calcium 8.7 - 10.7 11.0(A) 10.3 10.6  Total Protein 6.3 - 8.2 g/dL - - -  Alkaline Phos 25 - 125 61 75 77  AST 13 - 35 51(A) 53(A) 58(A)  ALT 7 - 35 37(A) 32 43(A)    STUDIES:  No results found.   ECHO: 09/15/2020 1. Overall left ventricular systolic function is normal with, an EF between 55 - 60 %. 2. The diastolic filling pattern indicates impaired relaxation. 3. Left atrium is moderately dilated by volume. 4. Mild mitral regurgitation is present. 5. Mild tricuspid regurgitation present. 6. The right ventricular systolic pressure, as measured by Doppler, is 24 mmhg. 7. GLS - 19.5  HISTORY:   Allergies: No Known Allergies  Current Medications: Current Outpatient Medications  Medication Sig Dispense Refill   acetaminophen (TYLENOL) 500 MG tablet Take 500 mg by mouth at bedtime.     aspirin EC 81 MG tablet Take 81 mg by mouth daily. Swallow whole.     atorvastatin (LIPITOR) 40 MG tablet Take 40 mg by mouth daily.     dexamethasone (DECADRON) 4 MG tablet Take 1 tablet (4 mg total) by mouth as directed. 3 pills night before and 3 pills morning of first chemo, then 1 pill night before and morning of weekly chemo 30 tablet 2   diphenhydrAMINE (BENADRYL) 25 mg capsule Take 25 mg by mouth at bedtime as  needed. At night as needed for itching     Loperamide HCl (IMODIUM PO) Take by mouth. Over the counter imodium, patient is taking     losartan (COZAAR) 50 MG tablet Take 50 mg by mouth daily.     omeprazole (PRILOSEC) 40 MG capsule Take 1 capsule (40 mg total) by mouth daily. 30 capsule 3   ondansetron (ZOFRAN) 4 MG tablet Take 1 tablet (  4 mg total) by mouth every 4 (four) hours as needed for nausea or vomiting. 30 tablet 5   oxyCODONE (OXY IR/ROXICODONE) 5 MG immediate release tablet SMARTSIG:1 Tablet(s) By Mouth 4-5 Times Daily     predniSONE (DELTASONE) 5 MG tablet Take 2.5 mg by mouth 2 (two) times a week. TAKES 1/2 TABLET OF 5MG ONCE A WEEK     prochlorperazine (COMPAZINE) 10 MG tablet Take 1 tablet (10 mg total) by mouth every 6 (six) hours as needed for nausea or vomiting. 30 tablet 5   Vitamin D3 (VITAMIN D) 25 MCG tablet Take 1,000 Units by mouth daily.     No current facility-administered medications for this visit.    ASSESSMENT & PLAN:   Assessment: 1. 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  She had an excellent response to this, with just a tiny residual invasive carcinoma. She received 4 cycles of adjuvant Kadcyla and this was discontinued due to toxicities.   She has started single agent trastuzumab and is tolerating well.  Since she has already had pre- and post-op treatments, she would be due to take 11 more doses to complete 1 year.  The patient and her daughter states that after much discussion, she does not wish to take adjuvant hormonal therapy.  2. Mildly decreased left ventricular ejection fraction from previous echocardiogram, improved.  She will still need periodic echocardiograms if she remains on trastuzumab.  3. Osteoporosis, she is taking vitamin-D, but not calcium. I had started her on calcium and I recommended placing her on Prolia, but she has poor dentition. She then developed mild  hypercalcemia, so calcium was discontinued.  She has decided against treatment for the osteoporosis.  4. 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 I advised her to avoid Tylenol, she does not drink alcohol.  5. Hypercalcemia, worsened.  I will check a PTH and TSH today for further evaluation. We will continue to monitor this.  6. 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  Hopefully, this will improve with discontinuation of Kadcyla.  If not, we could consider trying gabapentin.  Plan:    She will proceed with a 2nd cycle of trastuzumab tomorrow. The patient has opted against endocrine therapy due to the potential side effects, and has declined treatment for her osteoporosis.  In view of her worsening hypercalcemia, I will order a PTH for further evaluation. I will also check a TSH. We will see her back in 3 weeks with CBC and CMP prior to her next cycle.  The goal is to complete a full year of the trastuzumab and she already had neoadjuvant and adjuvant for 4 cycles. The patient and her daughter understand the plans discussed today and are in agreement with them.  They know to contact our office if she develops issues prior to her next appointment.   Derwood Kaplan, MD [facility], am acting as scribe for Derwood Kaplan, MD  I have reviewed this report as typed by the medical scribe, and it is complete and accurate.

## 2020-10-08 ENCOUNTER — Other Ambulatory Visit: Payer: Self-pay | Admitting: Hematology and Oncology

## 2020-10-08 ENCOUNTER — Other Ambulatory Visit: Payer: Self-pay

## 2020-10-08 ENCOUNTER — Other Ambulatory Visit: Payer: Self-pay | Admitting: Oncology

## 2020-10-08 ENCOUNTER — Inpatient Hospital Stay: Payer: Medicare Other | Attending: Oncology

## 2020-10-08 ENCOUNTER — Encounter: Payer: Self-pay | Admitting: Oncology

## 2020-10-08 ENCOUNTER — Inpatient Hospital Stay (INDEPENDENT_AMBULATORY_CARE_PROVIDER_SITE_OTHER): Payer: Medicare Other | Admitting: Oncology

## 2020-10-08 VITALS — BP 126/59 | HR 60 | Temp 98.3°F | Resp 18 | Ht 63.5 in | Wt 134.4 lb

## 2020-10-08 DIAGNOSIS — Z170 Estrogen receptor positive status [ER+]: Secondary | ICD-10-CM | POA: Diagnosis not present

## 2020-10-08 DIAGNOSIS — M810 Age-related osteoporosis without current pathological fracture: Secondary | ICD-10-CM | POA: Diagnosis not present

## 2020-10-08 DIAGNOSIS — C50911 Malignant neoplasm of unspecified site of right female breast: Secondary | ICD-10-CM | POA: Insufficient documentation

## 2020-10-08 DIAGNOSIS — C50011 Malignant neoplasm of nipple and areola, right female breast: Secondary | ICD-10-CM | POA: Diagnosis not present

## 2020-10-08 DIAGNOSIS — G629 Polyneuropathy, unspecified: Secondary | ICD-10-CM | POA: Diagnosis not present

## 2020-10-08 DIAGNOSIS — C50411 Malignant neoplasm of upper-outer quadrant of right female breast: Secondary | ICD-10-CM

## 2020-10-08 DIAGNOSIS — Z5112 Encounter for antineoplastic immunotherapy: Secondary | ICD-10-CM | POA: Diagnosis not present

## 2020-10-08 DIAGNOSIS — Z79899 Other long term (current) drug therapy: Secondary | ICD-10-CM | POA: Diagnosis not present

## 2020-10-08 LAB — BASIC METABOLIC PANEL
BUN: 17 (ref 4–21)
CO2: 29 — AB (ref 13–22)
Chloride: 103 (ref 99–108)
Creatinine: 0.9 (ref 0.5–1.1)
Glucose: 140
Potassium: 4 (ref 3.4–5.3)
Sodium: 141 (ref 137–147)

## 2020-10-08 LAB — TSH: TSH: 0.091 u[IU]/mL — ABNORMAL LOW (ref 0.350–4.500)

## 2020-10-08 LAB — CBC
MCV: 92 (ref 81–99)
RBC: 4.07 (ref 3.87–5.11)

## 2020-10-08 LAB — COMPREHENSIVE METABOLIC PANEL
Albumin: 4.1 (ref 3.5–5.0)
Calcium: 11 — AB (ref 8.7–10.7)

## 2020-10-08 LAB — HEPATIC FUNCTION PANEL
ALT: 37 — AB (ref 7–35)
AST: 51 — AB (ref 13–35)
Alkaline Phosphatase: 61 (ref 25–125)
Bilirubin, Total: 1.9

## 2020-10-08 LAB — CBC AND DIFFERENTIAL
HCT: 37 (ref 36–46)
Hemoglobin: 12.1 (ref 12.0–16.0)
Neutrophils Absolute: 2.62
Platelets: 167 (ref 150–399)
WBC: 4.6

## 2020-10-08 MED FILL — Trastuzumab-anns For IV Soln 150 MG: INTRAVENOUS | Qty: 18 | Status: AC

## 2020-10-09 ENCOUNTER — Inpatient Hospital Stay: Payer: Medicare Other

## 2020-10-09 VITALS — BP 151/67 | HR 64 | Temp 97.5°F | Resp 18 | Ht 63.5 in | Wt 130.8 lb

## 2020-10-09 DIAGNOSIS — C50411 Malignant neoplasm of upper-outer quadrant of right female breast: Secondary | ICD-10-CM

## 2020-10-09 DIAGNOSIS — Z5112 Encounter for antineoplastic immunotherapy: Secondary | ICD-10-CM | POA: Diagnosis not present

## 2020-10-09 DIAGNOSIS — Z170 Estrogen receptor positive status [ER+]: Secondary | ICD-10-CM

## 2020-10-09 MED ORDER — ACETAMINOPHEN 325 MG PO TABS: 650.0000 mg | ORAL_TABLET | Freq: Once | ORAL | Status: DC

## 2020-10-09 MED ORDER — TRASTUZUMAB-ANNS CHEMO 150 MG IV SOLR
6.0000 mg/kg | Freq: Once | INTRAVENOUS | Status: AC
  Administered 2020-10-09: 378 mg via INTRAVENOUS
  Filled 2020-10-09: qty 18

## 2020-10-09 MED ORDER — HEPARIN SOD (PORK) LOCK FLUSH 100 UNIT/ML IV SOLN
500.0000 [IU] | Freq: Once | INTRAVENOUS | Status: AC | PRN
  Administered 2020-10-09: 500 [IU]

## 2020-10-09 MED ORDER — SODIUM CHLORIDE 0.9% FLUSH
10.0000 mL | INTRAVENOUS | Status: DC | PRN
  Administered 2020-10-09: 10 mL

## 2020-10-09 MED ORDER — SODIUM CHLORIDE 0.9 % IV SOLN: Freq: Once | INTRAVENOUS | Status: AC

## 2020-10-09 MED ORDER — DIPHENHYDRAMINE HCL 25 MG PO CAPS: 50.0000 mg | ORAL_CAPSULE | Freq: Once | ORAL | Status: DC

## 2020-10-09 NOTE — Patient Instructions (Signed)
[facility] [place]  Discharge Instructions: Thank you for choosing [facility] to provide your oncology and hematology care.  If you have a lab appointment with the Cancer Center, please go directly to the Cancer Center and check in at the registration area.   Wear comfortable clothing and clothing appropriate for easy access to any Portacath or PICC line.   We strive to give you quality time with your provider. You may need to reschedule your appointment if you arrive late (15 or more minutes).  Arriving late affects you and other patients whose appointments are after yours.  Also, if you miss three or more appointments without notifying the office, you may be dismissed from the clinic at the provider's discretion.      For prescription refill requests, have your pharmacy contact our office and allow 72 hours for refills to be completed.    Today you received the following chemotherapy and/or immunotherapy agents Trastuzumab     To help prevent nausea and vomiting after your treatment, we encourage you to take your nausea medication as directed.  BELOW ARE SYMPTOMS THAT SHOULD BE REPORTED IMMEDIATELY: *FEVER GREATER THAN 100.4 F (38 C) OR HIGHER *CHILLS OR SWEATING *NAUSEA AND VOMITING THAT IS NOT CONTROLLED WITH YOUR NAUSEA MEDICATION *UNUSUAL SHORTNESS OF BREATH *UNUSUAL BRUISING OR BLEEDING *URINARY PROBLEMS (pain or burning when urinating, or frequent urination) *BOWEL PROBLEMS (unusual diarrhea, constipation, pain near the anus) TENDERNESS IN MOUTH AND THROAT WITH OR WITHOUT PRESENCE OF ULCERS (sore throat, sores in mouth, or a toothache) UNUSUAL RASH, SWELLING OR PAIN  UNUSUAL VAGINAL DISCHARGE OR ITCHING   Items with * indicate a potential emergency and should be followed up as soon as possible or go to the Emergency Department if any problems should occur.  Please show the CHEMOTHERAPY ALERT CARD or IMMUNOTHERAPY ALERT CARD at check-in to the  Emergency Department and triage nurse.  Should you have questions after your visit or need to cancel or reschedule your appointment, please contact [facility] [place]  Dept: [phone]  and follow the prompts.  Office hours are 8:00 a.m. to 4:30 p.m. Monday - Friday. Please note that voicemails left after 4:00 p.m. may not be returned until the following business day.  We are closed weekends and major holidays. You have access to a nurse at all times for urgent questions. Please call the main number to the clinic Dept: [phone] and follow the prompts.  For any non-urgent questions, you may also contact your provider using MyChart. We now offer e-Visits for anyone 18 and older to request care online for non-urgent symptoms. For details visit [email or web address].   Also download the MyChart app! Go to the app store, search "MyChart", open the app, select [facility], and log in with your MyChart username and password.  Due to Covid, a mask is required upon entering the hospital/clinic. If you do not have a mask, one will be given to you upon arrival. For doctor visits, patients may have 1 support person aged 18 or older with them. For treatment visits, patients cannot have anyone with them due to current Covid guidelines and our immunocompromised population.    

## 2020-10-09 NOTE — Progress Notes (Signed)
1023: PT STABLE AT TIME OF DISCHARGE

## 2020-10-10 LAB — PTH, INTACT AND CALCIUM
Calcium, Total (PTH): 11.1 mg/dL — ABNORMAL HIGH (ref 8.7–10.3)
PTH: 17 pg/mL (ref 15–65)

## 2020-10-12 ENCOUNTER — Encounter: Payer: Self-pay | Admitting: Oncology

## 2020-10-22 NOTE — Progress Notes (Signed)
[facility]  [address] [place],  [place]  [id number] [phone]  Clinic Day:  10/29/2020  Referring physician: Guadalupe Maple, MD  This document serves as a record of services personally performed by Hosie Poisson, MD. It was created on their behalf by [facility], a trained medical scribe. The creation of this record is based on the scribe's personal observations and the provider's statements to them.  CHIEF COMPLAINT:  CC:  Clinical stage IB hormone and HER2 receptor positive breast cancer status post lumpectomy  Current Treatment:   Adjuvant HER2 targeted therapy  HISTORY OF PRESENT ILLNESS:  Brandy Jacobs is a 87 y.o. 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 She presented for annual bilateral mammogram and had a palpable upper right breast mass, so underwent diagnostic bilateral mammogram and ultrasound.  There was a 2.9 cm upper right breast mass highly suspicious for malignancy.  No abnormal right axillary lymph nodes were seen in left breast did not reveal any evidence of malignancy. 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 Estrogen and progesterone receptors were positive and HER2 positive.  Ki67 was 40%.  Due to the HER2 positivity, we recommended neoadjuvant therapy with trastuzumab, pertuzumab and weekly paclitaxel weekly for 3 cycles. Echocardiogram in December revealed overall normal left ventricular size and function with an ejection fraction of 60 to 65%. She received her first cycle of neoadjuvant trastuzumab/pertuzumab /paclitaxel in January. She underwent an additional stereotactic biopsy of an area in the inferior right breast later in January to guide surgery.  Pathology revealed atypical ductal hyperplasia.  She had difficulty tolerating therapy, mainly due to diarrhea, causing  dehydration and hypokalemia, but she also had cytopenias. She completed neoadjuvant therapy in March.   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  She was then referred back to Dr. Lilia Pro for definitive surgery.  She underwent lumpectomy on April 11th.  Pathology revealed a residual 2 mm, grade 2, invasive ductal carcinoma with foci of ductal carcinoma in situ and atypical lobular hyperplasia.  Estrogen and progesterone receptors were positive and HER2 positive.  Ki-67 was less than 5%.  At her visit at Dr. Deborra Medina office, she was mildly anemic postoperatively. As she did have a small residual cancer, adjuvant HER2 targeted therapy with Kadcyla (ado-trastuzumab emtansine) is recommended instead, for a total of a year of HER2 targeted therapy.  She is also recommended for adjuvant hormonal therapy for 5 years.  She has had falls.  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  She has had difficulty with memory, as well as expression since her stroke, which her daughter feels may have increased since chemotherapy and surgery.  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  Echocardiogram on May 18th revealed normal left ventricular size and function with an ejection fraction between 55 to 60%.  Bone density scan revealed osteoporosis with a T-score of -3.8 in the femur, previously -2 and a T-score of -3.6 in the forearm.  She was placed on Kadcyla adjuvant chemotherapy at the end of May and initially tolerated it fairly well.  She states she has had worsening neuropathy with tingling and pain of the bilateral  feet and legs.  It was decided to stop the Kadcyla due to increasing toxicities.  The patient declines hormonal therapy or treatment of her osteoporosis due to toxicities and her  advanced age and comorbidities. She was placed on single agent trastuzumab every 3 weeks and received her 1st dose on August 25th, and we plan to complete a full year of trastuzumab.  ECHO from August 23rd revealed a normal EF between 55 - 60 %.   INTERVAL HISTORY:  Brandy Jacobs is here for routine follow up prior to her 3rd cycle of trastuzumab. She states that she has been doing well. Her hemoglobin is mildly low at 11.8, and white count and platelets are normal. Chemistries are unremarkable except for a calcium of 10.8, improved, and a total bilirubin of 1.4, improved. She reminds me that she has had chronic hypercalcemia for years and has seen a specialist for this. We will not pursue this aggressively. Her  appetite is good, and she has gained 5 and 1/2 pounds since her last visit.  She denies fever, chills or other signs of infection.  She denies nausea, vomiting, bowel issues, or abdominal pain.  She denies sore throat, cough, dyspnea, or chest pain.  REVIEW OF SYSTEMS:  Review of Systems  Constitutional: Negative.  Negative for appetite change, chills, fatigue, fever and unexpected weight change.  HENT:  Negative.    Eyes: Negative.   Respiratory: Negative.  Negative for chest tightness, cough, hemoptysis, shortness of breath and wheezing.   Cardiovascular: Negative.  Negative for chest pain, leg swelling and palpitations.  Gastrointestinal: Negative.  Negative for abdominal distention, abdominal pain, blood in stool, constipation, diarrhea, nausea and vomiting.  Endocrine: Negative.   Genitourinary: Negative.  Negative for difficulty urinating, dysuria, frequency and hematuria.   Musculoskeletal: Negative.  Negative for arthralgias, back pain, flank pain, gait problem and myalgias.  Skin: Negative.   Neurological: Negative.  Negative for dizziness, extremity weakness, gait problem, headaches, light-headedness, numbness, seizures and speech difficulty.  Hematological: Negative.    Psychiatric/Behavioral: Negative.  Negative for depression and sleep disturbance. The patient is not nervous/anxious.    VITALS:  Blood pressure (!) 153/70, pulse (!) 57, temperature 97.6 F (36.4 C), temperature source Oral, resp. rate 18, height 5' 3.5" (1.613 m), weight 135 lb 8 oz (61.5 kg), SpO2 97 %.  Wt Readings from Last 3 Encounters:  10/29/20 135 lb 8 oz (61.5 kg)  10/09/20 130 lb 12 oz (59.3 kg)  10/08/20 134 lb 6.4 oz (61 kg)    Body mass index is 23.63 kg/m.  Performance status (ECOG): 0 - Asymptomatic  PHYSICAL EXAM:  Physical Exam Constitutional:      General: She is not in acute distress.    Appearance: Normal appearance. She is normal weight.  HENT:     Head: Normocephalic and atraumatic.  Eyes:     General: No scleral icterus.    Extraocular Movements: Extraocular movements intact.     Conjunctiva/sclera: Conjunctivae normal.     Pupils: Pupils are equal, round, and reactive to light.  Cardiovascular:     Rate and Rhythm: Regular rhythm. Bradycardia present.     Pulses: Normal pulses.     Heart sounds: Normal heart sounds. No murmur heard.   No friction rub. No gallop.  Pulmonary:     Effort: Pulmonary effort is normal. No respiratory distress.     Breath sounds: Normal breath sounds.  Chest:     Comments: Breasts are without masses Abdominal:  General: Bowel sounds are normal. There is no distension.     Palpations: Abdomen is soft. There is no hepatomegaly, splenomegaly or mass.     Tenderness: There is no abdominal tenderness.  Musculoskeletal:        General: Normal range of motion.     Cervical back: Normal range of motion and neck supple.     Right lower leg: No edema.     Left lower leg: No edema.  Lymphadenopathy:     Cervical: No cervical adenopathy.  Skin:    General: Skin is warm and dry.  Neurological:     General: No focal deficit present.     Mental Status: She is alert and oriented to person, place, and time. Mental status is  at baseline.  Psychiatric:        Mood and Affect: Mood normal.        Behavior: Behavior normal.        Thought Content: Thought content normal.        Judgment: Judgment normal.   LABS:   CBC Latest Ref Rng & Units 10/29/2020 10/08/2020 09/08/2020  WBC - 5.4 4.6 4.1  Hemoglobin 12.0 - 16.0 11.8(A) 12.1 12.0  Hematocrit 36 - 46 36 37 36  Platelets 150 - 399 165 167 159   CMP Latest Ref Rng & Units 10/29/2020 10/08/2020 10/08/2020  BUN 4 - '21 20 17 ' -  Creatinine 0.5 - 1.1 0.8 0.9 -  Sodium 137 - 147 139 141 -  Potassium 3.4 - 5.3 4.3 4.0 -  Chloride 99 - 108 104 103 -  CO2 13 - 22 27(A) 29(A) -  Calcium 8.7 - 10.7 10.8(A) 11.1(H) 11.0(A)  Total Protein 6.3 - 8.2 g/dL - - -  Alkaline Phos 25 - 125 67 61 -  AST 13 - 35 48(A) 51(A) -  ALT 7 - 35 33 37(A) -    STUDIES:  No results found.    HISTORY:   Allergies: No Known Allergies  Current Medications: Current Outpatient Medications  Medication Sig Dispense Refill   acetaminophen (TYLENOL) 500 MG tablet Take 500 mg by mouth at bedtime.     aspirin EC 81 MG tablet Take 81 mg by mouth daily. Swallow whole.     atorvastatin (LIPITOR) 40 MG tablet Take 40 mg by mouth daily.     dexamethasone (DECADRON) 4 MG tablet Take 1 tablet (4 mg total) by mouth as directed. 3 pills night before and 3 pills morning of first chemo, then 1 pill night before and morning of weekly chemo 30 tablet 2   diphenhydrAMINE (BENADRYL) 25 mg capsule Take 25 mg by mouth at bedtime as needed. At night as needed for itching     Loperamide HCl (IMODIUM PO) Take by mouth. Over the counter imodium, patient is taking     losartan (COZAAR) 50 MG tablet Take 50 mg by mouth daily.     omeprazole (PRILOSEC) 40 MG capsule Take 1 capsule (40 mg total) by mouth daily. 30 capsule 3   ondansetron (ZOFRAN) 4 MG tablet Take 1 tablet (4 mg total) by mouth every 4 (four) hours as needed for nausea or vomiting. 30 tablet 5   oxyCODONE (OXY IR/ROXICODONE) 5 MG immediate release  tablet SMARTSIG:1 Tablet(s) By Mouth 4-5 Times Daily     predniSONE (DELTASONE) 5 MG tablet Take 2.5 mg by mouth 2 (two) times a week. TAKES 1/2 TABLET OF 5MG ONCE A WEEK     prochlorperazine (COMPAZINE) 10 MG  tablet Take 1 tablet (10 mg total) by mouth every 6 (six) hours as needed for nausea or vomiting. 30 tablet 5   Vitamin D3 (VITAMIN D) 25 MCG tablet Take 1,000 Units by mouth daily.     No current facility-administered medications for this visit.    ASSESSMENT & PLAN:   Assessment: 1. 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  She had an excellent response to this, with just a tiny residual invasive carcinoma. She received 4 cycles of adjuvant Kadcyla and this was discontinued due to toxicities.   She has started single agent trastuzumab and is tolerating well.  Since she has already had pre- and post-op treatments, she would be due to take 11 more doses to complete 1 year.  The patient and her daughter state that after much discussion, she does not wish to take adjuvant hormonal therapy.  2. Mildly decreased left ventricular ejection fraction from previous echocardiogram, improved.  She will still need periodic echocardiograms if she remains on trastuzumab. She will be due for repeat ECHO in late November.  3. Osteoporosis, she is taking vitamin-D, but not calcium. I had started her on calcium and I recommended placing her on Prolia, but she has poor dentition. She then developed mild hypercalcemia, so calcium was discontinued.  She has decided against treatment for the osteoporosis.  4. 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 I advised her to avoid Tylenol, she does not drink alcohol.  5. Hypercalcemia, improved and long standing.  PTH was normal. We will continue to monitor this.  6. 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  Hopefully, this will improve with discontinuation of Kadcyla.  If not, we could consider trying gabapentin.  Plan:    She will proceed with a 3rd cycle of trastuzumab tomorrow. The patient has opted against endocrine therapy due to the potential side effects, and has declined treatment for her osteoporosis. We will see her back in 3 weeks with CBC and CMP prior to her next cycle.  The goal is to complete a full year of the trastuzumab and she already had neoadjuvant and adjuvant for 4 cycles. The patient and her daughter understand the plans discussed today and are in agreement with them.  They know to contact our office if she develops issues prior to her next appointment.   Derwood Kaplan, MD [facility] [place], am acting as scribe for Derwood Kaplan, MD  I have reviewed this report as typed by the medical scribe, and it is complete and accurate.

## 2020-10-29 ENCOUNTER — Ambulatory Visit: Payer: Medicare Other | Admitting: Oncology

## 2020-10-29 ENCOUNTER — Encounter: Payer: Self-pay | Admitting: Oncology

## 2020-10-29 ENCOUNTER — Other Ambulatory Visit: Payer: Self-pay | Admitting: Hematology and Oncology

## 2020-10-29 ENCOUNTER — Other Ambulatory Visit: Payer: Medicare Other

## 2020-10-29 ENCOUNTER — Other Ambulatory Visit: Payer: Self-pay

## 2020-10-29 ENCOUNTER — Inpatient Hospital Stay: Payer: Medicare Other | Attending: Oncology

## 2020-10-29 ENCOUNTER — Inpatient Hospital Stay (INDEPENDENT_AMBULATORY_CARE_PROVIDER_SITE_OTHER): Payer: Medicare Other | Admitting: Oncology

## 2020-10-29 VITALS — BP 153/70 | HR 57 | Temp 97.6°F | Resp 18 | Ht 63.5 in | Wt 135.5 lb

## 2020-10-29 DIAGNOSIS — Z5112 Encounter for antineoplastic immunotherapy: Secondary | ICD-10-CM | POA: Insufficient documentation

## 2020-10-29 DIAGNOSIS — M810 Age-related osteoporosis without current pathological fracture: Secondary | ICD-10-CM | POA: Insufficient documentation

## 2020-10-29 DIAGNOSIS — C50011 Malignant neoplasm of nipple and areola, right female breast: Secondary | ICD-10-CM

## 2020-10-29 DIAGNOSIS — Z170 Estrogen receptor positive status [ER+]: Secondary | ICD-10-CM | POA: Insufficient documentation

## 2020-10-29 DIAGNOSIS — T451X5A Adverse effect of antineoplastic and immunosuppressive drugs, initial encounter: Secondary | ICD-10-CM

## 2020-10-29 DIAGNOSIS — Z23 Encounter for immunization: Secondary | ICD-10-CM | POA: Insufficient documentation

## 2020-10-29 DIAGNOSIS — C50411 Malignant neoplasm of upper-outer quadrant of right female breast: Secondary | ICD-10-CM

## 2020-10-29 DIAGNOSIS — C50911 Malignant neoplasm of unspecified site of right female breast: Secondary | ICD-10-CM | POA: Insufficient documentation

## 2020-10-29 DIAGNOSIS — Z79899 Other long term (current) drug therapy: Secondary | ICD-10-CM | POA: Insufficient documentation

## 2020-10-29 DIAGNOSIS — G620 Drug-induced polyneuropathy: Secondary | ICD-10-CM

## 2020-10-29 DIAGNOSIS — G629 Polyneuropathy, unspecified: Secondary | ICD-10-CM | POA: Insufficient documentation

## 2020-10-29 LAB — MAGNESIUM: Magnesium: 1.4

## 2020-10-29 LAB — CBC AND DIFFERENTIAL
HCT: 36 (ref 36–46)
Hemoglobin: 11.8 — AB (ref 12.0–16.0)
Neutrophils Absolute: 2.97
Platelets: 165 (ref 150–399)
WBC: 5.4

## 2020-10-29 LAB — CBC: RBC: 3.87 (ref 3.87–5.11)

## 2020-10-29 LAB — BASIC METABOLIC PANEL
BUN: 20 (ref 4–21)
CO2: 27 — AB (ref 13–22)
Chloride: 104 (ref 99–108)
Creatinine: 0.8 (ref 0.5–1.1)
Glucose: 103
Potassium: 4.3 (ref 3.4–5.3)
Sodium: 139 (ref 137–147)

## 2020-10-29 LAB — HEPATIC FUNCTION PANEL
ALT: 33 (ref 7–35)
AST: 48 — AB (ref 13–35)
Alkaline Phosphatase: 67 (ref 25–125)
Bilirubin, Total: 1.4

## 2020-10-29 LAB — COMPREHENSIVE METABOLIC PANEL
Albumin: 4.2 (ref 3.5–5.0)
Calcium: 10.8 — AB (ref 8.7–10.7)

## 2020-10-30 ENCOUNTER — Inpatient Hospital Stay: Payer: Medicare Other

## 2020-10-30 ENCOUNTER — Other Ambulatory Visit: Payer: Self-pay

## 2020-10-30 VITALS — BP 143/53 | HR 58 | Temp 98.3°F | Resp 20 | Ht 63.5 in | Wt 133.0 lb

## 2020-10-30 DIAGNOSIS — Z79899 Other long term (current) drug therapy: Secondary | ICD-10-CM | POA: Diagnosis not present

## 2020-10-30 DIAGNOSIS — C50411 Malignant neoplasm of upper-outer quadrant of right female breast: Secondary | ICD-10-CM

## 2020-10-30 DIAGNOSIS — Z5112 Encounter for antineoplastic immunotherapy: Secondary | ICD-10-CM | POA: Diagnosis not present

## 2020-10-30 DIAGNOSIS — G629 Polyneuropathy, unspecified: Secondary | ICD-10-CM | POA: Diagnosis not present

## 2020-10-30 DIAGNOSIS — Z170 Estrogen receptor positive status [ER+]: Secondary | ICD-10-CM

## 2020-10-30 DIAGNOSIS — M810 Age-related osteoporosis without current pathological fracture: Secondary | ICD-10-CM | POA: Diagnosis not present

## 2020-10-30 DIAGNOSIS — Z23 Encounter for immunization: Secondary | ICD-10-CM | POA: Diagnosis not present

## 2020-10-30 DIAGNOSIS — C50911 Malignant neoplasm of unspecified site of right female breast: Secondary | ICD-10-CM | POA: Diagnosis present

## 2020-10-30 MED ORDER — HEPARIN SOD (PORK) LOCK FLUSH 100 UNIT/ML IV SOLN
500.0000 [IU] | Freq: Once | INTRAVENOUS | Status: AC | PRN
  Administered 2020-10-30: 500 [IU]

## 2020-10-30 MED ORDER — ACETAMINOPHEN 325 MG PO TABS: 650.0000 mg | ORAL_TABLET | Freq: Once | ORAL | Status: DC

## 2020-10-30 MED ORDER — SODIUM CHLORIDE 0.9 % IV SOLN: Freq: Once | INTRAVENOUS | Status: AC

## 2020-10-30 MED ORDER — TRASTUZUMAB-ANNS CHEMO 150 MG IV SOLR
6.0000 mg/kg | Freq: Once | INTRAVENOUS | Status: AC
  Administered 2020-10-30: 357 mg via INTRAVENOUS
  Filled 2020-10-30: qty 17

## 2020-10-30 MED ORDER — SODIUM CHLORIDE 0.9% FLUSH
10.0000 mL | INTRAVENOUS | Status: DC | PRN
  Administered 2020-10-30: 10 mL

## 2020-10-30 MED ORDER — DIPHENHYDRAMINE HCL 25 MG PO CAPS: 50.0000 mg | ORAL_CAPSULE | Freq: Once | ORAL | Status: DC

## 2020-10-30 NOTE — Progress Notes (Signed)
Discharged home, with daughter- Stable at discharge

## 2020-10-30 NOTE — Patient Instructions (Signed)
[facility]  Discharge Instructions: Thank you for choosing [facility] to provide your oncology and hematology care.  If you have a lab appointment with the [facility], please go directly to the [facility] and check in at the registration area.   Wear comfortable clothing and clothing appropriate for easy access to any Portacath or PICC line.   We strive to give you quality time with your provider. You may need to reschedule your appointment if you arrive late (15 or more minutes).  Arriving late affects you and other patients whose appointments are after yours.  Also, if you miss three or more appointments without notifying the office, you may be dismissed from the clinic at the provider's discretion.      For prescription refill requests, have your pharmacy contact our office and allow 72 hours for refills to be completed.    Today you received the following chemotherapy and/or immunotherapy agents:KanjintiTrastuzumab injection for infusion What is this medication? TRASTUZUMAB (tras TOO zoo mab) is a monoclonal antibody. It is used to treat breast cancer and stomach cancer. This medicine may be used for other purposes; ask your health care provider or pharmacist if you have questions. COMMON BRAND NAME(S): Herceptin, Janae Bridgeman, Ontruzant, Trazimera What should I tell my care team before I take this medication? They need to know if you have any of these conditions: heart disease heart failure lung or breathing disease, like asthma an unusual or allergic reaction to trastuzumab, benzyl alcohol, or other medications, foods, dyes, or preservatives pregnant or trying to get pregnant breast-feeding How should I use this medication? This drug is given as an infusion into a vein. It is administered in a hospital or clinic by a specially trained health care professional. Talk to your pediatrician regarding the use of this medicine in  children. This medicine is not approved for use in children. Overdosage: If you think you have taken too much of this medicine contact a poison control center or emergency room at once. NOTE: This medicine is only for you. Do not share this medicine with others. What if I miss a dose? It is important not to miss a dose. Call your doctor or health care professional if you are unable to keep an appointment. What may interact with this medication? This medicine may interact with the following medications: certain types of chemotherapy, such as daunorubicin, doxorubicin, epirubicin, and idarubicin This list may not describe all possible interactions. Give your health care provider a list of all the medicines, herbs, non-prescription drugs, or dietary supplements you use. Also tell them if you smoke, drink alcohol, or use illegal drugs. Some items may interact with your medicine. What should I watch for while using this medication? Visit your doctor for checks on your progress. Report any side effects. Continue your course of treatment even though you feel ill unless your doctor tells you to stop. Call your doctor or health care professional for advice if you get a fever, chills or sore throat, or other symptoms of a cold or flu. Do not treat yourself. Try to avoid being around people who are sick. You may experience fever, chills and shaking during your first infusion. These effects are usually mild and can be treated with other medicines. Report any side effects during the infusion to your health care professional. Fever and chills usually do not happen with later infusions. Do not become pregnant while taking this medicine or for 7 months after  stopping it. Women should inform their doctor if they wish to become pregnant or think they might be pregnant. Women of child-bearing potential will need to have a negative pregnancy test before starting this medicine. There is a potential for serious side effects  to an unborn child. Talk to your health care professional or pharmacist for more information. Do not breast-feed an infant while taking this medicine or for 7 months after stopping it. Women must use effective birth control with this medicine. What side effects may I notice from receiving this medication? Side effects that you should report to your doctor or health care professional as soon as possible: allergic reactions like skin rash, itching or hives, swelling of the face, lips, or tongue chest pain or palpitations cough dizziness feeling faint or lightheaded, falls fever general ill feeling or flu-like symptoms signs of worsening heart failure like breathing problems; swelling in your legs and feet unusually weak or tired Side effects that usually do not require medical attention (report to your doctor or health care professional if they continue or are bothersome): bone pain changes in taste diarrhea joint pain nausea/vomiting weight loss This list may not describe all possible side effects. Call your doctor for medical advice about side effects. You may report side effects to FDA at 1-800-FDA-1088. Where should I keep my medication? This drug is given in a hospital or clinic and will not be stored at home. NOTE: This sheet is a summary. It may not cover all possible information. If you have questions about this medicine, talk to your doctor, pharmacist, or health care provider.  2022 Elsevier/Gold Standard (2016-01-05 14:37:52)    To help prevent nausea and vomiting after your treatment, we encourage you to take your nausea medication as directed.  BELOW ARE SYMPTOMS THAT SHOULD BE REPORTED IMMEDIATELY: *FEVER GREATER THAN 100.4 F (38 C) OR HIGHER *CHILLS OR SWEATING *NAUSEA AND VOMITING THAT IS NOT CONTROLLED WITH YOUR NAUSEA MEDICATION *UNUSUAL SHORTNESS OF BREATH *UNUSUAL BRUISING OR BLEEDING *URINARY PROBLEMS (pain or burning when urinating, or frequent  urination) *BOWEL PROBLEMS (unusual diarrhea, constipation, pain near the anus) TENDERNESS IN MOUTH AND THROAT WITH OR WITHOUT PRESENCE OF ULCERS (sore throat, sores in mouth, or a toothache) UNUSUAL RASH, SWELLING OR PAIN  UNUSUAL VAGINAL DISCHARGE OR ITCHING   Items with * indicate a potential emergency and should be followed up as soon as possible or go to the Emergency Department if any problems should occur.  Please show the CHEMOTHERAPY ALERT CARD or IMMUNOTHERAPY ALERT CARD at check-in to the Emergency Department and triage nurse.  Should you have questions after your visit or need to cancel or reschedule your appointment, please contact [facility]  Dept: [phone]  and follow the prompts.  Office hours are 8:00 a.m. to 4:30 p.m. Monday - Friday. Please note that voicemails left after 4:00 p.m. may not be returned until the following business day.  We are closed weekends and major holidays. You have access to a nurse at all times for urgent questions. Please call the main number to the clinic Dept: [phone] and follow the prompts.  For any non-urgent questions, you may also contact your provider using MyChart. We now offer e-Visits for anyone 14 and older to request care online for non-urgent symptoms. For details visit [email or web address].   Also download the MyChart app! Go to the app store, search "MyChart", open the app, select [facility], and log in with your MyChart username and password.  Due  to Covid, a mask is required upon entering the hospital/clinic. If you do not have a mask, one will be given to you upon arrival. For doctor visits, patients may have 1 support person aged 50 or older with them. For treatment visits, patients cannot have anyone with them due to current Covid guidelines and our immunocompromised population.

## 2020-11-03 ENCOUNTER — Encounter: Payer: Self-pay | Admitting: Oncology

## 2020-11-03 DIAGNOSIS — G620 Drug-induced polyneuropathy: Secondary | ICD-10-CM | POA: Insufficient documentation

## 2020-11-06 ENCOUNTER — Encounter: Payer: Self-pay | Admitting: Oncology

## 2020-11-13 NOTE — Progress Notes (Signed)
[facility]  [address] [place],  [place]  [address] [phone]  Clinic Day:  11/19/2020  Referring physician: Guadalupe Maple, MD  This document serves as a record of services personally performed by Hosie Poisson, MD. It was created on their behalf by [facility] E, a trained medical scribe. The creation of this record is based on the scribe's personal observations and the provider's statements to them.  CHIEF COMPLAINT:  CC:  Clinical stage IB hormone and HER2 receptor positive breast cancer status post lumpectomy  Current Treatment:   Adjuvant HER2 targeted therapy  HISTORY OF PRESENT ILLNESS:  Brandy Jacobs is a 87 y.o. 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 She had a palpable upper right breast mass, so underwent diagnostic imaging. There was a 2.9 cm upper right breast mass highly suspicious for malignancy.  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 Estrogen and progesterone receptors were positive and HER2 positive.  Ki67 was 40%.  Due to the HER2 positivity, we recommended neoadjuvant therapy with trastuzumab, pertuzumab and weekly paclitaxel weekly for 3 cycles. Echocardiogram in December revealed overall normal left ventricular size and function with an ejection fraction of 60 to 65%. She received her first cycle of neoadjuvant trastuzumab/pertuzumab /paclitaxel in January. She underwent an additional stereotactic biopsy of an area in the inferior right breast later in January to guide surgery.  Pathology revealed atypical ductal hyperplasia.  She had difficulty tolerating therapy, mainly due to diarrhea, causing dehydration and hypokalemia, but she also had cytopenias. She completed neoadjuvant therapy in March.   She developed neuropathy from the paclitaxel, so.we skipped the last 2  doses of weekly paclitaxel due to toxicities.  She was then referred back to Dr. Lilia Pro and underwent lumpectomy on April 11th.  Pathology revealed a residual 2 mm, grade 2, invasive ductal carcinoma with foci of ductal carcinoma in situ and atypical lobular hyperplasia.  Estrogen and progesterone receptors were positive and HER2 positive.  Ki-67 was less than 5%.  As she did have a small residual cancer, adjuvant HER2 targeted therapy with Kadcyla (ado-trastuzumab emtansine) was recommended, for a total of a year of HER2 targeted therapy. 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  She has had difficulty with memory, as well as expression since her stroke, which her daughter feels may have increased since chemotherapy and surgery.  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  Echocardiogram on May 18th revealed normal left ventricular size and function with an ejection fraction between 55 to 60%.  Bone density scan revealed osteoporosis with a T-score of -3.8 in the femur, previously -2 and a T-score of -3.6 in the forearm.  She was placed on Kadcyla adjuvant chemotherapy at the end of May and initially tolerated it fairly well.  She states she has had worsening neuropathy with tingling and pain of the bilateral feet and legs.  It was decided to stop the Kadcyla due to increasing toxicities.  The patient declines hormonal therapy or treatment of her osteoporosis due to toxicities and her advanced age and comorbidities. She was placed on single agent trastuzumab every 3 weeks and received her 1st dose on August 25th, and we plan to complete a full year of trastuzumab.  ECHO from August 23rd revealed a normal EF between 55 - 60 %. She has chronic hypercalcemia.  INTERVAL HISTORY:  Brandy Jacobs is here for routine follow up prior to a 4th cycle of trastuzumab. She  states that she has been well, and remains fairly stable. She denies complaints today. She continues to use her walker. Blood counts and chemistries are unremarkable except for a BUN of 23, a calcium of 10.9, stable, and an SGOT of 41, improved. Her  appetite is good, and she has lost 1 pound since her last visit.  She denies fever, chills or other signs of infection.  She denies nausea, vomiting, bowel issues, or abdominal pain.  She denies sore throat, cough, dyspnea, or chest pain.  REVIEW OF SYSTEMS:  Review of Systems  Constitutional: Negative.  Negative for appetite change, chills, fatigue, fever and unexpected weight change.  HENT:  Negative.    Eyes: Negative.   Respiratory: Negative.  Negative for chest tightness, cough, hemoptysis, shortness of breath and wheezing.   Cardiovascular: Negative.  Negative for chest pain, leg swelling and palpitations.  Gastrointestinal: Negative.  Negative for abdominal distention, abdominal pain, blood in stool, constipation, diarrhea, nausea and vomiting.  Endocrine: Negative.   Genitourinary: Negative.  Negative for difficulty urinating, dysuria, frequency and hematuria.   Musculoskeletal:  Positive for gait problem (uses a walker to ambulate). Negative for arthralgias, back pain, flank pain and myalgias.  Skin: Negative.   Neurological:  Positive for gait problem (uses a walker to ambulate). Negative for dizziness, extremity weakness, headaches, light-headedness, numbness, seizures and speech difficulty.  Hematological: Negative.   Psychiatric/Behavioral: Negative.  Negative for depression and sleep disturbance. The patient is not nervous/anxious.    VITALS:  Blood pressure 133/60, pulse (!) 57, temperature 98 F (36.7 C), temperature source Oral, resp. rate 18, height 5' 3.5" (1.613 m), weight 133 lb 14.4 oz (60.7 kg), SpO2 96 %.  Wt Readings from Last 3 Encounters:  11/19/20 133 lb 14.4 oz (60.7 kg)  10/30/20 133 lb (60.3 kg)  10/29/20 135 lb 8  oz (61.5 kg)    Body mass index is 23.35 kg/m.  Performance status (ECOG): 0 - Asymptomatic  PHYSICAL EXAM:  Physical Exam Constitutional:      General: She is not in acute distress.    Appearance: Normal appearance. She is normal weight.  HENT:     Head: Normocephalic and atraumatic.  Eyes:     General: No scleral icterus.    Extraocular Movements: Extraocular movements intact.     Conjunctiva/sclera: Conjunctivae normal.     Pupils: Pupils are equal, round, and reactive to light.  Cardiovascular:     Rate and Rhythm: Regular rhythm. Bradycardia present.     Pulses: Normal pulses.     Heart sounds: Normal heart sounds. No murmur heard.   No friction rub. No gallop.  Pulmonary:     Effort: Pulmonary effort is normal. No respiratory distress.     Breath sounds: Normal breath sounds.  Chest:  Breasts:    Right: Normal.     Left: Normal.     Comments: Breasts are without masses. Well healed scar in the upper outer quadrant of the right breast. Abdominal:     General: Bowel sounds are normal. There is no distension.     Palpations: Abdomen is soft. There is no hepatomegaly, splenomegaly or mass.     Tenderness: There is no abdominal tenderness.  Musculoskeletal:        General: Normal range of motion.  Cervical back: Normal range of motion and neck supple.     Right lower leg: No edema.     Left lower leg: No edema.  Lymphadenopathy:     Cervical: No cervical adenopathy.  Skin:    General: Skin is warm and dry.  Neurological:     General: No focal deficit present.     Mental Status: She is alert and oriented to person, place, and time. Mental status is at baseline.  Psychiatric:        Mood and Affect: Mood normal.        Behavior: Behavior normal.        Thought Content: Thought content normal.        Judgment: Judgment normal.   LABS:   CBC Latest Ref Rng & Units 11/19/2020 10/29/2020 10/08/2020  WBC - 5.7 5.4 4.6  Hemoglobin 12.0 - 16.0 12.1 11.8(A) 12.1   Hematocrit 36 - 46 37 36 37  Platelets 150 - 399 172 165 167   CMP Latest Ref Rng & Units 11/19/2020 10/29/2020 10/08/2020  BUN 4 - 21 23(A) 20 17  Creatinine 0.5 - 1.1 0.8 0.8 0.9  Sodium 137 - 147 141 139 141  Potassium 3.4 - 5.3 4.6 4.3 4.0  Chloride 99 - 108 107 104 103  CO2 13 - 22 27(A) 27(A) 29(A)  Calcium 8.7 - 10.7 10.9(A) 10.8(A) 11.1(H)  Total Protein 6.3 - 8.2 g/dL - - -  Alkaline Phos 25 - 125 80 67 61  AST 13 - 35 41(A) 48(A) 51(A)  ALT 7 - 35 29 33 37(A)    STUDIES:  No results found.    HISTORY:   Allergies: No Known Allergies  Current Medications: Current Outpatient Medications  Medication Sig Dispense Refill   acetaminophen (TYLENOL) 500 MG tablet Take 500 mg by mouth at bedtime.     aspirin EC 81 MG tablet Take 81 mg by mouth daily. Swallow whole.     atorvastatin (LIPITOR) 40 MG tablet Take 40 mg by mouth daily.     dexamethasone (DECADRON) 4 MG tablet Take 1 tablet (4 mg total) by mouth as directed. 3 pills night before and 3 pills morning of first chemo, then 1 pill night before and morning of weekly chemo 30 tablet 2   diphenhydrAMINE (BENADRYL) 25 mg capsule Take 25 mg by mouth at bedtime as needed. At night as needed for itching     Loperamide HCl (IMODIUM PO) Take by mouth. Over the counter imodium, patient is taking     losartan (COZAAR) 50 MG tablet Take 50 mg by mouth daily.     omeprazole (PRILOSEC) 40 MG capsule Take 1 capsule (40 mg total) by mouth daily. 30 capsule 3   ondansetron (ZOFRAN) 4 MG tablet Take 1 tablet (4 mg total) by mouth every 4 (four) hours as needed for nausea or vomiting. 30 tablet 5   oxyCODONE (OXY IR/ROXICODONE) 5 MG immediate release tablet SMARTSIG:1 Tablet(s) By Mouth 4-5 Times Daily     prochlorperazine (COMPAZINE) 10 MG tablet Take 1 tablet (10 mg total) by mouth every 6 (six) hours as needed for nausea or vomiting. 30 tablet 5   Vitamin D3 (VITAMIN D) 25 MCG tablet Take 1,000 Units by mouth daily.     No current  facility-administered medications for this visit.    ASSESSMENT & PLAN:   Assessment: 1. 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  She had an excellent response to this,  with just a tiny residual invasive carcinoma. She received 4 cycles of adjuvant Kadcyla and this was discontinued due to toxicities.   She has started single agent trastuzumab and is tolerating it well.  Since she has already had pre- and post-op treatments, she would be due to take 11 more doses to complete 1 year.  The patient and her daughter state that after much discussion, she does not wish to take adjuvant hormonal therapy.  2. Mildly decreased left ventricular ejection fraction from previous echocardiogram, improved.  She will still need periodic echocardiograms during treatment with trastuzumab. She will be due for repeat ECHO in late November.  3. Osteoporosis, she is taking vitamin-D, but not calcium. I had started her on calcium and I recommended placing her on Prolia, but she has poor dentition. She then developed mild hypercalcemia, so calcium was discontinued.  She has decided against treatment for the osteoporosis.  4. 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 I advised her to avoid Tylenol. She does not drink alcohol.  5. Hypercalcemia, improved and long standing.  PTH was normal. We will continue to monitor this.  6. 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  Hopefully, this will improve with discontinuation of Kadcyla.  If not, we could consider trying gabapentin.  Plan:    She will proceed with a 4th cycle of trastuzumab tomorrow as well as flu vaccine. The patient has opted against endocrine therapy due to the potential side effects, and has declined treatment for her osteoporosis. We will see her back in 3 weeks with CBC and CMP prior to  her next cycle.  The goal is to complete a full year of the trastuzumab and she already had neoadjuvant and adjuvant for 4 cycles. The patient and her daughter understand the plans discussed today and are in agreement with them.  They know to contact our office if she develops issues prior to her next appointment.  I provided 15 minutes of face-to-face time during this this encounter and > 50% was spent counseling as documented under my assessment and plan.    Derwood Kaplan, MD [facility], am acting as scribe for Derwood Kaplan, MD  I have reviewed this report as typed by the medical scribe, and it is complete and accurate.

## 2020-11-19 ENCOUNTER — Other Ambulatory Visit: Payer: Self-pay | Admitting: Hematology and Oncology

## 2020-11-19 ENCOUNTER — Encounter: Payer: Self-pay | Admitting: Oncology

## 2020-11-19 ENCOUNTER — Inpatient Hospital Stay (INDEPENDENT_AMBULATORY_CARE_PROVIDER_SITE_OTHER): Payer: Medicare Other | Admitting: Oncology

## 2020-11-19 ENCOUNTER — Telehealth: Payer: Self-pay | Admitting: Oncology

## 2020-11-19 ENCOUNTER — Other Ambulatory Visit: Payer: Self-pay | Admitting: Oncology

## 2020-11-19 ENCOUNTER — Inpatient Hospital Stay: Payer: Medicare Other

## 2020-11-19 VITALS — BP 133/60 | HR 57 | Temp 98.0°F | Resp 18 | Ht 63.5 in | Wt 133.9 lb

## 2020-11-19 DIAGNOSIS — Z170 Estrogen receptor positive status [ER+]: Secondary | ICD-10-CM | POA: Diagnosis not present

## 2020-11-19 DIAGNOSIS — G620 Drug-induced polyneuropathy: Secondary | ICD-10-CM

## 2020-11-19 DIAGNOSIS — M810 Age-related osteoporosis without current pathological fracture: Secondary | ICD-10-CM | POA: Diagnosis not present

## 2020-11-19 DIAGNOSIS — C50411 Malignant neoplasm of upper-outer quadrant of right female breast: Secondary | ICD-10-CM

## 2020-11-19 DIAGNOSIS — C50011 Malignant neoplasm of nipple and areola, right female breast: Secondary | ICD-10-CM | POA: Diagnosis not present

## 2020-11-19 DIAGNOSIS — T451X5A Adverse effect of antineoplastic and immunosuppressive drugs, initial encounter: Secondary | ICD-10-CM

## 2020-11-19 LAB — BASIC METABOLIC PANEL
BUN: 23 — AB (ref 4–21)
CO2: 27 — AB (ref 13–22)
Chloride: 107 (ref 99–108)
Creatinine: 0.8 (ref 0.5–1.1)
Glucose: 107
Potassium: 4.6 (ref 3.4–5.3)
Sodium: 141 (ref 137–147)

## 2020-11-19 LAB — CBC AND DIFFERENTIAL
HCT: 37 (ref 36–46)
Hemoglobin: 12.1 (ref 12.0–16.0)
Neutrophils Absolute: 3.36
Platelets: 172 (ref 150–399)
WBC: 5.7

## 2020-11-19 LAB — CBC
MCV: 93 (ref 81–99)
RBC: 4 (ref 3.87–5.11)

## 2020-11-19 LAB — COMPREHENSIVE METABOLIC PANEL
Albumin: 4.1 (ref 3.5–5.0)
Calcium: 10.9 — AB (ref 8.7–10.7)

## 2020-11-19 LAB — HEPATIC FUNCTION PANEL
ALT: 29 (ref 7–35)
AST: 41 — AB (ref 13–35)
Alkaline Phosphatase: 80 (ref 25–125)
Bilirubin, Total: 1.3

## 2020-11-19 MED FILL — Trastuzumab-anns For IV Soln 150 MG: INTRAVENOUS | Qty: 17 | Status: AC

## 2020-11-19 NOTE — Telephone Encounter (Signed)
Per 10/27 LOS, patient scheduled for Nov, Dec Labs, Follow Up - 11/22 Echo.  Gave patient Orders/Appt Summary

## 2020-11-20 ENCOUNTER — Inpatient Hospital Stay: Payer: Medicare Other

## 2020-11-20 ENCOUNTER — Other Ambulatory Visit: Payer: Self-pay

## 2020-11-20 VITALS — BP 146/67 | HR 59 | Temp 98.1°F | Resp 18 | Ht 63.5 in | Wt 135.2 lb

## 2020-11-20 DIAGNOSIS — Z5112 Encounter for antineoplastic immunotherapy: Secondary | ICD-10-CM | POA: Diagnosis not present

## 2020-11-20 DIAGNOSIS — C50411 Malignant neoplasm of upper-outer quadrant of right female breast: Secondary | ICD-10-CM

## 2020-11-20 DIAGNOSIS — Z170 Estrogen receptor positive status [ER+]: Secondary | ICD-10-CM

## 2020-11-20 MED ORDER — HEPARIN SOD (PORK) LOCK FLUSH 100 UNIT/ML IV SOLN
500.0000 [IU] | Freq: Once | INTRAVENOUS | Status: AC | PRN
  Administered 2020-11-20: 500 [IU]

## 2020-11-20 MED ORDER — TRASTUZUMAB-ANNS CHEMO 150 MG IV SOLR
6.0000 mg/kg | Freq: Once | INTRAVENOUS | Status: AC
  Administered 2020-11-20: 357 mg via INTRAVENOUS
  Filled 2020-11-20: qty 17

## 2020-11-20 MED ORDER — INFLUENZA VAC SPLIT QUAD 0.5 ML IM SUSY
0.5000 mL | PREFILLED_SYRINGE | Freq: Once | INTRAMUSCULAR | Status: AC
  Administered 2020-11-20: 0.5 mL via INTRAMUSCULAR
  Filled 2020-11-20: qty 0.5

## 2020-11-20 MED ORDER — ACETAMINOPHEN 325 MG PO TABS: 650.0000 mg | ORAL_TABLET | Freq: Once | ORAL | Status: DC

## 2020-11-20 MED ORDER — DIPHENHYDRAMINE HCL 25 MG PO CAPS: 50.0000 mg | ORAL_CAPSULE | Freq: Once | ORAL | Status: DC

## 2020-11-20 MED ORDER — SODIUM CHLORIDE 0.9% FLUSH
10.0000 mL | INTRAVENOUS | Status: DC | PRN
  Administered 2020-11-20: 10 mL

## 2020-11-20 MED ORDER — SODIUM CHLORIDE 0.9 % IV SOLN: Freq: Once | INTRAVENOUS | Status: AC

## 2020-11-20 NOTE — Patient Instructions (Signed)
[facility] [place]  Discharge Instructions: Thank you for choosing [facility] to provide your oncology and hematology care.  If you have a lab appointment with the Cancer Center, please go directly to the Cancer Center and check in at the registration area.   Wear comfortable clothing and clothing appropriate for easy access to any Portacath or PICC line.   We strive to give you quality time with your provider. You may need to reschedule your appointment if you arrive late (15 or more minutes).  Arriving late affects you and other patients whose appointments are after yours.  Also, if you miss three or more appointments without notifying the office, you may be dismissed from the clinic at the provider's discretion.      For prescription refill requests, have your pharmacy contact our office and allow 72 hours for refills to be completed.    Today you received the following chemotherapy and/or immunotherapy agents Trastuzumab     To help prevent nausea and vomiting after your treatment, we encourage you to take your nausea medication as directed.  BELOW ARE SYMPTOMS THAT SHOULD BE REPORTED IMMEDIATELY: *FEVER GREATER THAN 100.4 F (38 C) OR HIGHER *CHILLS OR SWEATING *NAUSEA AND VOMITING THAT IS NOT CONTROLLED WITH YOUR NAUSEA MEDICATION *UNUSUAL SHORTNESS OF BREATH *UNUSUAL BRUISING OR BLEEDING *URINARY PROBLEMS (pain or burning when urinating, or frequent urination) *BOWEL PROBLEMS (unusual diarrhea, constipation, pain near the anus) TENDERNESS IN MOUTH AND THROAT WITH OR WITHOUT PRESENCE OF ULCERS (sore throat, sores in mouth, or a toothache) UNUSUAL RASH, SWELLING OR PAIN  UNUSUAL VAGINAL DISCHARGE OR ITCHING   Items with * indicate a potential emergency and should be followed up as soon as possible or go to the Emergency Department if any problems should occur.  Please show the CHEMOTHERAPY ALERT CARD or IMMUNOTHERAPY ALERT CARD at check-in to the  Emergency Department and triage nurse.  Should you have questions after your visit or need to cancel or reschedule your appointment, please contact [facility] [place]  Dept: [phone]  and follow the prompts.  Office hours are 8:00 a.m. to 4:30 p.m. Monday - Friday. Please note that voicemails left after 4:00 p.m. may not be returned until the following business day.  We are closed weekends and major holidays. You have access to a nurse at all times for urgent questions. Please call the main number to the clinic Dept: [phone] and follow the prompts.  For any non-urgent questions, you may also contact your provider using MyChart. We now offer e-Visits for anyone 18 and older to request care online for non-urgent symptoms. For details visit [email or web address].   Also download the MyChart app! Go to the app store, search "MyChart", open the app, select [facility], and log in with your MyChart username and password.  Due to Covid, a mask is required upon entering the hospital/clinic. If you do not have a mask, one will be given to you upon arrival. For doctor visits, patients may have 1 support person aged 18 or older with them. For treatment visits, patients cannot have anyone with them due to current Covid guidelines and our immunocompromised population.    

## 2020-11-23 ENCOUNTER — Encounter: Payer: Self-pay | Admitting: Oncology

## 2020-11-28 ENCOUNTER — Encounter: Payer: Self-pay | Admitting: Oncology

## 2020-12-09 ENCOUNTER — Inpatient Hospital Stay (INDEPENDENT_AMBULATORY_CARE_PROVIDER_SITE_OTHER): Payer: Medicare Other | Admitting: Hematology and Oncology

## 2020-12-09 ENCOUNTER — Encounter: Payer: Self-pay | Admitting: Hematology and Oncology

## 2020-12-09 ENCOUNTER — Inpatient Hospital Stay: Payer: Medicare Other | Attending: Oncology

## 2020-12-09 DIAGNOSIS — Z170 Estrogen receptor positive status [ER+]: Secondary | ICD-10-CM | POA: Diagnosis not present

## 2020-12-09 DIAGNOSIS — C50411 Malignant neoplasm of upper-outer quadrant of right female breast: Secondary | ICD-10-CM | POA: Diagnosis not present

## 2020-12-09 DIAGNOSIS — Z5112 Encounter for antineoplastic immunotherapy: Secondary | ICD-10-CM | POA: Insufficient documentation

## 2020-12-09 DIAGNOSIS — Z79899 Other long term (current) drug therapy: Secondary | ICD-10-CM | POA: Insufficient documentation

## 2020-12-09 LAB — CBC AND DIFFERENTIAL
HCT: 36 (ref 36–46)
Hemoglobin: 12.1 (ref 12.0–16.0)
Neutrophils Absolute: 3.95
Platelets: 161 (ref 150–399)
WBC: 6.7

## 2020-12-09 LAB — COMPREHENSIVE METABOLIC PANEL
Albumin: 4.2 (ref 3.5–5.0)
Calcium: 10.5 (ref 8.7–10.7)

## 2020-12-09 LAB — HEPATIC FUNCTION PANEL
ALT: 31 (ref 7–35)
AST: 45 — AB (ref 13–35)
Alkaline Phosphatase: 61 (ref 25–125)
Bilirubin, Total: 1.1

## 2020-12-09 LAB — BASIC METABOLIC PANEL
BUN: 27 — AB (ref 4–21)
CO2: 29 — AB (ref 13–22)
Chloride: 104 (ref 99–108)
Creatinine: 0.8 (ref 0.5–1.1)
Glucose: 94
Potassium: 4.3 (ref 3.4–5.3)
Sodium: 141 (ref 137–147)

## 2020-12-09 LAB — CBC: RBC: 3.85 — AB (ref 3.87–5.11)

## 2020-12-09 NOTE — Progress Notes (Signed)
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  She received neoadjuvant trastuzumab/pertuzumab/paclitaxel with an excellent response.  She underwent lumpectomy in April 2022 and had a tiny residual invasive carcinoma measuring 2 mm. She received 4 cycles of adjuvant Kadcyla but this was discontinued due to toxicities.   She is now receiving single agent trastuzumab adjuvantly and is tolerating it well.  Since she has already had pre- and post-op treatments, she would be due to take 10 more doses to complete 1 year.  After much discussion with the patient and her daughter, she did not wish to take adjuvant hormonal therapy. She will proceed with a 5th cycle of trastuzumab this week.  We will plan to see her back in 3 weeks with a CBC, comprehensive metabolic panel and echocardiogram prior to a 6th cycle of trastuzumab.    The patient understands the plans discussed today and is in agreement with them.  She knows to contact our office if she develops concerns prior to her next appointment.      Brandy Pickles, PA-C  [facility] AT [facility] [address] [place] [address] Dept: [phone] Dept Fax: [phone]   No orders of the defined types were placed in this encounter.     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 female with clinical stage IB (T2 N0 M0) hormone and HER2/neu receptor positive right  breast cancer diagnosed in December 2021. She had a palpable upper right breast mass, so underwent diagnostic imaging. There was a 2.9 cm upper right breast mass highly suspicious for malignancy.  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 Estrogen and progesterone receptors were positive and HER2 positive.  Ki67 was 40%.  Due to the HER2 positivity, we recommended neoadjuvant therapy with trastuzumab, pertuzumab and weekly paclitaxel weekly for 3 cycles. Echocardiogram in December revealed overall normal left ventricular size and function with an ejection fraction of 60 to 65%. She received her first cycle of neoadjuvant trastuzumab/pertuzumab /paclitaxel in January. She underwent an additional stereotactic biopsy of an area in the inferior right breast later in January to guide surgery.  Pathology revealed atypical ductal hyperplasia.  She had difficulty tolerating therapy, mainly due to diarrhea, causing dehydration and hypokalemia, but she also had cytopenias. She completed neoadjuvant therapy in March.   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   She was then referred back to Dr. Lilia Pro and underwent lumpectomy on April 11th.  Pathology revealed a residual 2 mm, grade 2, invasive ductal carcinoma with foci of ductal carcinoma in situ and atypical lobular hyperplasia.  Estrogen and progesterone receptors were positive and HER2 positive.  Ki-67 was less than 5%.  As she did have a small residual cancer, adjuvant HER2 targeted therapy with Kadcyla (ado-trastuzumab emtansine) was recommended, for a total of a year of  HER2 targeted therapy. 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  She has had difficulty with memory, as well as expression since her stroke, which her daughter feels may have  increased since chemotherapy and surgery.  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  Echocardiogram on May 18th revealed normal left ventricular size and function with an ejection fraction between 55 to 60%.  Bone density scan revealed osteoporosis with a T-score of -3.8 in the femur, previously -2 and a T-score of -3.6 in the forearm, for which she is on vitamin D due to chronically elevated calcium.  She was placed on Kadcyla adjuvant chemotherapy at the end of May and initially tolerated it fairly well.  She had worsening neuropathy with tingling and pain of the bilateral feet and legs.  It was decided to stop the Kadcyla due to increasing toxicities.  The patient declines hormonal therapy or treatment of her osteoporosis due to toxicities and her advanced age and comorbidities. She was placed on single agent trastuzumab every 3 weeks and received her 1st dose on August 25th, with plans to complete a full year of trastuzumab.  Echocardiogram from August 23rd revealed a normal EF between 55 - 60 %.   INTERVAL HISTORY:  Brandy Jacobs is here today for repeat clinical assessment by her to 5th cycle of adjuvant trastuzumab.  She states she continues to tolerate this well.  She denies any changes in her breasts. She denies diarrhea. She reports an itchy rash of the dorsum of the left foot and between the toes for which she is using Benadryl cream. This may be fungal in nature so I will have her use over the counter antifungal cream.  She denies fevers or chills. She denies pain. Her appetite is good. Her weight has been stable.  REVIEW OF SYSTEMS:  Review of Systems  Constitutional:  Negative for appetite change, chills, fatigue, fever and unexpected weight change.  HENT:   Negative for lump/mass, mouth sores and sore throat.   Respiratory:  Negative for cough and shortness of breath.    Cardiovascular:  Negative for chest pain and leg swelling.  Gastrointestinal:  Negative for abdominal pain, constipation, diarrhea, nausea and vomiting.  Endocrine: Negative for hot flashes.  Genitourinary:  Negative for difficulty urinating, dysuria, frequency and hematuria.   Musculoskeletal:  Negative for arthralgias, back pain and myalgias.  Skin:  Positive for itching (left foot) and rash (left foot).  Neurological:  Negative for dizziness and headaches.  Hematological:  Negative for adenopathy. Does not bruise/bleed easily.  Psychiatric/Behavioral:  Negative for depression and sleep disturbance. The patient is not nervous/anxious.     VITALS:  Blood pressure (!) 152/65, pulse 66, temperature 98.2 F (36.8 C), temperature source Oral, resp. rate 18, height 5' 3.5" (1.613 m), weight 135 lb 9.6 oz (61.5 kg), SpO2 96 %.  Wt Readings from Last 3 Encounters:  12/09/20 135 lb 9.6 oz (61.5 kg)  11/20/20 135 lb 4 oz (61.3 kg)  11/19/20 133 lb 14.4 oz (60.7 kg)    Body mass index is 23.64 kg/m.  Performance status (ECOG): 1 - Symptomatic but completely ambulatory  PHYSICAL EXAM:  Physical Exam Vitals and nursing note reviewed.  Constitutional:      General: She is not in acute distress.    Appearance: Normal appearance.  HENT:     Head: Normocephalic and atraumatic.  Mouth/Throat:     Mouth: Mucous membranes are moist.     Pharynx: Oropharynx is clear. No oropharyngeal exudate or posterior oropharyngeal erythema.  Eyes:     General: No scleral icterus.    Extraocular Movements: Extraocular movements intact.     Conjunctiva/sclera: Conjunctivae normal.     Pupils: Pupils are equal, round, and reactive to light.  Cardiovascular:     Rate and Rhythm: Normal rate and regular rhythm.     Heart sounds: Normal heart sounds. No murmur heard.   No friction rub. No gallop.  Pulmonary:     Effort: Pulmonary effort is normal.     Breath sounds: Normal breath sounds. No wheezing,  rhonchi or rales.  Chest:     Comments: Breast exam is deferred per patient Abdominal:     General: There is no distension.     Palpations: Abdomen is soft. There is no hepatomegaly, splenomegaly or mass.     Tenderness: There is no abdominal tenderness.  Musculoskeletal:        General: Normal range of motion.     Cervical back: Normal range of motion and neck supple. No tenderness.     Right lower leg: No edema.     Left lower leg: No edema.  Lymphadenopathy:     Cervical: No cervical adenopathy.     Upper Body:     Right upper body: No supraclavicular or axillary adenopathy.     Left upper body: No supraclavicular or axillary adenopathy.     Lower Body: No right inguinal adenopathy. No left inguinal adenopathy.  Skin:    General: Skin is warm and dry.     Coloration: Skin is not jaundiced.     Findings: Rash (erythematous rash dorsum left foot) present.  Neurological:     Mental Status: She is alert and oriented to person, place, and time.     Cranial Nerves: No cranial nerve deficit.  Psychiatric:        Mood and Affect: Mood normal.        Behavior: Behavior normal.        Thought Content: Thought content normal.   LABS:   CBC Latest Ref Rng & Units 12/09/2020 11/19/2020 10/29/2020  WBC - 6.7 5.7 5.4  Hemoglobin 12.0 - 16.0 12.1 12.1 11.8(A)  Hematocrit 36 - 46 36 37 36  Platelets 150 - 399 161 172 165   CMP Latest Ref Rng & Units 12/09/2020 11/19/2020 10/29/2020  BUN 4 - 21 27(A) 23(A) 20  Creatinine 0.5 - 1.1 0.8 0.8 0.8  Sodium 137 - 147 141 141 139  Potassium 3.4 - 5.3 4.3 4.6 4.3  Chloride 99 - 108 104 107 104  CO2 13 - 22 29(A) 27(A) 27(A)  Calcium 8.7 - 10.7 10.5 10.9(A) 10.8(A)  Total Protein 6.3 - 8.2 g/dL - - -  Alkaline Phos 25 - 125 61 80 67  AST 13 - 35 45(A) 41(A) 48(A)  ALT 7 - 35 31 29 33     No results found for: CEA1 / No results found for: CEA1 No results found for: PSA1 No results found for: MWN027 No results found for: CAN125  No  results found for: TOTALPROTELP, ALBUMINELP, A1GS, A2GS, BETS, BETA2SER, GAMS, MSPIKE, SPEI No results found for: TIBC, FERRITIN, IRONPCTSAT No results found for: LDH  STUDIES:  No results found.    HISTORY:   Past Medical History:  Diagnosis Date   Arthritis    osteoarthritis   Breast cancer ([place])  Diabetes mellitus without complication ([place])    not on medication   History of CVA (cerebrovascular accident)    Hypertension    Multiple thyroid nodules    benign    Past Surgical History:  Procedure Laterality Date   BIOPSY THYROID     benign   BREAST BIOPSY      Family History  Problem Relation Age of Onset   Colon cancer Father        75s   Breast cancer Sister        74-60   Colon cancer Brother        76s   Prostate cancer Brother        late 65s   Breast cancer Half-Sister        late 37s early 68s    Social History:  reports that she has never smoked. She has never used smokeless tobacco. She reports that she does not currently use alcohol. She reports that she does not use drugs.The patient is accompanied by her daughter today.  Allergies: No Known Allergies  Current Medications: Current Outpatient Medications  Medication Sig Dispense Refill   acetaminophen (TYLENOL) 500 MG tablet Take 500 mg by mouth at bedtime.     aspirin EC 81 MG tablet Take 81 mg by mouth daily. Swallow whole.     atorvastatin (LIPITOR) 40 MG tablet Take 40 mg by mouth daily.     dexamethasone (DECADRON) 4 MG tablet Take 1 tablet (4 mg total) by mouth as directed. 3 pills night before and 3 pills morning of first chemo, then 1 pill night before and morning of weekly chemo 30 tablet 2   diphenhydrAMINE (BENADRYL) 25 mg capsule Take 25 mg by mouth at bedtime as needed. At night as needed for itching     losartan (COZAAR) 50 MG tablet Take 50 mg by mouth daily.     Vitamin D3 (VITAMIN D) 25 MCG tablet Take 1,000 Units by mouth daily.     No current facility-administered medications  for this visit.   Facility-Administered Medications Ordered in Other Visits  Medication Dose Route Frequency Provider Last Rate Last Admin   acetaminophen (TYLENOL) tablet 650 mg  650 mg Oral Once Derwood Kaplan, MD       diphenhydrAMINE (BENADRYL) capsule 50 mg  50 mg Oral Once Derwood Kaplan, MD       sodium chloride flush (NS) 0.9 % injection 10 mL  10 mL Intracatheter PRN Derwood Kaplan, MD   10 mL at 11/20/20 1410

## 2020-12-09 NOTE — Assessment & Plan Note (Addendum)
87 year old with stage IB hormone and HER2 receptor positive breast cancer diagnosed in December 2021.  She received neoadjuvant trastuzumab/pertuzumab/paclitaxel with an excellent response.  She underwent lumpectomy in April 2022 and had a tiny residual invasive carcinoma measuring 2 mm. She received 4 cycles of adjuvant Kadcyla but this was discontinued due to toxicities.   She is now receiving single agent trastuzumab adjuvantly and is tolerating it well.  Since she has already had pre- and post-op treatments, she would be due to take 10 more doses to complete 1 year.  After much discussion with the patient and her daughter, she did not wish to take adjuvant hormonal therapy. She will proceed with a 5th cycle of trastuzumab this week.  We will plan to see her back in 3 weeks with a CBC, comprehensive metabolic panel and echocardiogram prior to a 6th cycle of trastuzumab.

## 2020-12-10 ENCOUNTER — Encounter: Payer: Self-pay | Admitting: Oncology

## 2020-12-10 NOTE — Progress Notes (Unsigned)
No PA required from [facility] for CPT 93306 ECHO

## 2020-12-11 ENCOUNTER — Other Ambulatory Visit: Payer: Self-pay

## 2020-12-11 ENCOUNTER — Inpatient Hospital Stay: Payer: Medicare Other

## 2020-12-11 VITALS — BP 180/76 | HR 66 | Temp 98.0°F | Resp 18 | Ht 63.5 in | Wt 137.2 lb

## 2020-12-11 DIAGNOSIS — C50411 Malignant neoplasm of upper-outer quadrant of right female breast: Secondary | ICD-10-CM | POA: Diagnosis present

## 2020-12-11 DIAGNOSIS — Z170 Estrogen receptor positive status [ER+]: Secondary | ICD-10-CM | POA: Diagnosis not present

## 2020-12-11 DIAGNOSIS — Z5112 Encounter for antineoplastic immunotherapy: Secondary | ICD-10-CM | POA: Diagnosis not present

## 2020-12-11 DIAGNOSIS — Z79899 Other long term (current) drug therapy: Secondary | ICD-10-CM | POA: Diagnosis not present

## 2020-12-11 MED ORDER — HEPARIN SOD (PORK) LOCK FLUSH 100 UNIT/ML IV SOLN
500.0000 [IU] | Freq: Once | INTRAVENOUS | Status: AC | PRN
  Administered 2020-12-11: 500 [IU]

## 2020-12-11 MED ORDER — DIPHENHYDRAMINE HCL 25 MG PO CAPS: 50.0000 mg | ORAL_CAPSULE | Freq: Once | ORAL | Status: DC

## 2020-12-11 MED ORDER — SODIUM CHLORIDE 0.9 % IV SOLN: Freq: Once | INTRAVENOUS | Status: AC

## 2020-12-11 MED ORDER — ACETAMINOPHEN 325 MG PO TABS: 650.0000 mg | ORAL_TABLET | Freq: Once | ORAL | Status: DC

## 2020-12-11 MED ORDER — SODIUM CHLORIDE 0.9% FLUSH
10.0000 mL | INTRAVENOUS | Status: DC | PRN
  Administered 2020-12-11: 10 mL

## 2020-12-11 MED ORDER — TRASTUZUMAB-ANNS CHEMO 150 MG IV SOLR
6.0000 mg/kg | Freq: Once | INTRAVENOUS | Status: AC
  Administered 2020-12-11: 357 mg via INTRAVENOUS
  Filled 2020-12-11: qty 17

## 2020-12-11 NOTE — Patient Instructions (Signed)
[facility] [place]  Discharge Instructions: Thank you for choosing [facility] to provide your oncology and hematology care.  If you have a lab appointment with the Cancer Center, please go directly to the Cancer Center and check in at the registration area.   Wear comfortable clothing and clothing appropriate for easy access to any Portacath or PICC line.   We strive to give you quality time with your provider. You may need to reschedule your appointment if you arrive late (15 or more minutes).  Arriving late affects you and other patients whose appointments are after yours.  Also, if you miss three or more appointments without notifying the office, you may be dismissed from the clinic at the provider's discretion.      For prescription refill requests, have your pharmacy contact our office and allow 72 hours for refills to be completed.    Today you received the following chemotherapy and/or immunotherapy agents Trastuzumab     To help prevent nausea and vomiting after your treatment, we encourage you to take your nausea medication as directed.  BELOW ARE SYMPTOMS THAT SHOULD BE REPORTED IMMEDIATELY: *FEVER GREATER THAN 100.4 F (38 C) OR HIGHER *CHILLS OR SWEATING *NAUSEA AND VOMITING THAT IS NOT CONTROLLED WITH YOUR NAUSEA MEDICATION *UNUSUAL SHORTNESS OF BREATH *UNUSUAL BRUISING OR BLEEDING *URINARY PROBLEMS (pain or burning when urinating, or frequent urination) *BOWEL PROBLEMS (unusual diarrhea, constipation, pain near the anus) TENDERNESS IN MOUTH AND THROAT WITH OR WITHOUT PRESENCE OF ULCERS (sore throat, sores in mouth, or a toothache) UNUSUAL RASH, SWELLING OR PAIN  UNUSUAL VAGINAL DISCHARGE OR ITCHING   Items with * indicate a potential emergency and should be followed up as soon as possible or go to the Emergency Department if any problems should occur.  Please show the CHEMOTHERAPY ALERT CARD or IMMUNOTHERAPY ALERT CARD at check-in to the  Emergency Department and triage nurse.  Should you have questions after your visit or need to cancel or reschedule your appointment, please contact [facility] [place]  Dept: [phone]  and follow the prompts.  Office hours are 8:00 a.m. to 4:30 p.m. Monday - Friday. Please note that voicemails left after 4:00 p.m. may not be returned until the following business day.  We are closed weekends and major holidays. You have access to a nurse at all times for urgent questions. Please call the main number to the clinic Dept: [phone] and follow the prompts.  For any non-urgent questions, you may also contact your provider using MyChart. We now offer e-Visits for anyone 18 and older to request care online for non-urgent symptoms. For details visit [email or web address].   Also download the MyChart app! Go to the app store, search "MyChart", open the app, select [facility], and log in with your MyChart username and password.  Due to Covid, a mask is required upon entering the hospital/clinic. If you do not have a mask, one will be given to you upon arrival. For doctor visits, patients may have 1 support person aged 18 or older with them. For treatment visits, patients cannot have anyone with them due to current Covid guidelines and our immunocompromised population.    

## 2020-12-11 NOTE — Progress Notes (Signed)
1409: PT STABLE AT TIME OF DISCHARGE

## 2020-12-15 DIAGNOSIS — I361 Nonrheumatic tricuspid (valve) insufficiency: Secondary | ICD-10-CM

## 2020-12-15 DIAGNOSIS — I340 Nonrheumatic mitral (valve) insufficiency: Secondary | ICD-10-CM

## 2020-12-24 NOTE — Progress Notes (Signed)
[facility]  [address] [place],  [place]  [address] [phone]  Clinic Day:  12/31/2020  Referring physician: Guadalupe Maple, MD  This document serves as a record of services personally performed by Hosie Poisson, MD. It was created on their behalf by [facility], a trained medical scribe. The creation of this record is based on the scribe's personal observations and the provider's statements to them.  ASSESSMENT & PLAN:   Assessment & Plan: 1. 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  She had an excellent response to this, with just a tiny residual invasive carcinoma. She received 4 cycles of adjuvant Kadcyla and this was discontinued due to toxicities.   She has started single agent trastuzumab and is tolerating it well.  Since she has already had pre- and post-op treatments, she will take 11 more doses to complete 1 year.  The patient and her daughter state that after much discussion, she does not wish to take adjuvant hormonal therapy.   2. Mildly decreased left ventricular ejection fraction from previous echocardiogram, improved.  She will still need periodic echocardiograms during treatment with trastuzumab. She will be due for repeat ECHO in March.   3. Osteoporosis, she is taking vitamin-D, but not calcium. I had started her on calcium and I recommended placing her on Prolia, but she has poor dentition. She then developed mild hypercalcemia, so calcium was discontinued.  She has decided against treatment for the osteoporosis.   4. 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 I advised her to avoid Tylenol. She does not drink alcohol.   5. Hypercalcemia, improved and long standing.  PTH was normal. We will continue to monitor this.   6. 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   She will proceed with a 6th cycle of trastuzumab tomorrow. The patient has opted against endocrine therapy due to the potential side effects, and has declined treatment for her osteoporosis. We will see her back in 3 weeks with CBC and CMP prior to her 7th cycle.  The goal is to complete a full year of the trastuzumab and she already had neoadjuvant and adjuvant for 4 cycles. The patient and her daughter understand the plans discussed today and are in agreement with them.  They know to contact our office if she develops issues prior to her next appointment.  I provided 15 minutes of face-to-face time during this this encounter and > 50% was spent counseling as documented under my assessment and plan.    [facility] [address] [place] [address] Dept: [phone] Dept Fax: [phone]   No orders of the defined types were placed in this encounter.    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 She had a palpable upper right breast mass, so underwent diagnostic imaging. There was a 2.9 cm upper right breast mass highly suspicious for malignancy.  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 Estrogen and progesterone receptors were positive and HER2 positive.  Ki67 was  40%.  Due to the HER2 positivity, we recommended neoadjuvant therapy with trastuzumab, pertuzumab and weekly paclitaxel weekly for 3 cycles. Echocardiogram in December revealed overall normal left ventricular size and function with an ejection fraction of 60 to 65%. She received her first cycle of  neoadjuvant trastuzumab/pertuzumab /paclitaxel in January. She underwent an additional stereotactic biopsy of an area in the inferior right breast later in January to guide surgery.  Pathology revealed atypical ductal hyperplasia.  She had difficulty tolerating therapy, mainly due to diarrhea, causing dehydration and hypokalemia, but she also had cytopenias. She completed neoadjuvant therapy in March.   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   She was then referred back to Dr. Lilia Pro and underwent lumpectomy on April 11th.  Pathology revealed a residual 2 mm, grade 2, invasive ductal carcinoma with foci of ductal carcinoma in situ and atypical lobular hyperplasia.  Estrogen and progesterone receptors were positive and HER2 positive.  Ki-67 was less than 5%.  As she did have a small residual cancer, adjuvant HER2 targeted therapy with Kadcyla (ado-trastuzumab emtansine) was recommended, for a total of a year of HER2 targeted therapy. 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  She has had difficulty with memory, as well as expression since her stroke, which her daughter feels may have increased since chemotherapy and surgery.  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  Echocardiogram on May 18th revealed normal left ventricular size and function with an ejection fraction between 55 to 60%.  Bone density scan revealed osteoporosis with a T-score of -3.8 in the femur, previously -2 and a T-score of -3.6 in the forearm, for which she is on vitamin D due to chronically elevated calcium.  She was placed on Kadcyla adjuvant chemotherapy at the end of May and initially tolerated it fairly well.  She had worsening neuropathy with tingling and pain of the  bilateral feet and legs.  It was decided to stop the Kadcyla due to increasing toxicities.  The patient declines hormonal therapy or treatment of her osteoporosis due to toxicities and her advanced age and comorbidities. She was placed on single agent trastuzumab every 3 weeks and received her 1st dose on August 25th, with plans to complete a full year of trastuzumab.  Echocardiogram from August 23rd revealed a normal EF between 55 - 60 %.   INTERVAL HISTORY:  Brandy Jacobs is here for routine follow up prior to a 6th cycle of trastuzumab. ECHO from November 22nd revealed a normal ejection fraction of 55-60%. Bilateral mammogram from December 5th was clear. She states that she has been doing well other than bladder incontinence. Hemoglobin is fairly stable at 11.8, and white count and platelets are normal. Chemistries are unremarkable except for a BUN of 27, a calcium of 10.3, a total bilirubin of 1.4, and a mildly elevated SGOT of 41, stable. Her  appetite is good, and she has gained 1 and 1/2 pounds since her last visit.  She denies fever, chills or other signs of infection.  She denies nausea, vomiting, bowel issues, or abdominal pain.  She denies sore throat, cough, dyspnea, or chest pain.  REVIEW OF SYSTEMS:  Review of Systems  Constitutional: Negative.  Negative for appetite change, chills, fatigue, fever and unexpected weight change.  HENT:  Negative.  Eyes: Negative.   Respiratory: Negative.  Negative for chest tightness, cough, hemoptysis, shortness of breath and wheezing.   Cardiovascular: Negative.  Negative for chest pain, leg swelling and palpitations.  Gastrointestinal: Negative.  Negative for abdominal distention, abdominal pain, blood in stool, constipation, diarrhea, nausea and vomiting.  Endocrine: Negative.   Genitourinary:  Positive for bladder incontinence. Negative for difficulty urinating, dysuria, frequency and hematuria.   Musculoskeletal: Negative.  Negative for arthralgias, back  pain, flank pain, gait problem and myalgias.  Skin: Negative.   Neurological: Negative.  Negative for dizziness, extremity weakness, gait problem, headaches, light-headedness, numbness, seizures and speech difficulty.  Hematological: Negative.   Psychiatric/Behavioral: Negative.  Negative for depression and sleep disturbance. The patient is not nervous/anxious.     VITALS:  Blood pressure (!) 147/66, pulse 60, temperature 97.9 F (36.6 C), temperature source Oral, resp. rate 18, height 5' 3.5" (1.613 m), weight 136 lb 12.8 oz (62.1 kg), SpO2 98 %.  Wt Readings from Last 3 Encounters:  12/31/20 136 lb 12.8 oz (62.1 kg)  12/11/20 137 lb 4 oz (62.3 kg)  12/09/20 135 lb 9.6 oz (61.5 kg)    Body mass index is 23.85 kg/m.  Performance status (ECOG): 1 - Symptomatic but completely ambulatory  PHYSICAL EXAM:  Physical Exam Constitutional:      General: She is not in acute distress.    Appearance: Normal appearance. She is normal weight.  HENT:     Head: Normocephalic and atraumatic.  Eyes:     General: No scleral icterus.    Extraocular Movements: Extraocular movements intact.     Conjunctiva/sclera: Conjunctivae normal.     Pupils: Pupils are equal, round, and reactive to light.  Cardiovascular:     Rate and Rhythm: Normal rate and regular rhythm.     Pulses: Normal pulses.     Heart sounds: Normal heart sounds. No murmur heard.   No friction rub. No gallop.  Pulmonary:     Effort: Pulmonary effort is normal. No respiratory distress.     Breath sounds: Normal breath sounds.  Chest:     Comments: Barely visible well healed scar in the superior right breast. Both breasts are without masses. Abdominal:     General: Bowel sounds are normal. There is no distension.     Palpations: Abdomen is soft. There is no hepatomegaly, splenomegaly or mass.     Tenderness: There is no abdominal tenderness.  Musculoskeletal:        General: Normal range of motion.     Cervical back: Normal range  of motion and neck supple.     Right lower leg: No edema.     Left lower leg: No edema.  Lymphadenopathy:     Cervical: No cervical adenopathy.  Skin:    General: Skin is warm and dry.  Neurological:     General: No focal deficit present.     Mental Status: She is alert and oriented to person, place, and time. Mental status is at baseline.  Psychiatric:        Mood and Affect: Mood normal.        Behavior: Behavior normal.        Thought Content: Thought content normal.        Judgment: Judgment normal.   LABS:   CBC Latest Ref Rng & Units 12/31/2020 12/09/2020 11/19/2020  WBC - 5.4 6.7 5.7  Hemoglobin 12.0 - 16.0 11.8(A) 12.1 12.1  Hematocrit 36 - 46 34(A) 36 37  Platelets 150 -  399 165 161 172   CMP Latest Ref Rng & Units 12/31/2020 12/09/2020 11/19/2020  BUN 4 - 21 27(A) 27(A) 23(A)  Creatinine 0.5 - 1.1 0.8 0.8 0.8  Sodium 137 - 147 139 141 141  Potassium 3.4 - 5.3 4.1 4.3 4.6  Chloride 99 - 108 107 104 107  CO2 13 - 22 26(A) 29(A) 27(A)  Calcium 8.7 - 10.7 10.3 10.5 10.9(A)  Total Protein 6.3 - 8.2 g/dL - - -  Alkaline Phos 25 - 125 73 61 80  AST 13 - 35 41(A) 45(A) 41(A)  ALT 7 - 35 _0 STUDIES:  No results found.   EXAM: 12/28/2020 DIGITAL DIAGNOSTIC BILATERAL MAMMOGRAM WITH TOMOSYNTHESIS AND CAD  TECHNIQUE: Bilateral digital diagnostic mammography and breast tomosynthesis was performed. The images were evaluated with computer-aided detection.  COMPARISON: Previous exam(s).  ACR Breast Density Category b: There are scattered areas of fibroglandular density.  FINDINGS: Interval post lumpectomy changes on the right. No findings suspicious for malignancy in either breast.  IMPRESSION: No evidence of malignancy.  ECHO: 12/15/2020  CONCLUSION: 1. Overall left ventricular systolic function is normal with, an EF between 55 - 60 %. 2. The diastolic filling pattern indicates impaired relaxation. 3. Left atrium is mildly dilated by volume. 4.  Trace amount of aortic regurgitation. 5. Mild mitral regurgitation is present. 6. Mild tricuspid regurgitation present. 7. The right ventricular systolic pressure, as measured by Doppler, is 21 mmhg. 8. gls -19  HISTORY:   Allergies: No Known Allergies  Current Medications: Current Outpatient Medications  Medication Sig Dispense Refill   acetaminophen (TYLENOL) 500 MG tablet Take 500 mg by mouth at bedtime.     aspirin EC 81 MG tablet Take 81 mg by mouth daily. Swallow whole.     atorvastatin (LIPITOR) 40 MG tablet Take 40 mg by mouth daily.     dexamethasone (DECADRON) 4 MG tablet Take 1 tablet (4 mg total) by mouth as directed. 3 pills night before and 3 pills morning of first chemo, then 1 pill night before and morning of weekly chemo 30 tablet 2   diphenhydrAMINE (BENADRYL) 25 mg capsule Take 25 mg by mouth at bedtime as needed. At night as needed for itching     losartan (COZAAR) 50 MG tablet Take 50 mg by mouth daily.     Vitamin D3 (VITAMIN D) 25 MCG tablet Take 1,000 Units by mouth daily.     No current facility-administered medications for this visit.   Facility-Administered Medications Ordered in Other Visits  Medication Dose Route Frequency Provider Last Rate Last Admin   acetaminophen (TYLENOL) tablet 650 mg  650 mg Oral Once Derwood Kaplan, MD       diphenhydrAMINE (BENADRYL) capsule 50 mg  50 mg Oral Once Derwood Kaplan, MD       sodium chloride flush (NS) 0.9 % injection 10 mL  10 mL Intracatheter PRN Derwood Kaplan, MD   10 mL at 11/20/20 1410     I, Rita Ohara, am acting as scribe for Derwood Kaplan, MD  I have reviewed this report as typed by the medical scribe, and it is complete and accurate.

## 2020-12-25 ENCOUNTER — Other Ambulatory Visit: Payer: Self-pay | Admitting: Oncology

## 2020-12-29 ENCOUNTER — Encounter: Payer: Self-pay | Admitting: Oncology

## 2020-12-31 ENCOUNTER — Encounter: Payer: Self-pay | Admitting: Oncology

## 2020-12-31 ENCOUNTER — Inpatient Hospital Stay: Payer: Medicare Other | Attending: Oncology

## 2020-12-31 ENCOUNTER — Inpatient Hospital Stay (INDEPENDENT_AMBULATORY_CARE_PROVIDER_SITE_OTHER): Payer: Medicare Other | Admitting: Oncology

## 2020-12-31 ENCOUNTER — Other Ambulatory Visit: Payer: Self-pay | Admitting: Hematology and Oncology

## 2020-12-31 VITALS — BP 147/66 | HR 60 | Temp 97.9°F | Resp 18 | Ht 63.5 in | Wt 136.8 lb

## 2020-12-31 DIAGNOSIS — Z170 Estrogen receptor positive status [ER+]: Secondary | ICD-10-CM | POA: Diagnosis not present

## 2020-12-31 DIAGNOSIS — C50411 Malignant neoplasm of upper-outer quadrant of right female breast: Secondary | ICD-10-CM

## 2020-12-31 DIAGNOSIS — T451X5A Adverse effect of antineoplastic and immunosuppressive drugs, initial encounter: Secondary | ICD-10-CM | POA: Insufficient documentation

## 2020-12-31 DIAGNOSIS — G620 Drug-induced polyneuropathy: Secondary | ICD-10-CM | POA: Insufficient documentation

## 2020-12-31 DIAGNOSIS — Z79899 Other long term (current) drug therapy: Secondary | ICD-10-CM | POA: Insufficient documentation

## 2020-12-31 DIAGNOSIS — M810 Age-related osteoporosis without current pathological fracture: Secondary | ICD-10-CM

## 2020-12-31 DIAGNOSIS — Z5112 Encounter for antineoplastic immunotherapy: Secondary | ICD-10-CM | POA: Insufficient documentation

## 2020-12-31 DIAGNOSIS — R21 Rash and other nonspecific skin eruption: Secondary | ICD-10-CM | POA: Insufficient documentation

## 2020-12-31 LAB — BASIC METABOLIC PANEL
BUN: 27 — AB (ref 4–21)
CO2: 26 — AB (ref 13–22)
Chloride: 107 (ref 99–108)
Creatinine: 0.8 (ref 0.5–1.1)
Glucose: 106
Potassium: 4.1 (ref 3.4–5.3)
Sodium: 139 (ref 137–147)

## 2020-12-31 LAB — CBC AND DIFFERENTIAL
HCT: 34 — AB (ref 36–46)
Hemoglobin: 11.8 — AB (ref 12.0–16.0)
Neutrophils Absolute: 3.13
Platelets: 165 (ref 150–399)
WBC: 5.4

## 2020-12-31 LAB — CBC: RBC: 3.69 — AB (ref 3.87–5.11)

## 2020-12-31 LAB — HEPATIC FUNCTION PANEL
ALT: 24 (ref 7–35)
AST: 41 — AB (ref 13–35)
Alkaline Phosphatase: 73 (ref 25–125)
Bilirubin, Total: 1.4

## 2020-12-31 LAB — COMPREHENSIVE METABOLIC PANEL
Albumin: 4 (ref 3.5–5.0)
Calcium: 10.3 (ref 8.7–10.7)

## 2020-12-31 MED FILL — Trastuzumab-anns For IV Soln 150 MG: INTRAVENOUS | Qty: 17 | Status: AC

## 2021-01-01 ENCOUNTER — Inpatient Hospital Stay: Payer: Medicare Other

## 2021-01-01 ENCOUNTER — Other Ambulatory Visit: Payer: Self-pay

## 2021-01-01 VITALS — BP 148/51 | HR 62 | Temp 98.1°F | Resp 20 | Wt 134.0 lb

## 2021-01-01 DIAGNOSIS — Z170 Estrogen receptor positive status [ER+]: Secondary | ICD-10-CM | POA: Diagnosis not present

## 2021-01-01 DIAGNOSIS — Z5112 Encounter for antineoplastic immunotherapy: Secondary | ICD-10-CM | POA: Diagnosis present

## 2021-01-01 DIAGNOSIS — C50411 Malignant neoplasm of upper-outer quadrant of right female breast: Secondary | ICD-10-CM

## 2021-01-01 DIAGNOSIS — G620 Drug-induced polyneuropathy: Secondary | ICD-10-CM | POA: Diagnosis not present

## 2021-01-01 DIAGNOSIS — T451X5A Adverse effect of antineoplastic and immunosuppressive drugs, initial encounter: Secondary | ICD-10-CM | POA: Diagnosis not present

## 2021-01-01 DIAGNOSIS — Z79899 Other long term (current) drug therapy: Secondary | ICD-10-CM | POA: Diagnosis not present

## 2021-01-01 DIAGNOSIS — R21 Rash and other nonspecific skin eruption: Secondary | ICD-10-CM | POA: Diagnosis not present

## 2021-01-01 MED ORDER — TRASTUZUMAB-ANNS CHEMO 150 MG IV SOLR
6.0000 mg/kg | Freq: Once | INTRAVENOUS | Status: AC
  Administered 2021-01-01: 357 mg via INTRAVENOUS
  Filled 2021-01-01: qty 17

## 2021-01-01 MED ORDER — HEPARIN SOD (PORK) LOCK FLUSH 100 UNIT/ML IV SOLN
500.0000 [IU] | Freq: Once | INTRAVENOUS | Status: AC | PRN
  Administered 2021-01-01: 500 [IU]

## 2021-01-01 MED ORDER — DIPHENHYDRAMINE HCL 25 MG PO CAPS: 50.0000 mg | ORAL_CAPSULE | Freq: Once | ORAL | Status: DC

## 2021-01-01 MED ORDER — ACETAMINOPHEN 325 MG PO TABS: 650.0000 mg | ORAL_TABLET | Freq: Once | ORAL | Status: DC

## 2021-01-01 MED ORDER — SODIUM CHLORIDE 0.9 % IV SOLN: Freq: Once | INTRAVENOUS | Status: AC

## 2021-01-01 MED ORDER — SODIUM CHLORIDE 0.9% FLUSH
10.0000 mL | INTRAVENOUS | Status: DC | PRN
  Administered 2021-01-01: 10 mL

## 2021-01-01 NOTE — Patient Instructions (Signed)
Trastuzumab injection for infusion °What is this medication? °TRASTUZUMAB (tras TOO zoo mab) is a monoclonal antibody. It is used to treat breast cancer and stomach cancer. °This medicine may be used for other purposes; ask your health care provider or pharmacist if you have questions. °COMMON BRAND NAME(S): Herceptin, Herzuma, KANJINTI, Ogivri, Ontruzant, Trazimera °What should I tell my care team before I take this medication? °They need to know if you have any of these conditions: °heart disease °heart failure °lung or breathing disease, like asthma °an unusual or allergic reaction to trastuzumab, benzyl alcohol, or other medications, foods, dyes, or preservatives °pregnant or trying to get pregnant °breast-feeding °How should I use this medication? °This drug is given as an infusion into a vein. It is administered in a hospital or clinic by a specially trained health care professional. °Talk to your pediatrician regarding the use of this medicine in children. This medicine is not approved for use in children. °Overdosage: If you think you have taken too much of this medicine contact a poison control center or emergency room at once. °NOTE: This medicine is only for you. Do not share this medicine with others. °What if I miss a dose? °It is important not to miss a dose. Call your doctor or health care professional if you are unable to keep an appointment. °What may interact with this medication? °This medicine may interact with the following medications: °certain types of chemotherapy, such as daunorubicin, doxorubicin, epirubicin, and idarubicin °This list may not describe all possible interactions. Give your health care provider a list of all the medicines, herbs, non-prescription drugs, or dietary supplements you use. Also tell them if you smoke, drink alcohol, or use illegal drugs. Some items may interact with your medicine. °What should I watch for while using this medication? °Visit your doctor for checks  on your progress. Report any side effects. Continue your course of treatment even though you feel ill unless your doctor tells you to stop. °Call your doctor or health care professional for advice if you get a fever, chills or sore throat, or other symptoms of a cold or flu. Do not treat yourself. Try to avoid being around people who are sick. °You may experience fever, chills and shaking during your first infusion. These effects are usually mild and can be treated with other medicines. Report any side effects during the infusion to your health care professional. Fever and chills usually do not happen with later infusions. °Do not become pregnant while taking this medicine or for 7 months after stopping it. Women should inform their doctor if they wish to become pregnant or think they might be pregnant. Women of child-bearing potential will need to have a negative pregnancy test before starting this medicine. There is a potential for serious side effects to an unborn child. Talk to your health care professional or pharmacist for more information. Do not breast-feed an infant while taking this medicine or for 7 months after stopping it. °Women must use effective birth control with this medicine. °What side effects may I notice from receiving this medication? °Side effects that you should report to your doctor or health care professional as soon as possible: °allergic reactions like skin rash, itching or hives, swelling of the face, lips, or tongue °chest pain or palpitations °cough °dizziness °feeling faint or lightheaded, falls °fever °general ill feeling or flu-like symptoms °signs of worsening heart failure like breathing problems; swelling in your legs and feet °unusually weak or tired °Side effects that usually   do not require medical attention (report to your doctor or health care professional if they continue or are bothersome): °bone pain °changes in taste °diarrhea °joint pain °nausea/vomiting °weight  loss °This list may not describe all possible side effects. Call your doctor for medical advice about side effects. You may report side effects to FDA at 1-800-FDA-1088. °Where should I keep my medication? °This drug is given in a hospital or clinic and will not be stored at home. °NOTE: This sheet is a summary. It may not cover all possible information. If you have questions about this medicine, talk to your doctor, pharmacist, or health care provider. °© 2022 [employer] (2016-01-26 00:00:00) ° °

## 2021-01-06 ENCOUNTER — Encounter: Payer: Self-pay | Admitting: Oncology

## 2021-01-07 ENCOUNTER — Encounter: Payer: Self-pay | Admitting: Oncology

## 2021-01-21 ENCOUNTER — Encounter: Payer: Self-pay | Admitting: Hematology and Oncology

## 2021-01-21 ENCOUNTER — Telehealth: Payer: Self-pay | Admitting: Hematology and Oncology

## 2021-01-21 ENCOUNTER — Inpatient Hospital Stay (INDEPENDENT_AMBULATORY_CARE_PROVIDER_SITE_OTHER): Payer: Medicare Other | Admitting: Hematology and Oncology

## 2021-01-21 ENCOUNTER — Inpatient Hospital Stay: Payer: Medicare Other

## 2021-01-21 DIAGNOSIS — T451X5A Adverse effect of antineoplastic and immunosuppressive drugs, initial encounter: Secondary | ICD-10-CM

## 2021-01-21 DIAGNOSIS — G620 Drug-induced polyneuropathy: Secondary | ICD-10-CM

## 2021-01-21 DIAGNOSIS — C50411 Malignant neoplasm of upper-outer quadrant of right female breast: Secondary | ICD-10-CM

## 2021-01-21 DIAGNOSIS — Z170 Estrogen receptor positive status [ER+]: Secondary | ICD-10-CM

## 2021-01-21 DIAGNOSIS — R21 Rash and other nonspecific skin eruption: Secondary | ICD-10-CM

## 2021-01-21 LAB — HEPATIC FUNCTION PANEL
ALT: 25 (ref 7–35)
AST: 39 — AB (ref 13–35)
Alkaline Phosphatase: 65 (ref 25–125)
Bilirubin, Total: 1.6

## 2021-01-21 LAB — CBC AND DIFFERENTIAL
HCT: 37 (ref 36–46)
Hemoglobin: 12 (ref 12.0–16.0)
Neutrophils Absolute: 3.78
Platelets: 179 (ref 150–399)
WBC: 6.3

## 2021-01-21 LAB — BASIC METABOLIC PANEL WITH GFR
BUN: 21 (ref 4–21)
CO2: 30 — AB (ref 13–22)
Chloride: 104 (ref 99–108)
Creatinine: 0.8 (ref 0.5–1.1)
Glucose: 107
Potassium: 4.2 (ref 3.4–5.3)
Sodium: 138 (ref 137–147)

## 2021-01-21 LAB — COMPREHENSIVE METABOLIC PANEL
Albumin: 4.2 (ref 3.5–5.0)
Calcium: 10.6 (ref 8.7–10.7)

## 2021-01-21 LAB — CBC
MCV: 94 (ref 81–99)
RBC: 3.89 (ref 3.87–5.11)

## 2021-01-21 MED ORDER — FLUCONAZOLE 100 MG PO TABS: 100.0000 mg | ORAL_TABLET | Freq: Every day | ORAL | 1 refills | Status: DC

## 2021-01-21 NOTE — Assessment & Plan Note (Signed)
Peripheral neuropathy of the hands and feet secondary to paclitaxel chemotherapy.  This is stable.

## 2021-01-21 NOTE — Telephone Encounter (Signed)
Per 12/29 LOS, patient already scheduled for Jan 2023 Appt's

## 2021-01-21 NOTE — Assessment & Plan Note (Signed)
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 Status post neoadjuvant trastuzumab/pertuzumab/paclitaxel, with an excellent response. There was just a tiny residual invasive carcinoma. She received 4 cycles of adjuvant Kadcyla, but this was discontinued due to toxicities. She has started single agent trastuzumab to complete a year of HER2 targeted therapy and is tolerating itwell. The patient and her daughter declined adjuvant hormonal therapy after much discussion.  She will proceed with her 7th cycle of trastuzumab tomorrow.  We will plan to see her back in 3 weeks with a CBC and comprehensive metabolic panel prior to her 8th cycle.

## 2021-01-21 NOTE — Assessment & Plan Note (Addendum)
Mild hyperbilirubinemia due to disorder of bilirubin metabolism, which is stable.

## 2021-01-21 NOTE — Assessment & Plan Note (Signed)
Erythematous rash of the dorsum of the bilateral feet most consistent with fungal infection.  This is not improving with topical treatment.  We therefore will place her on fluconazole 100 mg daily for 10 days with 1 refill.  If she has improvement but not resolution, she will take a second 10-day course.

## 2021-01-21 NOTE — Assessment & Plan Note (Addendum)
Mild hypercalcemia, which is stable.

## 2021-01-21 NOTE — Progress Notes (Signed)
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 Status post neoadjuvant trastuzumab/pertuzumab/paclitaxel, with an excellent response. There was just a tiny residual invasive carcinoma. She received 4 cycles of adjuvant Kadcyla, but this was discontinued due to toxicities.   She has started single agent trastuzumab to complete a year of HER2 targeted therapy and is tolerating it well. The patient and her daughter declined adjuvant hormonal therapy after much discussion.  She will proceed with her 7th cycle of trastuzumab tomorrow.  We will plan to see her back in 3 weeks with a CBC and comprehensive metabolic panel prior to her 8th cycle.  Hypercalcemia Mild hypercalcemia, which is stable.   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  This is stable.  Rash of both feet Erythematous rash of the dorsum of the bilateral feet most consistent with fungal infection.  This is not improving with topical treatment.  We therefore will place her on fluconazole 100 mg daily for 10 days with 1 refill.  If she has improvement but not resolution, she will take a second 10-day course.   The patient and her daughter understand the plans discussed today and are in agreement with them.  They know to contact our office if she develops concerns prior to her next appointment.  The patient was seen, examined and plan formulated with Dr. Hinton Rao.    Marvia Pickles, PA-C  [facility] [address] [place] [address] Dept: [phone] Dept Fax: [phone]   Orders Placed This Encounter  Procedures   CBC and differential    This external order was created through the Results Console.   CBC    This external order was created through the Results Console.   Basic metabolic panel    This external order was created through the Results Console.   Comprehensive metabolic panel    This external order was created through the Results Console.   Hepatic function panel    This external order was created through the Results Console.   CBC    This order was created through External Result Entry      CHIEF COMPLAINT:  CC: Stage IIB hormone and HER2 receptor positive breast cancer  Current Treatment: Adjuvant trastuzumab every 3 weeks   HISTORY OF PRESENT ILLNESS:  Brandy Jacobs is an 87 year old female with clinical stage IB (T2 N0 M0) hormone and HER2/neu receptor positive right breast cancer diagnosed in December 2021. She had a palpable upper right breast mass, so underwent diagnostic imaging. There was a 2.9 cm upper right breast mass highly suspicious for malignancy.  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 Estrogen and progesterone receptors were positive and HER2 positive.  Ki67 was 40%.  Due to the HER2 positivity, we recommended neoadjuvant therapy with trastuzumab, pertuzumab and weekly paclitaxel weekly for 3 cycles. Echocardiogram in December revealed overall normal left ventricular size and function with an ejection fraction of 60 to 65%.  She received her first cycle of neoadjuvant trastuzumab/pertuzumab /paclitaxel in January. She underwent an additional stereotactic biopsy of an area in the inferior right breast later in January to guide surgery.  Pathology revealed atypical ductal hyperplasia.  She had difficulty  tolerating therapy, mainly due to diarrhea, causing dehydration and hypokalemia, but she also had cytopenias. She completed neoadjuvant therapy in March.   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   She was then referred back to Dr. Lilia Pro and underwent lumpectomy in April.  Pathology revealed a residual 2 mm, grade 2, invasive ductal carcinoma with foci of ductal carcinoma in situ and atypical lobular hyperplasia.  Estrogen and progesterone receptors were positive and HER2 positive.  Ki-67 was less than 5%.  As she did have a small residual cancer, adjuvant HER2 targeted therapy with Kadcyla (ado-trastuzumab emtansine) was recommended, for a total of a year of HER2 targeted therapy. 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  She has had difficulty with memory, as well as expression since her stroke, which her daughter feels may have increased since chemotherapy and surgery.  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  Echocardiogram in May revealed normal left ventricular size and function with an ejection fraction between 55 to 60%.  Bone density scan in May revealed osteoporosis with a T-score of -3.8 in the femur, previously -2 and a T-score of -3.6 in the forearm, for which she is on vitamin D alone due to chronically elevated calcium. The patient declines hormonal therapy or treatment of her osteoporosis due to toxicities and her advanced age and comorbidities. She was placed on adjuvant Kadcyla due to the residual disease at the end of May and initially tolerated it fairly well.  She then had worsening neuropathy with tingling and pain of the bilateral feet and legs.  Kadcyla was discontinued due to increasing toxicities and she was placed  on single agent trastuzumab every 3 weeks.  Which she started in August, with plans to complete a full year of HER2 targeted therapy.  Echocardiogram in August revealed a normal EF between 55 - 60 %.  She has continued trastuzumab every 3 weeks without significant difficulty.  Echocardiogram from November 22nd revealed a normal ejection fraction of 55-60%. Bilateral mammogram on December 5th did not reveal any evidence of malignancy.   INTERVAL HISTORY:  Brandy Jacobs is here today for repeat clinical assessment prior to 7th cycle of trastuzumab.  She states she continues to tolerate this without significant difficulty.  Is any changes in her breasts.  She reports stable neuropathy of the hands and feet.  She saw Dr. Luana Shu for the rash of her left foot and he felt it was fungal, so recommended over-the-counter topical treatment.  She now reports rash of both feet despite topical antifungal powder and cream.  She denies fevers or chills. She denies pain. Her appetite is good. Her weight has been stable.  REVIEW OF SYSTEMS:  Review of Systems  Constitutional:  Negative for appetite change, chills, fatigue, fever and unexpected weight change.  HENT:   Negative for lump/mass, mouth sores and sore throat.   Respiratory:  Negative for cough and shortness of breath.   Cardiovascular:  Negative for chest pain and leg swelling.  Gastrointestinal:  Negative for abdominal pain, constipation, diarrhea,  nausea and vomiting.  Genitourinary:  Negative for difficulty urinating, dysuria, frequency and hematuria.   Musculoskeletal:  Positive for gait problem (Uses walker to ambulate). Negative for arthralgias, back pain and myalgias.  Skin:  Positive for itching and rash.  Neurological:  Positive for gait problem (Uses walker to ambulate). Negative for dizziness and headaches.  Hematological:  Negative for adenopathy. Does not bruise/bleed easily.  Psychiatric/Behavioral:  Negative for depression and sleep disturbance. The  patient is not nervous/anxious.     VITALS:  Blood pressure (!) 152/68, pulse 66, temperature 97.9 F (36.6 C), resp. rate 16, height 5' 3.5" (1.613 m), weight 135 lb 4.8 oz (61.4 kg), SpO2 94 %.  Wt Readings from Last 3 Encounters:  01/21/21 135 lb 4.8 oz (61.4 kg)  01/01/21 134 lb (60.8 kg)  12/31/20 136 lb 12.8 oz (62.1 kg)    Body mass index is 23.59 kg/m.  Performance status (ECOG): 1 - Symptomatic but completely ambulatory  PHYSICAL EXAM:  Physical Exam Vitals and nursing note reviewed.  Constitutional:      General: She is not in acute distress.    Appearance: Normal appearance.  HENT:     Head: Normocephalic and atraumatic.     Mouth/Throat:     Mouth: Mucous membranes are moist.     Pharynx: Oropharynx is clear. No oropharyngeal exudate or posterior oropharyngeal erythema.  Eyes:     General: No scleral icterus.    Extraocular Movements: Extraocular movements intact.     Conjunctiva/sclera: Conjunctivae normal.     Pupils: Pupils are equal, round, and reactive to light.  Cardiovascular:     Rate and Rhythm: Normal rate and regular rhythm.     Heart sounds: Normal heart sounds. No murmur heard.   No friction rub. No gallop.  Pulmonary:     Effort: Pulmonary effort is normal.     Breath sounds: Normal breath sounds. No wheezing, rhonchi or rales.  Chest:  Breasts:    Right: Normal. No swelling, bleeding, inverted nipple, mass, nipple discharge, skin change or tenderness.     Left: Normal. No swelling, bleeding, inverted nipple, mass, nipple discharge, skin change or tenderness.  Abdominal:     General: There is no distension.     Palpations: Abdomen is soft. There is no hepatomegaly, splenomegaly or mass.     Tenderness: There is no abdominal tenderness.  Musculoskeletal:        General: Normal range of motion.     Cervical back: Normal range of motion and neck supple. No tenderness.     Right lower leg: No edema.     Left lower leg: No edema.   Lymphadenopathy:     Cervical: No cervical adenopathy.     Upper Body:     Right upper body: No supraclavicular or axillary adenopathy.     Left upper body: No supraclavicular or axillary adenopathy.     Lower Body: No right inguinal adenopathy. No left inguinal adenopathy.  Skin:    General: Skin is warm and dry.     Coloration: Skin is not jaundiced.     Findings: Rash (Erythematous rash of the dorsum of bilateral feet consistent) present.  Neurological:     Mental Status: She is alert and oriented to person, place, and time.     Cranial Nerves: No cranial nerve deficit.  Psychiatric:        Mood and Affect: Mood normal.        Behavior: Behavior normal.  Thought Content: Thought content normal.    LABS:   CBC Latest Ref Rng & Units 01/21/2021 12/31/2020 12/09/2020  WBC - 6.3 5.4 6.7  Hemoglobin 12.0 - 16.0 12.0 11.8(A) 12.1  Hematocrit 36 - 46 37 34(A) 36  Platelets 150 - 399 179 165 161   CMP Latest Ref Rng & Units 01/21/2021 12/31/2020 12/09/2020  BUN 4 - 21 21 27(A) 27(A)  Creatinine 0.5 - 1.1 0.8 0.8 0.8  Sodium 137 - 147 138 139 141  Potassium 3.4 - 5.3 4.2 4.1 4.3  Chloride 99 - 108 104 107 104  CO2 13 - 22 30(A) 26(A) 29(A)  Calcium 8.7 - 10.7 10.6 10.3 10.5  Total Protein 6.3 - 8.2 g/dL - - -  Alkaline Phos 25 - 125 65 73 61  AST 13 - 35 39(A) 41(A) 45(A)  ALT 7 - 35 _0 No results found for: CEA1 / No results found for: CEA1 No results found for: PSA1 No results found for: TXL217 No results found for: GJF595  No results found for: TOTALPROTELP, ALBUMINELP, A1GS, A2GS, BETS, BETA2SER, GAMS, MSPIKE, SPEI No results found for: TIBC, FERRITIN, IRONPCTSAT No results found for: LDH  STUDIES:  No results found.    HISTORY:   Past Medical History:  Diagnosis Date   Arthritis    osteoarthritis   Breast cancer ([place])    Diabetes mellitus without complication ([place])    not on medication   History of CVA (cerebrovascular accident)     Hypertension    Multiple thyroid nodules    benign    Past Surgical History:  Procedure Laterality Date   BIOPSY THYROID     benign   BREAST BIOPSY     BREAST LUMPECTOMY W/ NEEDLE LOCALIZATION Right 05/04/2020    Family History  Problem Relation Age of Onset   Colon cancer Father        33s   Breast cancer Sister        30-60   Colon cancer Brother        29s   Prostate cancer Brother        late 85s   Breast cancer Half-Sister        late 41s early 3s    Social History:  reports that she has never smoked. She has never used smokeless tobacco. She reports that she does not currently use alcohol. She reports that she does not use drugs.The patient is accompanied by her daughter today.  Allergies: No Known Allergies  Current Medications: Current Outpatient Medications  Medication Sig Dispense Refill   fluconazole (DIFLUCAN) 100 MG tablet Take 1 tablet (100 mg total) by mouth daily. 10 tablet 1   acetaminophen (TYLENOL) 500 MG tablet Take 500 mg by mouth at bedtime.     aspirin EC 81 MG tablet Take 81 mg by mouth daily. Swallow whole.     atorvastatin (LIPITOR) 40 MG tablet Take 40 mg by mouth daily.     dexamethasone (DECADRON) 4 MG tablet Take 1 tablet (4 mg total) by mouth as directed. 3 pills night before and 3 pills morning of first chemo, then 1 pill night before and morning of weekly chemo 30 tablet 2   diphenhydrAMINE (BENADRYL) 25 mg capsule Take 25 mg by mouth at bedtime as needed. At night as needed for itching     losartan (COZAAR) 50 MG tablet Take 50 mg by mouth daily.     Vitamin D3 (VITAMIN D) 25  MCG tablet Take 1,000 Units by mouth daily.     No current facility-administered medications for this visit.   Facility-Administered Medications Ordered in Other Visits  Medication Dose Route Frequency Provider Last Rate Last Admin   acetaminophen (TYLENOL) tablet 650 mg  650 mg Oral Once Derwood Kaplan, MD       diphenhydrAMINE (BENADRYL) capsule 50 mg  50  mg Oral Once Derwood Kaplan, MD       sodium chloride flush (NS) 0.9 % injection 10 mL  10 mL Intracatheter PRN Derwood Kaplan, MD   10 mL at 11/20/20 1410

## 2021-01-22 ENCOUNTER — Inpatient Hospital Stay: Payer: Medicare Other

## 2021-01-22 ENCOUNTER — Other Ambulatory Visit: Payer: Self-pay

## 2021-01-22 VITALS — BP 129/54 | HR 64 | Temp 98.1°F | Resp 18 | Ht 63.5 in | Wt 136.1 lb

## 2021-01-22 DIAGNOSIS — Z5112 Encounter for antineoplastic immunotherapy: Secondary | ICD-10-CM | POA: Diagnosis not present

## 2021-01-22 DIAGNOSIS — C50411 Malignant neoplasm of upper-outer quadrant of right female breast: Secondary | ICD-10-CM

## 2021-01-22 MED ORDER — DIPHENHYDRAMINE HCL 25 MG PO CAPS: 50.0000 mg | ORAL_CAPSULE | Freq: Once | ORAL | Status: DC

## 2021-01-22 MED ORDER — SODIUM CHLORIDE 0.9% FLUSH
10.0000 mL | INTRAVENOUS | Status: DC | PRN
  Administered 2021-01-22: 10 mL

## 2021-01-22 MED ORDER — SODIUM CHLORIDE 0.9 % IV SOLN: Freq: Once | INTRAVENOUS | Status: AC

## 2021-01-22 MED ORDER — ACETAMINOPHEN 325 MG PO TABS: 650.0000 mg | ORAL_TABLET | Freq: Once | ORAL | Status: DC

## 2021-01-22 MED ORDER — HEPARIN SOD (PORK) LOCK FLUSH 100 UNIT/ML IV SOLN
500.0000 [IU] | Freq: Once | INTRAVENOUS | Status: AC | PRN
  Administered 2021-01-22: 500 [IU]

## 2021-01-22 MED ORDER — TRASTUZUMAB-ANNS CHEMO 150 MG IV SOLR
6.0000 mg/kg | Freq: Once | INTRAVENOUS | Status: AC
  Administered 2021-01-22: 357 mg via INTRAVENOUS
  Filled 2021-01-22: qty 17

## 2021-01-22 NOTE — Patient Instructions (Signed)
[facility]  Discharge Instructions: Thank you for choosing [facility] to provide your oncology and hematology care.  If you have a lab appointment with the [facility], please go directly to the [facility] and check in at the registration area.   Wear comfortable clothing and clothing appropriate for easy access to any Portacath or PICC line.   We strive to give you quality time with your provider. You may need to reschedule your appointment if you arrive late (15 or more minutes).  Arriving late affects you and other patients whose appointments are after yours.  Also, if you miss three or more appointments without notifying the office, you may be dismissed from the clinic at the providers discretion.      For prescription refill requests, have your pharmacy contact our office and allow 72 hours for refills to be completed.    Today you received the following chemotherapy and/or immunotherapy agents trastuzumab   To help prevent nausea and vomiting after your treatment, we encourage you to take your nausea medication as directed.  BELOW ARE SYMPTOMS THAT SHOULD BE REPORTED IMMEDIATELY: *FEVER GREATER THAN 100.4 F (38 C) OR HIGHER *CHILLS OR SWEATING *NAUSEA AND VOMITING THAT IS NOT CONTROLLED WITH YOUR NAUSEA MEDICATION *UNUSUAL SHORTNESS OF BREATH *UNUSUAL BRUISING OR BLEEDING *URINARY PROBLEMS (pain or burning when urinating, or frequent urination) *BOWEL PROBLEMS (unusual diarrhea, constipation, pain near the anus) TENDERNESS IN MOUTH AND THROAT WITH OR WITHOUT PRESENCE OF ULCERS (sore throat, sores in mouth, or a toothache) UNUSUAL RASH, SWELLING OR PAIN  UNUSUAL VAGINAL DISCHARGE OR ITCHING   Items with * indicate a potential emergency and should be followed up as soon as possible or go to the Emergency Department if any problems should occur.  Please show the CHEMOTHERAPY ALERT CARD or IMMUNOTHERAPY ALERT CARD at check-in to the  Emergency Department and triage nurse.  Should you have questions after your visit or need to cancel or reschedule your appointment, please contact [facility]  Dept: [phone]  and follow the prompts.  Office hours are 8:00 a.m. to 4:30 p.m. Monday - Friday. Please note that voicemails left after 4:00 p.m. may not be returned until the following business day.  We are closed weekends and major holidays. You have access to a nurse at all times for urgent questions. Please call the main number to the clinic Dept: [phone] and follow the prompts.  For any non-urgent questions, you may also contact your provider using MyChart. We now offer e-Visits for anyone 71 and older to request care online for non-urgent symptoms. For details visit [email or web address].   Also download the MyChart app! Go to the app store, search "MyChart", open the app, select [facility], and log in with your MyChart username and password.  Due to Covid, a mask is required upon entering the hospital/clinic. If you do not have a mask, one will be given to you upon arrival. For doctor visits, patients may have 1 support person aged 27 or older with them. For treatment visits, patients cannot have anyone with them due to current Covid guidelines and our immunocompromised population.   Trastuzumab injection for infusion What is this medication? TRASTUZUMAB (tras TOO zoo mab) is a monoclonal antibody. It is used to treat breast cancer and stomach cancer. This medicine may be used for other purposes; ask your health care provider or pharmacist if you have questions. COMMON BRAND NAME(S): Herceptin, Belenda Cruise, Ogivri, Ontruzant,  Trazimera What should I tell my care team before I take this medication? They need to know if you have any of these conditions: heart disease heart failure lung or breathing disease, like asthma an unusual or allergic reaction to trastuzumab, benzyl alcohol, or other  medications, foods, dyes, or preservatives pregnant or trying to get pregnant breast-feeding How should I use this medication? This drug is given as an infusion into a vein. It is administered in a hospital or clinic by a specially trained health care professional. Talk to your pediatrician regarding the use of this medicine in children. This medicine is not approved for use in children. Overdosage: If you think you have taken too much of this medicine contact a poison control center or emergency room at once. NOTE: This medicine is only for you. Do not share this medicine with others. What if I miss a dose? It is important not to miss a dose. Call your doctor or health care professional if you are unable to keep an appointment. What may interact with this medication? This medicine may interact with the following medications: certain types of chemotherapy, such as daunorubicin, doxorubicin, epirubicin, and idarubicin This list may not describe all possible interactions. Give your health care provider a list of all the medicines, herbs, non-prescription drugs, or dietary supplements you use. Also tell them if you smoke, drink alcohol, or use illegal drugs. Some items may interact with your medicine. What should I watch for while using this medication? Visit your doctor for checks on your progress. Report any side effects. Continue your course of treatment even though you feel ill unless your doctor tells you to stop. Call your doctor or health care professional for advice if you get a fever, chills or sore throat, or other symptoms of a cold or flu. Do not treat yourself. Try to avoid being around people who are sick. You may experience fever, chills and shaking during your first infusion. These effects are usually mild and can be treated with other medicines. Report any side effects during the infusion to your health care professional. Fever and chills usually do not happen with later infusions. Do  not become pregnant while taking this medicine or for 7 months after stopping it. Women should inform their doctor if they wish to become pregnant or think they might be pregnant. Women of child-bearing potential will need to have a negative pregnancy test before starting this medicine. There is a potential for serious side effects to an unborn child. Talk to your health care professional or pharmacist for more information. Do not breast-feed an infant while taking this medicine or for 7 months after stopping it. Women must use effective birth control with this medicine. What side effects may I notice from receiving this medication? Side effects that you should report to your doctor or health care professional as soon as possible: allergic reactions like skin rash, itching or hives, swelling of the face, lips, or tongue chest pain or palpitations cough dizziness feeling faint or lightheaded, falls fever general ill feeling or flu-like symptoms signs of worsening heart failure like breathing problems; swelling in your legs and feet unusually weak or tired Side effects that usually do not require medical attention (report to your doctor or health care professional if they continue or are bothersome): bone pain changes in taste diarrhea joint pain nausea/vomiting weight loss This list may not describe all possible side effects. Call your doctor for medical advice about side effects. You may report side effects  to FDA at 1-800-FDA-1088. Where should I keep my medication? This drug is given in a hospital or clinic and will not be stored at home. NOTE: This sheet is a summary. It may not cover all possible information. If you have questions about this medicine, talk to your doctor, pharmacist, or health care provider.  2022 Elsevier/Gold Standard (2016-01-26 00:00:00)

## 2021-01-28 ENCOUNTER — Encounter: Payer: Self-pay | Admitting: Oncology

## 2021-02-05 NOTE — Progress Notes (Signed)
[facility]  [address] [place],  [name]  [address] [phone]  Clinic Day:  02/11/2021  Referring physician: Guadalupe Maple, MD  This document serves as a record of services personally performed by Brandy Poisson, MD. It was created on their behalf by Brandy Jacobs, a trained medical scribe. The creation of this record is based on the scribe's personal observations and the provider's statements to them.  ASSESSMENT & PLAN:   Assessment & Plan: 1. 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  She had an excellent response to this, with just a tiny residual invasive carcinoma. She received 4 cycles of adjuvant Kadcyla and this was discontinued due to toxicities.   She has started single agent trastuzumab and is tolerating it well.  Since she has already had pre- and post-op treatments, she will take 11 more doses to complete 1 year.  The patient and her daughter state that after much discussion, she does not wish to take adjuvant hormonal therapy.   2. Mildly decreased left ventricular ejection fraction from previous echocardiogram, improved.  She will still need periodic echocardiograms during treatment with trastuzumab. She will be due for repeat ECHO in March.   3. Osteoporosis, she is taking vitamin-D, but not calcium. I had started her on calcium and I recommended placing her on Prolia, but she has poor dentition. She then developed mild hypercalcemia, so calcium was discontinued.  She has decided against treatment for the osteoporosis.   4. 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 I advised her to avoid Tylenol. She does not drink alcohol.   5. Hypercalcemia, improved and long standing.  PTH was normal. We will continue to monitor this.   6. 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   7. Erythematous rash of the dorsum of the bilateral feet which did not improve with topical treatment or fluconazole. We will refer her to dermatology for further evaluation.   She will proceed with a 8th cycle of trastuzumab tomorrow. The patient has opted against endocrine therapy due to the potential side effects, and has declined treatment for her osteoporosis. In view of her persistent rash of the bilateral feet, I will refer her to a dermatologist for further evaluation. We will see her back in 3 weeks with CBC and CMP prior to her 9th cycle.  The goal is to complete a full year of the trastuzumab which would be at the end of March. She would also be due for another ECHO in March. The patient and her daughter understand the plans discussed today and are in agreement with them.  They know to contact our office if she develops issues prior to her next appointment.  I provided 15 minutes of face-to-face time during this this encounter and > 50% was spent counseling as documented under my assessment and plan.    [facility] [address] [place] [address] Dept: [phone] Dept Fax: [phone]   No orders of the defined types were placed in this encounter.     CHIEF COMPLAINT:  CC: Stage IB hormone and HER2 receptor positive right breast cancer  Current Treatment: Adjuvant trastuzumab every 3 weeks   HISTORY OF PRESENT ILLNESS:  Brandy Jacobs is a 88 y.o. female with clinical stage IB (T2 N0 M0) hormone and HER2/neu receptor positive right breast cancer  diagnosed in December 2021. She had a palpable upper right breast mass, so underwent diagnostic imaging. There was a 2.9 cm upper right breast mass highly suspicious for malignancy.  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 Estrogen and progesterone receptors were positive and HER2  positive.  Ki67 was 40%.  Due to the HER2 positivity, we recommended neoadjuvant therapy with trastuzumab, pertuzumab and weekly paclitaxel weekly for 3 cycles. Echocardiogram in December revealed overall normal left ventricular size and function with an ejection fraction of 60 to 65%. She received her first cycle of neoadjuvant trastuzumab/pertuzumab /paclitaxel in January. She underwent an additional stereotactic biopsy of an area in the inferior right breast later in January to guide surgery.  Pathology revealed atypical ductal hyperplasia.  She had difficulty tolerating therapy, mainly due to diarrhea, causing dehydration and hypokalemia, but she also had cytopenias. She completed neoadjuvant therapy in March.   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   She was then referred back to Dr. Lilia Jacobs and underwent lumpectomy on April 11th.  Pathology revealed a residual 2 mm, grade 2, invasive ductal carcinoma with foci of ductal carcinoma in situ and atypical lobular hyperplasia.  Estrogen and progesterone receptors were positive and HER2 positive.  Ki-67 was less than 5%.  As she did have a small residual cancer, adjuvant HER2 targeted therapy with Kadcyla (ado-trastuzumab emtansine) was recommended, for a total of a year of HER2 targeted therapy. 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  She has had difficulty with memory, as well as expression since her stroke, which her daughter feels may have increased since chemotherapy and surgery.  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  Echocardiogram on May 18th revealed normal left ventricular size and function with an ejection fraction between 55 to 60%.  Bone density scan revealed osteoporosis with  a T-score of -3.8 in the femur, previously -2 and a T-score of -3.6 in the forearm, for which she is on vitamin D due to chronically elevated calcium.  She was placed on Kadcyla adjuvant chemotherapy at the end of May and initially tolerated it fairly well.  She had worsening neuropathy with tingling and pain of the bilateral feet and legs.  It was decided to stop the Kadcyla due to increasing toxicities.  The patient declines hormonal therapy or treatment of her osteoporosis due to toxicities and her advanced age and comorbidities. She was placed on single agent trastuzumab every 3 weeks and received her 1st dose on August 25th, with plans to complete a full year of trastuzumab.  Echocardiogram from August 23rd revealed a normal EF between 55 - 60 %. Repeat ECHO from November was stable. Annual mammogram from December 2022 was clear.    INTERVAL HISTORY:  Brandy Jacobs is here for routine follow up prior to an 8th cycle of trastuzumab. She states that she is doing well other than generalized pruritis. Her daughter states that this occurred around the same time last year. I advised that she keep her skin moisturized and she may use low dose Benadryl. She states that the Diflucan did not improve the rash of her feet, therefore, I advised that she discontinue and follow up with a dermatologist. Blood counts and chemistries are unremarkable except  for a BUN of 20 and a calcium of 10.5, which is stable. Her  appetite is good, and she has lost nearly 3 pounds since her last visit.  She denies fever, chills or other signs of infection.  She denies nausea, vomiting, bowel issues, or abdominal pain.  She denies sore throat, cough, dyspnea, or chest pain.  REVIEW OF SYSTEMS:  Review of Systems  Constitutional: Negative.  Negative for appetite change, chills, fatigue, fever and unexpected weight change.  HENT:  Negative.    Eyes: Negative.   Respiratory: Negative.  Negative for chest tightness, cough, hemoptysis, shortness  of breath and wheezing.   Cardiovascular: Negative.  Negative for chest pain, leg swelling and palpitations.  Gastrointestinal: Negative.  Negative for abdominal distention, abdominal pain, blood in stool, constipation, diarrhea, nausea and vomiting.  Endocrine: Negative.   Genitourinary:  Negative for bladder incontinence, difficulty urinating, dysuria, frequency and hematuria.   Musculoskeletal: Negative.  Negative for arthralgias, back pain, flank pain, gait problem and myalgias.  Skin:  Positive for itching (generalized).  Neurological: Negative.  Negative for dizziness, extremity weakness, gait problem, headaches, light-headedness, numbness, seizures and speech difficulty.  Hematological: Negative.   Psychiatric/Behavioral: Negative.  Negative for depression and sleep disturbance. The patient is not nervous/anxious.     VITALS:  Blood pressure 124/60, pulse 69, temperature 98.4 F (36.9 C), temperature source Oral, resp. rate 20, height 5' 3.5" (1.613 m), weight 132 lb 11.2 oz (60.2 kg), SpO2 96 %.  Wt Readings from Last 3 Encounters:  02/11/21 132 lb 11.2 oz (60.2 kg)  01/22/21 136 lb 1.9 oz (61.7 kg)  01/21/21 135 lb 4.8 oz (61.4 kg)    Body mass index is 23.14 kg/m.  Performance status (ECOG): 1 - Symptomatic but completely ambulatory  PHYSICAL EXAM:  Physical Exam Constitutional:      General: She is not in acute distress.    Appearance: Normal appearance. She is normal weight.  HENT:     Head: Normocephalic and atraumatic.  Eyes:     General: No scleral icterus.    Extraocular Movements: Extraocular movements intact.     Conjunctiva/sclera: Conjunctivae normal.     Pupils: Pupils are equal, round, and reactive to light.  Cardiovascular:     Rate and Rhythm: Normal rate and regular rhythm.     Pulses: Normal pulses.     Heart sounds: Normal heart sounds. No murmur heard.   No friction rub. No gallop.  Pulmonary:     Effort: Pulmonary effort is normal. No respiratory  distress.     Breath sounds: Normal breath sounds.  Abdominal:     General: Bowel sounds are normal. There is no distension.     Palpations: Abdomen is soft. There is no hepatomegaly, splenomegaly or mass.     Tenderness: There is no abdominal tenderness.  Musculoskeletal:        General: Normal range of motion.     Cervical back: Normal range of motion and neck supple.     Right lower leg: No edema.     Left lower leg: No edema.  Feet:     Comments: Her left foot is mildly swollen and purplish in color with a scaly pruritis rash of the dorsum of the foot. The right foot has just some mild rash on the dorsum. Lymphadenopathy:     Cervical: No cervical adenopathy.  Skin:    General: Skin is warm and dry.  Neurological:     General: No focal deficit present.  Mental Status: She is alert and oriented to person, place, and time. Mental status is at baseline.  Psychiatric:        Mood and Affect: Mood normal.        Behavior: Behavior normal.        Thought Content: Thought content normal.        Judgment: Judgment normal.    LABS:   CBC Latest Ref Rng & Units 02/11/2021 01/21/2021 12/31/2020  WBC - 6.3 6.3 5.4  Hemoglobin 12.0 - 16.0 12.3 12.0 11.8(A)  Hematocrit 36 - 46 38 37 34(A)  Platelets 150 - 399 177 179 165   CMP Latest Ref Rng & Units 02/11/2021 01/21/2021 12/31/2020  BUN 4 - '21 20 21 ' 27(A)  Creatinine 0.5 - 1.1 0.9 0.8 0.8  Sodium 137 - 147 137 138 139  Potassium 3.4 - 5.3 4.3 4.2 4.1  Chloride 99 - 108 103 104 107  CO2 13 - 22 30(A) 30(A) 26(A)  Calcium 8.7 - 10.7 10.5 10.6 10.3  Total Protein 6.3 - 8.2 g/dL - - -  Alkaline Phos 25 - 125 68 65 73  AST 13 - 35 34 39(A) 41(A)  ALT 7 - 35 '26 25 24    ' STUDIES:  No results found.    HISTORY:   Allergies: No Known Allergies  Current Medications: Current Outpatient Medications  Medication Sig Dispense Refill   acetaminophen (TYLENOL) 500 MG tablet Take 500 mg by mouth at bedtime.     aspirin EC 81 MG tablet  Take 81 mg by mouth daily. Swallow whole.     atorvastatin (LIPITOR) 40 MG tablet Take 40 mg by mouth daily.     dexamethasone (DECADRON) 4 MG tablet Take 1 tablet (4 mg total) by mouth as directed. 3 pills night before and 3 pills morning of first chemo, then 1 pill night before and morning of weekly chemo 30 tablet 2   diphenhydrAMINE (BENADRYL) 25 mg capsule Take 25 mg by mouth at bedtime as needed. At night as needed for itching     fluconazole (DIFLUCAN) 100 MG tablet Take 1 tablet (100 mg total) by mouth daily. 10 tablet 1   losartan (COZAAR) 50 MG tablet Take 50 mg by mouth daily.     Vitamin D3 (VITAMIN D) 25 MCG tablet Take 1,000 Units by mouth daily.     No current facility-administered medications for this visit.   Facility-Administered Medications Ordered in Other Visits  Medication Dose Route Frequency Provider Last Rate Last Admin   acetaminophen (TYLENOL) tablet 650 mg  650 mg Oral Once Derwood Kaplan, MD       diphenhydrAMINE (BENADRYL) capsule 50 mg  50 mg Oral Once Derwood Kaplan, MD       sodium chloride flush (NS) 0.9 % injection 10 mL  10 mL Intracatheter PRN Derwood Kaplan, MD   10 mL at 11/20/20 1410     I, Rita Ohara, am acting as scribe for Derwood Kaplan, MD  I have reviewed this report as typed by the medical scribe, and it is complete and accurate.

## 2021-02-11 ENCOUNTER — Other Ambulatory Visit: Payer: Self-pay

## 2021-02-11 ENCOUNTER — Inpatient Hospital Stay: Payer: Medicare Other

## 2021-02-11 ENCOUNTER — Other Ambulatory Visit: Payer: Self-pay | Admitting: Hematology and Oncology

## 2021-02-11 ENCOUNTER — Encounter: Payer: Self-pay | Admitting: Oncology

## 2021-02-11 ENCOUNTER — Inpatient Hospital Stay: Payer: Medicare Other | Attending: Oncology | Admitting: Oncology

## 2021-02-11 VITALS — BP 124/60 | HR 69 | Temp 98.4°F | Resp 20 | Ht 63.5 in | Wt 132.7 lb

## 2021-02-11 DIAGNOSIS — M810 Age-related osteoporosis without current pathological fracture: Secondary | ICD-10-CM | POA: Diagnosis not present

## 2021-02-11 DIAGNOSIS — G620 Drug-induced polyneuropathy: Secondary | ICD-10-CM | POA: Insufficient documentation

## 2021-02-11 DIAGNOSIS — Z79899 Other long term (current) drug therapy: Secondary | ICD-10-CM | POA: Insufficient documentation

## 2021-02-11 DIAGNOSIS — Z170 Estrogen receptor positive status [ER+]: Secondary | ICD-10-CM | POA: Diagnosis not present

## 2021-02-11 DIAGNOSIS — C50411 Malignant neoplasm of upper-outer quadrant of right female breast: Secondary | ICD-10-CM | POA: Insufficient documentation

## 2021-02-11 DIAGNOSIS — Z5112 Encounter for antineoplastic immunotherapy: Secondary | ICD-10-CM | POA: Insufficient documentation

## 2021-02-11 DIAGNOSIS — R21 Rash and other nonspecific skin eruption: Secondary | ICD-10-CM | POA: Insufficient documentation

## 2021-02-11 LAB — COMPREHENSIVE METABOLIC PANEL
Albumin: 4.1 (ref 3.5–5.0)
Calcium: 10.5 (ref 8.7–10.7)

## 2021-02-11 LAB — BASIC METABOLIC PANEL
BUN: 20 (ref 4–21)
CO2: 30 — AB (ref 13–22)
Chloride: 103 (ref 99–108)
Creatinine: 0.9 (ref 0.5–1.1)
Glucose: 120
Potassium: 4.3 (ref 3.4–5.3)
Sodium: 137 (ref 137–147)

## 2021-02-11 LAB — CBC AND DIFFERENTIAL
HCT: 38 (ref 36–46)
Hemoglobin: 12.3 (ref 12.0–16.0)
Neutrophils Absolute: 4.1
Platelets: 177 (ref 150–399)
WBC: 6.3

## 2021-02-11 LAB — HEPATIC FUNCTION PANEL
ALT: 26 (ref 7–35)
AST: 34 (ref 13–35)
Alkaline Phosphatase: 68 (ref 25–125)
Bilirubin, Total: 1.3

## 2021-02-11 LAB — CBC
MCV: 94 (ref 81–99)
RBC: 4.06 (ref 3.87–5.11)

## 2021-02-11 MED FILL — Trastuzumab-anns For IV Soln 150 MG: INTRAVENOUS | Qty: 17 | Status: AC

## 2021-02-12 ENCOUNTER — Telehealth: Payer: Self-pay

## 2021-02-12 ENCOUNTER — Inpatient Hospital Stay: Payer: Medicare Other

## 2021-02-12 VITALS — BP 125/55 | HR 63 | Temp 98.2°F | Resp 18 | Ht 63.5 in | Wt 134.0 lb

## 2021-02-12 DIAGNOSIS — Z170 Estrogen receptor positive status [ER+]: Secondary | ICD-10-CM | POA: Diagnosis not present

## 2021-02-12 DIAGNOSIS — C50411 Malignant neoplasm of upper-outer quadrant of right female breast: Secondary | ICD-10-CM | POA: Diagnosis present

## 2021-02-12 DIAGNOSIS — Z5112 Encounter for antineoplastic immunotherapy: Secondary | ICD-10-CM | POA: Diagnosis present

## 2021-02-12 DIAGNOSIS — Z79899 Other long term (current) drug therapy: Secondary | ICD-10-CM | POA: Diagnosis not present

## 2021-02-12 DIAGNOSIS — G620 Drug-induced polyneuropathy: Secondary | ICD-10-CM | POA: Diagnosis not present

## 2021-02-12 DIAGNOSIS — R21 Rash and other nonspecific skin eruption: Secondary | ICD-10-CM | POA: Diagnosis not present

## 2021-02-12 MED ORDER — TRASTUZUMAB-ANNS CHEMO 150 MG IV SOLR
6.0000 mg/kg | Freq: Once | INTRAVENOUS | Status: AC
  Administered 2021-02-12: 357 mg via INTRAVENOUS
  Filled 2021-02-12: qty 17

## 2021-02-12 MED ORDER — SODIUM CHLORIDE 0.9% FLUSH
10.0000 mL | INTRAVENOUS | Status: DC | PRN
  Administered 2021-02-12: 10 mL

## 2021-02-12 MED ORDER — ACETAMINOPHEN 325 MG PO TABS: 650.0000 mg | ORAL_TABLET | Freq: Once | ORAL | Status: DC

## 2021-02-12 MED ORDER — DIPHENHYDRAMINE HCL 25 MG PO CAPS: 50.0000 mg | ORAL_CAPSULE | Freq: Once | ORAL | Status: DC

## 2021-02-12 MED ORDER — HEPARIN SOD (PORK) LOCK FLUSH 100 UNIT/ML IV SOLN
500.0000 [IU] | Freq: Once | INTRAVENOUS | Status: AC | PRN
  Administered 2021-02-12: 500 [IU]

## 2021-02-12 MED ORDER — SODIUM CHLORIDE 0.9 % IV SOLN: Freq: Once | INTRAVENOUS | Status: AC

## 2021-02-12 NOTE — Patient Instructions (Signed)
[facility]  Discharge Instructions: Thank you for choosing [facility] to provide your oncology and hematology care.  If you have a lab appointment with the [facility], please go directly to the [facility] and check in at the registration area.   Wear comfortable clothing and clothing appropriate for easy access to any Portacath or PICC line.   We strive to give you quality time with your provider. You may need to reschedule your appointment if you arrive late (15 or more minutes).  Arriving late affects you and other patients whose appointments are after yours.  Also, if you miss three or more appointments without notifying the office, you may be dismissed from the clinic at the providers discretion.      For prescription refill requests, have your pharmacy contact our office and allow 72 hours for refills to be completed.    Today you received the following chemotherapy and/or immunotherapy agents trastuzumab To help prevent nausea and vomiting after your treatment, we encourage you to take your nausea medication as directed.  BELOW ARE SYMPTOMS THAT SHOULD BE REPORTED IMMEDIATELY: *FEVER GREATER THAN 100.4 F (38 C) OR HIGHER *CHILLS OR SWEATING *NAUSEA AND VOMITING THAT IS NOT CONTROLLED WITH YOUR NAUSEA MEDICATION *UNUSUAL SHORTNESS OF BREATH *UNUSUAL BRUISING OR BLEEDING *URINARY PROBLEMS (pain or burning when urinating, or frequent urination) *BOWEL PROBLEMS (unusual diarrhea, constipation, pain near the anus) TENDERNESS IN MOUTH AND THROAT WITH OR WITHOUT PRESENCE OF ULCERS (sore throat, sores in mouth, or a toothache) UNUSUAL RASH, SWELLING OR PAIN  UNUSUAL VAGINAL DISCHARGE OR ITCHING   Items with * indicate a potential emergency and should be followed up as soon as possible or go to the Emergency Department if any problems should occur.  Please show the CHEMOTHERAPY ALERT CARD or IMMUNOTHERAPY ALERT CARD at check-in to the  Emergency Department and triage nurse.  Should you have questions after your visit or need to cancel or reschedule your appointment, please contact [facility]  Dept: [phone]  and follow the prompts.  Office hours are 8:00 a.m. to 4:30 p.m. Monday - Friday. Please note that voicemails left after 4:00 p.m. may not be returned until the following business day.  We are closed weekends and major holidays. You have access to a nurse at all times for urgent questions. Please call the main number to the clinic Dept: [phone] and follow the prompts.  For any non-urgent questions, you may also contact your provider using MyChart. We now offer e-Visits for anyone 74 and older to request care online for non-urgent symptoms. For details visit [email or web address].   Also download the MyChart app! Go to the app store, search "MyChart", open the app, select [facility], and log in with your MyChart username and password.  Due to Covid, a mask is required upon entering the hospital/clinic. If you do not have a mask, one will be given to you upon arrival. For doctor visits, patients may have 1 support person aged 82 or older with them. For treatment visits, patients cannot have anyone with them due to current Covid guidelines and our immunocompromised population.   Trastuzumab injection for infusion What is this medication? TRASTUZUMAB (tras TOO zoo mab) is a monoclonal antibody. It is used to treat breast cancer and stomach cancer. This medicine may be used for other purposes; ask your health care provider or pharmacist if you have questions. COMMON BRAND NAME(S): Herceptin, Donnald Garre What  should I tell my care team before I take this medication? They need to know if you have any of these conditions: heart disease heart failure lung or breathing disease, like asthma an unusual or allergic reaction to trastuzumab, benzyl alcohol, or other  medications, foods, dyes, or preservatives pregnant or trying to get pregnant breast-feeding How should I use this medication? This drug is given as an infusion into a vein. It is administered in a hospital or clinic by a specially trained health care professional. Talk to your pediatrician regarding the use of this medicine in children. This medicine is not approved for use in children. Overdosage: If you think you have taken too much of this medicine contact a poison control center or emergency room at once. NOTE: This medicine is only for you. Do not share this medicine with others. What if I miss a dose? It is important not to miss a dose. Call your doctor or health care professional if you are unable to keep an appointment. What may interact with this medication? This medicine may interact with the following medications: certain types of chemotherapy, such as daunorubicin, doxorubicin, epirubicin, and idarubicin This list may not describe all possible interactions. Give your health care provider a list of all the medicines, herbs, non-prescription drugs, or dietary supplements you use. Also tell them if you smoke, drink alcohol, or use illegal drugs. Some items may interact with your medicine. What should I watch for while using this medication? Visit your doctor for checks on your progress. Report any side effects. Continue your course of treatment even though you feel ill unless your doctor tells you to stop. Call your doctor or health care professional for advice if you get a fever, chills or sore throat, or other symptoms of a cold or flu. Do not treat yourself. Try to avoid being around people who are sick. You may experience fever, chills and shaking during your first infusion. These effects are usually mild and can be treated with other medicines. Report any side effects during the infusion to your health care professional. Fever and chills usually do not happen with later infusions. Do  not become pregnant while taking this medicine or for 7 months after stopping it. Women should inform their doctor if they wish to become pregnant or think they might be pregnant. Women of child-bearing potential will need to have a negative pregnancy test before starting this medicine. There is a potential for serious side effects to an unborn child. Talk to your health care professional or pharmacist for more information. Do not breast-feed an infant while taking this medicine or for 7 months after stopping it. Women must use effective birth control with this medicine. What side effects may I notice from receiving this medication? Side effects that you should report to your doctor or health care professional as soon as possible: allergic reactions like skin rash, itching or hives, swelling of the face, lips, or tongue chest pain or palpitations cough dizziness feeling faint or lightheaded, falls fever general ill feeling or flu-like symptoms signs of worsening heart failure like breathing problems; swelling in your legs and feet unusually weak or tired Side effects that usually do not require medical attention (report to your doctor or health care professional if they continue or are bothersome): bone pain changes in taste diarrhea joint pain nausea/vomiting weight loss This list may not describe all possible side effects. Call your doctor for medical advice about side effects. You may report side effects to FDA  at 1-800-FDA-1088. Where should I keep my medication? This drug is given in a hospital or clinic and will not be stored at home. NOTE: This sheet is a summary. It may not cover all possible information. If you have questions about this medicine, talk to your doctor, pharmacist, or health care provider.  2022 Elsevier/Gold Standard (2016-01-26 00:00:00)

## 2021-02-12 NOTE — Telephone Encounter (Signed)
-----   Message from Derwood Kaplan, MD sent at 02/11/2021  2:31 PM EST ----- Regarding: referral Pls ref to Derm for scaly itchy rash, esp of feet.  Did not respond to antifungal treatment

## 2021-02-12 NOTE — Telephone Encounter (Signed)
Faxed referral to [facility] and [place] on 02/11/21.

## 2021-02-19 ENCOUNTER — Encounter: Payer: Self-pay | Admitting: Oncology

## 2021-03-03 NOTE — Assessment & Plan Note (Addendum)
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  She is status post neoadjuvant trastuzumab/pertuzumab/paclitaxel. She had an excellent response to this, with just a tiny residual invasive carcinoma. She received 4 cycles of adjuvant Kadcyla but this was discontinued due to toxicities. She is therefore receiving single agent trastuzumab and continues to tolerate itwell. Since she has already had pre- and post-op treatments 11 more cycles were recommended to complete 1 year of HER2 targeted therapy. She will proceed with 9th cycle this week.  The patient and her daughter decided against adjuvant hormonal therapy.    She has had a mildly decreased left ventricular ejection fraction on previous echocardiogram, which had improved improved. She will still need periodic echocardiograms during treatment withtrastuzumab. She will be due for repeat echocardiogram in March.  We will plan to see her back in 3 weeks with a CBC, comprehensive metabolic panel and echocardiogram prior to 10th cycle.

## 2021-03-03 NOTE — Progress Notes (Signed)
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  She is status post neoadjuvant trastuzumab/pertuzumab/paclitaxel.  She had an excellent response to this, with just a tiny residual invasive carcinoma. She received 4 cycles of adjuvant Kadcyla but this was discontinued due to toxicities.   She is therefore receiving single agent trastuzumab and continues to tolerate it well.  Since she has already had pre- and post-op treatments 11 more cycles were recommended to complete 1 year of HER2 targeted therapy. She will proceed with 9th cycle this week.  The patient and her daughter decided against adjuvant hormonal therapy.    She has had a mildly decreased left ventricular ejection fraction on previous echocardiogram, which had improved improved.  She will still need periodic echocardiograms during treatment with trastuzumab. She will be due for repeat echocardiogram in March.  We will plan to see her back in 3 weeks with a CBC, comprehensive metabolic panel and echocardiogram prior to 10th cycle.  Osteoporosis We recommended placing her on Prolia, but she has poor dentition, so ultimately decided against treatment for osteoporosis.  She was initially on calcium/vitamin D, but then developed mild hypercalcemia, so calcium was discontinued.   Rash of both feet Erythematous rash of the dorsum of the bilateral feet most consistent with fungal infection. This did not improve with topical antifungal or fluconazole. She was referred to dermatology but has not seen them yet. We will check on the referral.   The patient understands the plans discussed today and is  in agreement with them.  She knows to contact our office if she develops concerns prior to her next appointment.     Marvia Pickles, PA-C  [facility] [address] [place] [address] Dept: [phone] Dept Fax: [phone]   Orders Placed This Encounter  Procedures   CBC and differential    This external order was created through the Results Console.   CBC    This external order was created through the Results Console.   CBC    This order was created through External Result Entry   Basic metabolic panel    This external order was created through the Results Console.   Comprehensive metabolic panel    This external order was created through the Results Console.   Hepatic function panel    This external order was created through the Results Console.   ECHOCARDIOGRAM COMPLETE    Standing Status:   Future    Standing Expiration Date:   03/04/2022    Scheduling Instructions:     RH    Order Specific Question:   Where should this test be performed    Answer:   External    Order Specific Question:   Perflutren DEFINITY (image enhancing agent) should be administered unless hypersensitivity or allergy exist    Answer:   Administer Perflutren    Order Specific Question:   Reason for exam-Echo    Answer:   Chemo  Z09      CHIEF COMPLAINT:  CC: Stage IB hormone and HER2/neu receptor positive breast cancer  Current Treatment: Adjuvant trastuzumab every 3 weeks  HISTORY OF PRESENT  ILLNESS:  Brandy Jacobs is a 88 y.o. 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 She had a palpable upper right breast mass, so underwent diagnostic imaging. There was a 2.9 cm upper right breast mass highly suspicious for malignancy.  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 Estrogen and  progesterone receptors were positive and HER2 positive.  Ki67 was 40%.  Due to the HER2 positivity, we recommended neoadjuvant therapy with trastuzumab, pertuzumab and weekly paclitaxel weekly for 3 cycles. Echocardiogram in December revealed overall normal left ventricular size and function with an ejection fraction of 60 to 65%. She received her first cycle of neoadjuvant trastuzumab/pertuzumab /paclitaxel in January. She underwent an additional stereotactic biopsy of an area in the inferior right breast later in January to guide surgery.  Pathology revealed atypical ductal hyperplasia.  She had difficulty tolerating therapy, mainly due to diarrhea, causing dehydration and hypokalemia, but she also had cytopenias. She completed neoadjuvant therapy in March.   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   She was then referred back to Dr. Lilia Pro and underwent lumpectomy on April 11th.  Pathology revealed a residual 2 mm, grade 2, invasive ductal carcinoma with foci of ductal carcinoma in situ and atypical lobular hyperplasia.  Estrogen and progesterone receptors were positive and HER2 positive.  Ki-67 was less than 5%.  As she did have a small residual cancer, adjuvant HER2 targeted therapy with Kadcyla (ado-trastuzumab emtansine) was recommended, for a total of a year of HER2 targeted therapy. 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  She has had difficulty with memory, as well as expression since her stroke, which her daughter feels may have increased since chemotherapy and surgery.  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  Echocardiogram on May 18th revealed normal left ventricular size and function with an ejection fraction between 55 to 60%.   Bone density scan revealed osteoporosis with a T-score of -3.8 in the femur, previously -2 and a T-score of -3.6 in the forearm, for which she is on vitamin D due to chronically elevated calcium.  She was placed on Kadcyla adjuvant chemotherapy at the end of May and initially tolerated it fairly well.  She had worsening neuropathy with tingling and pain of the bilateral feet and legs.  It was decided to stop the Kadcyla due to increasing toxicities.  The patient declines hormonal therapy or treatment of her osteoporosis due to toxicities and her advanced age and comorbidities. She was placed on single agent trastuzumab every 3 weeks and received her 1st dose on August 25th, with plans to complete a full year of trastuzumab.  Echocardiogram from August 23rd revealed a normal EF between 55 - 60 %. Repeat ECHO from November was stable. Annual mammogram in December 2022 did not reveal any evidence of malignancy.  She has had a rash of her feet which did not improve with fluconazole, so she was referred to dermatology.  INTERVAL HISTORY:  Brandy Jacobs is here today for repeat clinical assessment.  She reports a persistent red itchy rash of the bilateral feet, as well as dryness of her skin.  She has been using Benadryl spray on  her feet which helps the itching.  She denies any changes in her breasts.  She denies fevers or chills. She denies pain. Her appetite is good. Her weight has been stable.  REVIEW OF SYSTEMS:  Review of Systems  Constitutional:  Negative for appetite change, chills, fatigue, fever and unexpected weight change.  HENT:   Negative for lump/mass, mouth sores and sore throat.   Respiratory:  Negative for cough and shortness of breath.   Cardiovascular:  Negative for chest pain and leg swelling.  Gastrointestinal:  Negative for abdominal pain, constipation, diarrhea, nausea and vomiting.  Endocrine: Negative for hot flashes.  Genitourinary:  Negative for difficulty urinating, dysuria, frequency  and hematuria.   Musculoskeletal:  Negative for arthralgias, back pain and myalgias.  Skin:  Positive for itching and rash (Bilateral feet.).  Neurological:  Negative for dizziness and headaches.  Hematological:  Negative for adenopathy. Does not bruise/bleed easily.  Psychiatric/Behavioral:  Negative for depression and sleep disturbance. The patient is not nervous/anxious.     VITALS:  Blood pressure (!) 159/67, pulse 63, temperature 98.2 F (36.8 C), temperature source Oral, resp. rate 20, height 5' 3.5" (1.613 m), weight 133 lb 14.4 oz (60.7 kg), SpO2 98 %.  Wt Readings from Last 3 Encounters:  03/04/21 133 lb 14.4 oz (60.7 kg)  02/12/21 134 lb (60.8 kg)  02/11/21 132 lb 11.2 oz (60.2 kg)    Body mass index is 23.35 kg/m.  Performance status (ECOG): 1 - Symptomatic but completely ambulatory  PHYSICAL EXAM:  Physical Exam Vitals and nursing note reviewed.  Constitutional:      General: She is not in acute distress.    Appearance: Normal appearance.  HENT:     Head: Normocephalic and atraumatic.     Mouth/Throat:     Mouth: Mucous membranes are moist.     Pharynx: Oropharynx is clear. No oropharyngeal exudate or posterior oropharyngeal erythema.  Eyes:     General: No scleral icterus.    Extraocular Movements: Extraocular movements intact.     Conjunctiva/sclera: Conjunctivae normal.     Pupils: Pupils are equal, round, and reactive to light.  Cardiovascular:     Rate and Rhythm: Normal rate and regular rhythm.     Heart sounds: Normal heart sounds. No murmur heard.   No friction rub. No gallop.  Pulmonary:     Effort: Pulmonary effort is normal.     Breath sounds: Normal breath sounds. No wheezing, rhonchi or rales.  Abdominal:     General: There is no distension.     Palpations: Abdomen is soft. There is no hepatomegaly, splenomegaly or mass.     Tenderness: There is no abdominal tenderness.  Musculoskeletal:        General: Normal range of motion.     Cervical  back: Normal range of motion and neck supple. No tenderness.     Right lower leg: No edema.     Left lower leg: No edema.  Lymphadenopathy:     Cervical: No cervical adenopathy.     Upper Body:     Right upper body: No supraclavicular or axillary adenopathy.     Left upper body: No supraclavicular or axillary adenopathy.     Lower Body: No right inguinal adenopathy. No left inguinal adenopathy.  Skin:    General: Skin is warm and dry.     Coloration: Skin is not jaundiced.     Findings: Rash (Persistent erythematous rash of the bilateral) present.  Neurological:  Mental Status: She is alert and oriented to person, place, and time.     Cranial Nerves: No cranial nerve deficit.  Psychiatric:        Mood and Affect: Mood normal.        Behavior: Behavior normal.        Thought Content: Thought content normal.    LABS:   CBC Latest Ref Rng & Units 03/04/2021 02/11/2021 01/21/2021  WBC - 5.6 6.3 6.3  Hemoglobin 12.0 - 16.0 12.1 12.3 12.0  Hematocrit 36 - 46 35(A) 38 37  Platelets 150 - 399 179 177 179   CMP Latest Ref Rng & Units 03/04/2021 02/11/2021 01/21/2021  BUN 4 - '21 19 20 21  ' Creatinine 0.5 - 1.1 0.8 0.9 0.8  Sodium 137 - 147 141 137 138  Potassium 3.4 - 5.3 4.3 4.3 4.2  Chloride 99 - 108 107 103 104  CO2 13 - 22 29(A) 30(A) 30(A)  Calcium 8.7 - 10.7 10.4 10.5 10.6  Total Protein 6.3 - 8.2 g/dL - - -  Alkaline Phos 25 - 125 65 68 65  AST 13 - 35 36(A) 34 39(A)  ALT 7 - 35 '23 26 25     ' No results found for: CEA1 / No results found for: CEA1 No results found for: PSA1 No results found for: WUJ811 No results found for: BJY782  No results found for: TOTALPROTELP, ALBUMINELP, A1GS, A2GS, BETS, BETA2SER, GAMS, MSPIKE, SPEI No results found for: TIBC, FERRITIN, IRONPCTSAT No results found for: LDH  STUDIES:  No results found.    HISTORY:   Past Medical History:  Diagnosis Date   Arthritis    osteoarthritis   Breast cancer ([place])    Diabetes mellitus without  complication ([place])    not on medication   History of CVA (cerebrovascular accident)    Hypertension    Multiple thyroid nodules    benign    Past Surgical History:  Procedure Laterality Date   BIOPSY THYROID     benign   BREAST BIOPSY     BREAST LUMPECTOMY W/ NEEDLE LOCALIZATION Right 05/04/2020    Family History  Problem Relation Age of Onset   Colon cancer Father        23s   Breast cancer Sister        10-60   Colon cancer Brother        20s   Prostate cancer Brother        late 72s   Breast cancer Half-Sister        late 22s early 29s    Social History:  reports that she has never smoked. She has never used smokeless tobacco. She reports that she does not currently use alcohol. She reports that she does not use drugs.The patient is accompanied by her daughter family feeling limited care however described before going to the today.  Allergies: No Known Allergies  Current Medications: Current Outpatient Medications  Medication Sig Dispense Refill   acetaminophen (TYLENOL) 500 MG tablet Take 500 mg by mouth at bedtime.     aspirin EC 81 MG tablet Take 81 mg by mouth daily. Swallow whole.     atorvastatin (LIPITOR) 40 MG tablet Take 40 mg by mouth daily.     dexamethasone (DECADRON) 4 MG tablet Take 1 tablet (4 mg total) by mouth as directed. 3 pills night before and 3 pills morning of first chemo, then 1 pill night before and morning of weekly chemo (Patient not taking: Reported on  03/04/2021) 30 tablet 2   diphenhydrAMINE (BENADRYL) 25 mg capsule Take 25 mg by mouth at bedtime as needed. At night as needed for itching     fluconazole (DIFLUCAN) 100 MG tablet Take 1 tablet (100 mg total) by mouth daily. (Patient not taking: Reported on 03/04/2021) 10 tablet 1   losartan (COZAAR) 50 MG tablet Take 50 mg by mouth daily.     Vitamin D3 (VITAMIN D) 25 MCG tablet Take 1,000 Units by mouth daily.     No current facility-administered medications for this visit.    Facility-Administered Medications Ordered in Other Visits  Medication Dose Route Frequency Provider Last Rate Last Admin   acetaminophen (TYLENOL) tablet 650 mg  650 mg Oral Once Derwood Kaplan, MD       diphenhydrAMINE (BENADRYL) capsule 50 mg  50 mg Oral Once Derwood Kaplan, MD       sodium chloride flush (NS) 0.9 % injection 10 mL  10 mL Intracatheter PRN Derwood Kaplan, MD   10 mL at 11/20/20 1410

## 2021-03-03 NOTE — Assessment & Plan Note (Signed)
We recommended placing her on Prolia, but she has poor dentition, so ultimately decided against treatment for osteoporosis.  She was initially on calcium/vitamin D, but then developed mild hypercalcemia, so calcium was discontinued.

## 2021-03-04 ENCOUNTER — Encounter: Payer: Self-pay | Admitting: Hematology and Oncology

## 2021-03-04 ENCOUNTER — Other Ambulatory Visit: Payer: Self-pay

## 2021-03-04 ENCOUNTER — Inpatient Hospital Stay: Payer: Medicare Other | Attending: Oncology

## 2021-03-04 ENCOUNTER — Inpatient Hospital Stay: Payer: Medicare Other | Admitting: Hematology and Oncology

## 2021-03-04 ENCOUNTER — Telehealth: Payer: Self-pay | Admitting: Hematology and Oncology

## 2021-03-04 VITALS — BP 159/67 | HR 63 | Temp 98.2°F | Resp 20 | Ht 63.5 in | Wt 133.9 lb

## 2021-03-04 DIAGNOSIS — M810 Age-related osteoporosis without current pathological fracture: Secondary | ICD-10-CM | POA: Insufficient documentation

## 2021-03-04 DIAGNOSIS — Z9221 Personal history of antineoplastic chemotherapy: Secondary | ICD-10-CM | POA: Insufficient documentation

## 2021-03-04 DIAGNOSIS — R21 Rash and other nonspecific skin eruption: Secondary | ICD-10-CM | POA: Insufficient documentation

## 2021-03-04 DIAGNOSIS — C50411 Malignant neoplasm of upper-outer quadrant of right female breast: Secondary | ICD-10-CM

## 2021-03-04 DIAGNOSIS — T451X5D Adverse effect of antineoplastic and immunosuppressive drugs, subsequent encounter: Secondary | ICD-10-CM

## 2021-03-04 DIAGNOSIS — Z170 Estrogen receptor positive status [ER+]: Secondary | ICD-10-CM

## 2021-03-04 DIAGNOSIS — Z79899 Other long term (current) drug therapy: Secondary | ICD-10-CM | POA: Insufficient documentation

## 2021-03-04 DIAGNOSIS — Z5112 Encounter for antineoplastic immunotherapy: Secondary | ICD-10-CM | POA: Insufficient documentation

## 2021-03-04 LAB — CBC AND DIFFERENTIAL
HCT: 35 — AB (ref 36–46)
Hemoglobin: 12.1 (ref 12.0–16.0)
Neutrophils Absolute: 3.42
Platelets: 179 (ref 150–399)
WBC: 5.6

## 2021-03-04 LAB — BASIC METABOLIC PANEL
BUN: 19 (ref 4–21)
CO2: 29 — AB (ref 13–22)
Chloride: 107 (ref 99–108)
Creatinine: 0.8 (ref 0.5–1.1)
Glucose: 113
Potassium: 4.3 (ref 3.4–5.3)
Sodium: 141 (ref 137–147)

## 2021-03-04 LAB — CBC
MCV: 94 (ref 81–99)
RBC: 3.74 — AB (ref 3.87–5.11)

## 2021-03-04 LAB — HEPATIC FUNCTION PANEL
ALT: 23 (ref 7–35)
AST: 36 — AB (ref 13–35)
Alkaline Phosphatase: 65 (ref 25–125)
Bilirubin, Total: 1.4

## 2021-03-04 LAB — COMPREHENSIVE METABOLIC PANEL
Albumin: 4 (ref 3.5–5.0)
Calcium: 10.4 (ref 8.7–10.7)

## 2021-03-04 MED FILL — Trastuzumab-anns For IV Soln 150 MG: INTRAVENOUS | Qty: 17 | Status: AC

## 2021-03-04 NOTE — Assessment & Plan Note (Signed)
Erythematous rash of the dorsum of the bilateral feet most consistent with fungal infection. This did not improve with topical antifungal or fluconazole. She was referred to dermatology but has not seen them yet. We will check on the referral.

## 2021-03-04 NOTE — Telephone Encounter (Signed)
Per 03/04/21 los next appt scheduled and confirmed with patient °

## 2021-03-05 ENCOUNTER — Inpatient Hospital Stay: Payer: Medicare Other

## 2021-03-05 VITALS — BP 137/65 | HR 62 | Temp 97.6°F | Resp 18 | Ht 63.5 in | Wt 134.0 lb

## 2021-03-05 DIAGNOSIS — C50411 Malignant neoplasm of upper-outer quadrant of right female breast: Secondary | ICD-10-CM

## 2021-03-05 DIAGNOSIS — Z170 Estrogen receptor positive status [ER+]: Secondary | ICD-10-CM | POA: Diagnosis not present

## 2021-03-05 DIAGNOSIS — Z79899 Other long term (current) drug therapy: Secondary | ICD-10-CM | POA: Diagnosis not present

## 2021-03-05 DIAGNOSIS — Z5112 Encounter for antineoplastic immunotherapy: Secondary | ICD-10-CM | POA: Diagnosis not present

## 2021-03-05 DIAGNOSIS — Z9221 Personal history of antineoplastic chemotherapy: Secondary | ICD-10-CM | POA: Diagnosis not present

## 2021-03-05 DIAGNOSIS — R21 Rash and other nonspecific skin eruption: Secondary | ICD-10-CM | POA: Diagnosis not present

## 2021-03-05 DIAGNOSIS — M810 Age-related osteoporosis without current pathological fracture: Secondary | ICD-10-CM | POA: Diagnosis not present

## 2021-03-05 MED ORDER — DIPHENHYDRAMINE HCL 25 MG PO CAPS: 50.0000 mg | ORAL_CAPSULE | Freq: Once | ORAL | Status: DC

## 2021-03-05 MED ORDER — TRASTUZUMAB-ANNS CHEMO 150 MG IV SOLR
6.0000 mg/kg | Freq: Once | INTRAVENOUS | Status: AC
  Administered 2021-03-05: 357 mg via INTRAVENOUS
  Filled 2021-03-05: qty 17

## 2021-03-05 MED ORDER — SODIUM CHLORIDE 0.9 % IV SOLN: Freq: Once | INTRAVENOUS | Status: AC

## 2021-03-05 MED ORDER — ACETAMINOPHEN 325 MG PO TABS: 650.0000 mg | ORAL_TABLET | Freq: Once | ORAL | Status: DC

## 2021-03-05 MED ORDER — SODIUM CHLORIDE 0.9% FLUSH
10.0000 mL | INTRAVENOUS | Status: DC | PRN
  Administered 2021-03-05: 10 mL

## 2021-03-05 MED ORDER — HEPARIN SOD (PORK) LOCK FLUSH 100 UNIT/ML IV SOLN
500.0000 [IU] | Freq: Once | INTRAVENOUS | Status: AC | PRN
  Administered 2021-03-05: 500 [IU]

## 2021-03-05 NOTE — Progress Notes (Signed)
1424: PT STABLE AT TIME OF DISCHARGE  

## 2021-03-05 NOTE — Patient Instructions (Signed)
[facility] [place]  Discharge Instructions: Thank you for choosing [facility] to provide your oncology and hematology care.  If you have a lab appointment with the Cancer Center, please go directly to the Cancer Center and check in at the registration area.   Wear comfortable clothing and clothing appropriate for easy access to any Portacath or PICC line.   We strive to give you quality time with your provider. You may need to reschedule your appointment if you arrive late (15 or more minutes).  Arriving late affects you and other patients whose appointments are after yours.  Also, if you miss three or more appointments without notifying the office, you may be dismissed from the clinic at the provider's discretion.      For prescription refill requests, have your pharmacy contact our office and allow 72 hours for refills to be completed.    Today you received the following chemotherapy and/or immunotherapy agents Trastuzumab     To help prevent nausea and vomiting after your treatment, we encourage you to take your nausea medication as directed.  BELOW ARE SYMPTOMS THAT SHOULD BE REPORTED IMMEDIATELY: *FEVER GREATER THAN 100.4 F (38 C) OR HIGHER *CHILLS OR SWEATING *NAUSEA AND VOMITING THAT IS NOT CONTROLLED WITH YOUR NAUSEA MEDICATION *UNUSUAL SHORTNESS OF BREATH *UNUSUAL BRUISING OR BLEEDING *URINARY PROBLEMS (pain or burning when urinating, or frequent urination) *BOWEL PROBLEMS (unusual diarrhea, constipation, pain near the anus) TENDERNESS IN MOUTH AND THROAT WITH OR WITHOUT PRESENCE OF ULCERS (sore throat, sores in mouth, or a toothache) UNUSUAL RASH, SWELLING OR PAIN  UNUSUAL VAGINAL DISCHARGE OR ITCHING   Items with * indicate a potential emergency and should be followed up as soon as possible or go to the Emergency Department if any problems should occur.  Please show the CHEMOTHERAPY ALERT CARD or IMMUNOTHERAPY ALERT CARD at check-in to the  Emergency Department and triage nurse.  Should you have questions after your visit or need to cancel or reschedule your appointment, please contact [facility] [place]  Dept: [phone]  and follow the prompts.  Office hours are 8:00 a.m. to 4:30 p.m. Monday - Friday. Please note that voicemails left after 4:00 p.m. may not be returned until the following business day.  We are closed weekends and major holidays. You have access to a nurse at all times for urgent questions. Please call the main number to the clinic Dept: [phone] and follow the prompts.  For any non-urgent questions, you may also contact your provider using MyChart. We now offer e-Visits for anyone 18 and older to request care online for non-urgent symptoms. For details visit [email or web address].   Also download the MyChart app! Go to the app store, search "MyChart", open the app, select [facility], and log in with your MyChart username and password.  Due to Covid, a mask is required upon entering the hospital/clinic. If you do not have a mask, one will be given to you upon arrival. For doctor visits, patients may have 1 support person aged 18 or older with them. For treatment visits, patients cannot have anyone with them due to current Covid guidelines and our immunocompromised population.    

## 2021-03-08 ENCOUNTER — Encounter: Payer: Self-pay | Admitting: Oncology

## 2021-03-19 NOTE — Progress Notes (Signed)
[facility] [address] [place],  [place]  [address] [phone]  Clinic Day:  03/25/2021  Referring physician: Guadalupe Maple, MD  This document serves as a record of services personally performed by Hosie Poisson, MD. It was created on their behalf by [facility], a trained medical scribe. The creation of this record is based on the scribe's personal observations and the provider's statements to them.  ASSESSMENT & PLAN:   Assessment & Plan: 1. 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  She had an excellent response to this, with just a tiny residual invasive carcinoma. She received 4 cycles of adjuvant Kadcyla and this was discontinued due to toxicities.   She has started single agent trastuzumab and is tolerating it well.  Since she has already had pre- and post-op treatments, she will take 11 more doses to complete 1 year.  The patient and her daughter state that after much discussion, she does not wish to take adjuvant hormonal therapy.   2. Mildly decreased left ventricular ejection fraction from previous echocardiogram, improved.  She will still need periodic echocardiograms during treatment with trastuzumab.    3. Osteoporosis, she is taking vitamin-D, but not calcium. I had started her on calcium and I recommended placing her on Prolia, but she has poor dentition. She then developed mild hypercalcemia, so calcium was discontinued.  She has decided against treatment for the osteoporosis.   4. 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 I advised her to avoid Tylenol. She does not drink alcohol.   5. Hypercalcemia, improved and long standing.  PTH was normal. We will continue to monitor this.   6. 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    7. Erythematous rash of the  dorsum of the bilateral feet which did not improve with topical treatment or fluconazole. We referred her to dermatology for further evaluation and this was diagnosed as eczema.   She will proceed with a 10th cycle of trastuzumab tomorrow. The patient has opted against endocrine therapy due to the potential side effects, and has declined treatment for her osteoporosis. We will see her back in 3 weeks with CBC and CMP prior to her 11th and final cycle. After that we can go to routine follow up every 3 months. The patient and her daughter understand the plans discussed today and are in agreement with them.  They know to contact our office if she develops issues prior to her next appointment.  I provided 15 minutes of face-to-face time during this this encounter and > 50% was spent counseling as documented under my assessment and plan.    [facility] [address] [place] [address] Dept: [phone] Dept Fax: [phone]   No orders of the defined types were placed in this encounter.     CHIEF COMPLAINT:  CC: Stage IB hormone and HER2/neu receptor positive breast cancer  Current Treatment: Adjuvant trastuzumab every 3 weeks  HISTORY OF PRESENT ILLNESS:  Brandy Jacobs is a 88 y.o. 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 She had a palpable upper right breast mass, so underwent diagnostic imaging. There was a 2.9 cm upper right breast mass highly suspicious for malignancy.  Ultrasound guided biopsy revealed invasive ductal carcinoma,  grade 2/3,  as well as DCIS with central necrosis. Estrogen and progesterone receptors were positive and HER2 positive.  Ki67 was 40%.  Due to the HER2 positivity, we recommended neoadjuvant therapy with trastuzumab, pertuzumab and weekly paclitaxel weekly for 3 cycles. Echocardiogram in December  revealed overall normal left ventricular size and function with an ejection fraction of 60 to 65%. She received her first cycle of neoadjuvant trastuzumab/pertuzumab /paclitaxel in January. She underwent an additional stereotactic biopsy of an area in the inferior right breast later in January to guide surgery.  Pathology revealed atypical ductal hyperplasia.  She had difficulty tolerating therapy, mainly due to diarrhea, causing dehydration and hypokalemia, but she also had cytopenias. She completed neoadjuvant therapy in March.   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   She was then referred back to Dr. Lilia Pro and underwent lumpectomy on April 11th.  Pathology revealed a residual 2 mm, grade 2, invasive ductal carcinoma with foci of ductal carcinoma in situ and atypical lobular hyperplasia.  Estrogen and progesterone receptors were positive and HER2 positive.  Ki-67 was less than 5%.  As she did have a small residual cancer, adjuvant HER2 targeted therapy with Kadcyla (ado-trastuzumab emtansine) was recommended, for a total of a year of HER2 targeted therapy. 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  She has had difficulty with memory, as well as expression since her stroke, which her daughter feels may have increased since chemotherapy and surgery.  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  Echocardiogram on May 18th revealed normal left ventricular size and function with an ejection fraction between 55 to 60%.  Bone density scan revealed osteoporosis with a T-score of -3.8 in the femur, previously -2 and a T-score of -3.6 in the forearm, for which she is on vitamin D due to chronically elevated calcium.  She was placed on Kadcyla adjuvant  chemotherapy at the end of May and initially tolerated it fairly well.  She had worsening neuropathy with tingling and pain of the bilateral feet and legs.  It was decided to stop the Kadcyla due to increasing toxicities.  The patient declines hormonal therapy or treatment of her osteoporosis due to toxicities and her advanced age and comorbidities. She was placed on single agent trastuzumab every 3 weeks and received her 1st dose on August 25th, with plans to complete a full year of trastuzumab.  Echocardiogram from August 23rd revealed a normal EF between 55 - 60 %. Repeat ECHO from November was stable. Annual mammogram in December 2022 did not reveal any evidence of malignancy.  She has had a rash of her feet which did not improve with fluconazole, so she was referred to dermatology.  INTERVAL HISTORY:  Machelle is here for routine follow up prior to a 10th cycle of trastuzumab. She states that she is doing well and denies complaints. She states that the rash of her foot has been diagnosed as eczema and has been prescribed a topical cream with improvement. Blood counts and chemistries are unremarkable except for a BUN of 20, a calcium of 10.6, stable and a bilirubin of 1.8. Her  appetite is good, and she has lost 2 pounds since her last visit.  She denies fever, chills or other signs of infection.  She denies nausea,  vomiting, bowel issues, or abdominal pain.  She denies sore throat, cough, dyspnea, or chest pain.  REVIEW OF SYSTEMS:  Review of Systems  Constitutional: Negative.  Negative for appetite change, chills, fatigue, fever and unexpected weight change.  HENT:  Negative.    Eyes: Negative.   Respiratory: Negative.  Negative for chest tightness, cough, hemoptysis, shortness of breath and wheezing.   Cardiovascular: Negative.  Negative for chest pain, leg swelling and palpitations.  Gastrointestinal: Negative.  Negative for abdominal distention, abdominal pain, blood in stool, constipation,  diarrhea, nausea and vomiting.  Endocrine: Negative.   Genitourinary: Negative.  Negative for difficulty urinating, dysuria, frequency and hematuria.   Musculoskeletal: Negative.  Negative for arthralgias, back pain, flank pain, gait problem and myalgias.  Skin: Negative.   Neurological: Negative.  Negative for dizziness, extremity weakness, gait problem, headaches, light-headedness, numbness, seizures and speech difficulty.  Hematological: Negative.   Psychiatric/Behavioral: Negative.  Negative for depression and sleep disturbance. The patient is not nervous/anxious.     VITALS:  Blood pressure 138/84, pulse 61, temperature 98.1 F (36.7 C), temperature source Oral, resp. rate 20, height 5' 3.5" (1.613 m), weight 132 lb (59.9 kg), SpO2 98 %.  Wt Readings from Last 3 Encounters:  03/25/21 132 lb (59.9 kg)  03/05/21 134 lb (60.8 kg)  03/04/21 133 lb 14.4 oz (60.7 kg)    Body mass index is 23.02 kg/m.  Performance status (ECOG): 0 - Asymptomatic  PHYSICAL EXAM:  Physical Exam Constitutional:      General: She is not in acute distress.    Appearance: Normal appearance. She is normal weight.  HENT:     Head: Normocephalic and atraumatic.  Eyes:     General: No scleral icterus.    Extraocular Movements: Extraocular movements intact.     Conjunctiva/sclera: Conjunctivae normal.     Pupils: Pupils are equal, round, and reactive to light.  Cardiovascular:     Rate and Rhythm: Normal rate and regular rhythm.     Pulses: Normal pulses.     Heart sounds: Normal heart sounds. No murmur heard.   No friction rub. No gallop.  Pulmonary:     Effort: Pulmonary effort is normal. No respiratory distress.     Breath sounds: Normal breath sounds.  Abdominal:     General: Bowel sounds are normal. There is no distension.     Palpations: Abdomen is soft. There is no hepatomegaly, splenomegaly or mass.     Tenderness: There is no abdominal tenderness.  Musculoskeletal:        General: Normal  range of motion.     Cervical back: Normal range of motion and neck supple.     Right lower leg: No edema.     Left lower leg: No edema.  Lymphadenopathy:     Cervical: No cervical adenopathy.  Skin:    General: Skin is warm and dry.  Neurological:     General: No focal deficit present.     Mental Status: She is alert and oriented to person, place, and time. Mental status is at baseline.  Psychiatric:        Mood and Affect: Mood normal.        Behavior: Behavior normal.        Thought Content: Thought content normal.        Judgment: Judgment normal.    LABS:   CBC Latest Ref Rng & Units 03/25/2021 03/04/2021 02/11/2021  WBC - 5.0 5.6 6.3  Hemoglobin 12.0 - 16.0 12.7 12.1  12.3  Hematocrit 36 - 46 37 35(A) 38  Platelets 150 - 399 171 179 177   CMP Latest Ref Rng & Units 03/25/2021 03/04/2021 02/11/2021  BUN 4 - _0 Creatinine 0.5 - 1.1 0.8 0.8 0.9  Sodium 137 - 147 141 141 137  Potassium 3.4 - 5.3 4.4 4.3 4.3  Chloride 99 - 108 106 107 103  CO2 13 - 22 29(A) 29(A) 30(A)  Calcium 8.7 - 10.7 10.6 10.4 10.5  Total Protein 6.3 - 8.2 g/dL - - -  Alkaline Phos 25 - 125 65 65 68  AST 13 - 35 35 36(A) 34  ALT 7 - 35 _1 STUDIES:  No results found.    HISTORY:   Allergies: No Known Allergies  Current Medications: Current Outpatient Medications  Medication Sig Dispense Refill   acetaminophen (TYLENOL) 500 MG tablet Take 500 mg by mouth at bedtime.     aspirin EC 81 MG tablet Take 81 mg by mouth daily. Swallow whole.     atorvastatin (LIPITOR) 40 MG tablet Take 40 mg by mouth daily.     dexamethasone (DECADRON) 4 MG tablet Take 1 tablet (4 mg total) by mouth as directed. 3 pills night before and 3 pills morning of first chemo, then 1 pill night before and morning of weekly chemo (Patient not taking: Reported on 03/04/2021) 30 tablet 2   diphenhydrAMINE (BENADRYL) 25 mg capsule Take 25 mg by mouth at bedtime as needed. At night as needed for itching     fluconazole  (DIFLUCAN) 100 MG tablet Take 1 tablet (100 mg total) by mouth daily. (Patient not taking: Reported on 03/04/2021) 10 tablet 1   losartan (COZAAR) 50 MG tablet Take 50 mg by mouth daily.     triamcinolone cream (KENALOG) 0.1 % SMARTSIG:1 Application Topical 2-3 Times Daily     Vitamin D3 (VITAMIN D) 25 MCG tablet Take 1,000 Units by mouth daily.     No current facility-administered medications for this visit.   Facility-Administered Medications Ordered in Other Visits  Medication Dose Route Frequency Provider Last Rate Last Admin   acetaminophen (TYLENOL) tablet 650 mg  650 mg Oral Once Derwood Kaplan, MD       diphenhydrAMINE (BENADRYL) capsule 50 mg  50 mg Oral Once Derwood Kaplan, MD       sodium chloride flush (NS) 0.9 % injection 10 mL  10 mL Intracatheter PRN Derwood Kaplan, MD   10 mL at 11/20/20 1410    I, Rita Ohara, am acting as scribe for Derwood Kaplan, MD  I have reviewed this report as typed by the medical scribe, and it is complete and accurate.

## 2021-03-24 DIAGNOSIS — I361 Nonrheumatic tricuspid (valve) insufficiency: Secondary | ICD-10-CM

## 2021-03-25 ENCOUNTER — Encounter: Payer: Self-pay | Admitting: Oncology

## 2021-03-25 ENCOUNTER — Inpatient Hospital Stay: Payer: Medicare Other

## 2021-03-25 ENCOUNTER — Other Ambulatory Visit: Payer: Self-pay

## 2021-03-25 ENCOUNTER — Inpatient Hospital Stay: Payer: Medicare Other | Attending: Oncology | Admitting: Oncology

## 2021-03-25 ENCOUNTER — Other Ambulatory Visit: Payer: Self-pay | Admitting: Hematology and Oncology

## 2021-03-25 VITALS — BP 138/84 | HR 61 | Temp 98.1°F | Resp 20 | Ht 63.5 in | Wt 132.0 lb

## 2021-03-25 DIAGNOSIS — M810 Age-related osteoporosis without current pathological fracture: Secondary | ICD-10-CM | POA: Insufficient documentation

## 2021-03-25 DIAGNOSIS — Z5112 Encounter for antineoplastic immunotherapy: Secondary | ICD-10-CM | POA: Insufficient documentation

## 2021-03-25 DIAGNOSIS — C50411 Malignant neoplasm of upper-outer quadrant of right female breast: Secondary | ICD-10-CM | POA: Diagnosis not present

## 2021-03-25 DIAGNOSIS — Z79899 Other long term (current) drug therapy: Secondary | ICD-10-CM | POA: Insufficient documentation

## 2021-03-25 DIAGNOSIS — Z170 Estrogen receptor positive status [ER+]: Secondary | ICD-10-CM | POA: Insufficient documentation

## 2021-03-25 LAB — HEPATIC FUNCTION PANEL
ALT: 24 (ref 7–35)
AST: 35 (ref 13–35)
Alkaline Phosphatase: 65 (ref 25–125)
Bilirubin, Total: 1.8

## 2021-03-25 LAB — BASIC METABOLIC PANEL
BUN: 20 (ref 4–21)
CO2: 29 — AB (ref 13–22)
Chloride: 106 (ref 99–108)
Creatinine: 0.8 (ref 0.5–1.1)
Glucose: 111
Potassium: 4.4 (ref 3.4–5.3)
Sodium: 141 (ref 137–147)

## 2021-03-25 LAB — CBC: RBC: 3.96 (ref 3.87–5.11)

## 2021-03-25 LAB — CBC AND DIFFERENTIAL
HCT: 37 (ref 36–46)
Hemoglobin: 12.7 (ref 12.0–16.0)
Neutrophils Absolute: 3
Platelets: 171 (ref 150–399)
WBC: 5

## 2021-03-25 LAB — COMPREHENSIVE METABOLIC PANEL
Albumin: 4 (ref 3.5–5.0)
Calcium: 10.6 (ref 8.7–10.7)

## 2021-03-25 MED FILL — Trastuzumab-anns For IV Soln 150 MG: INTRAVENOUS | Qty: 17 | Status: AC

## 2021-03-26 ENCOUNTER — Inpatient Hospital Stay: Payer: Medicare Other

## 2021-03-26 VITALS — BP 147/65 | HR 55 | Temp 98.3°F | Resp 18 | Ht 64.57 in | Wt 134.0 lb

## 2021-03-26 DIAGNOSIS — Z5112 Encounter for antineoplastic immunotherapy: Secondary | ICD-10-CM | POA: Diagnosis not present

## 2021-03-26 DIAGNOSIS — C50411 Malignant neoplasm of upper-outer quadrant of right female breast: Secondary | ICD-10-CM | POA: Diagnosis present

## 2021-03-26 DIAGNOSIS — Z170 Estrogen receptor positive status [ER+]: Secondary | ICD-10-CM | POA: Diagnosis not present

## 2021-03-26 DIAGNOSIS — M810 Age-related osteoporosis without current pathological fracture: Secondary | ICD-10-CM | POA: Diagnosis not present

## 2021-03-26 DIAGNOSIS — Z79899 Other long term (current) drug therapy: Secondary | ICD-10-CM | POA: Diagnosis not present

## 2021-03-26 MED ORDER — ACETAMINOPHEN 325 MG PO TABS: 650.0000 mg | ORAL_TABLET | Freq: Once | ORAL | Status: DC

## 2021-03-26 MED ORDER — TRASTUZUMAB-ANNS CHEMO 150 MG IV SOLR
6.0000 mg/kg | Freq: Once | INTRAVENOUS | Status: AC
  Administered 2021-03-26: 357 mg via INTRAVENOUS
  Filled 2021-03-26: qty 17

## 2021-03-26 MED ORDER — SODIUM CHLORIDE 0.9 % IV SOLN: Freq: Once | INTRAVENOUS | Status: AC

## 2021-03-26 MED ORDER — HEPARIN SOD (PORK) LOCK FLUSH 100 UNIT/ML IV SOLN
500.0000 [IU] | Freq: Once | INTRAVENOUS | Status: AC | PRN
  Administered 2021-03-26: 500 [IU]

## 2021-03-26 MED ORDER — DIPHENHYDRAMINE HCL 25 MG PO CAPS: 50.0000 mg | ORAL_CAPSULE | Freq: Once | ORAL | Status: DC

## 2021-03-26 MED ORDER — SODIUM CHLORIDE 0.9% FLUSH
10.0000 mL | INTRAVENOUS | Status: DC | PRN
  Administered 2021-03-26 (×2): 10 mL

## 2021-03-26 NOTE — Patient Instructions (Signed)
[facility]  Discharge Instructions: ?Thank you for choosing [facility] to provide your oncology and hematology care.  ?If you have a lab appointment with the [facility], please go directly to the [facility] and check in at the registration area. ?  ?Wear comfortable clothing and clothing appropriate for easy access to any Portacath or PICC line.  ? ?We strive to give you quality time with your provider. You may need to reschedule your appointment if you arrive late (15 or more minutes).  Arriving late affects you and other patients whose appointments are after yours.  Also, if you miss three or more appointments without notifying the office, you may be dismissed from the clinic at the provider?s discretion.    ?  ?For prescription refill requests, have your pharmacy contact our office and allow 72 hours for refills to be completed.   ? ?Today you received the following chemotherapy and/or immunotherapy agents trastuzumab    ?  ?To help prevent nausea and vomiting after your treatment, we encourage you to take your nausea medication as directed. ? ?BELOW ARE SYMPTOMS THAT SHOULD BE REPORTED IMMEDIATELY: ?*FEVER GREATER THAN 100.4 F (38 ?C) OR HIGHER ?*CHILLS OR SWEATING ?*NAUSEA AND VOMITING THAT IS NOT CONTROLLED WITH YOUR NAUSEA MEDICATION ?*UNUSUAL SHORTNESS OF BREATH ?*UNUSUAL BRUISING OR BLEEDING ?*URINARY PROBLEMS (pain or burning when urinating, or frequent urination) ?*BOWEL PROBLEMS (unusual diarrhea, constipation, pain near the anus) ?TENDERNESS IN MOUTH AND THROAT WITH OR WITHOUT PRESENCE OF ULCERS (sore throat, sores in mouth, or a toothache) ?UNUSUAL RASH, SWELLING OR PAIN  ?UNUSUAL VAGINAL DISCHARGE OR ITCHING  ? ?Items with * indicate a potential emergency and should be followed up as soon as possible or go to the Emergency Department if any problems should occur. ? ?Please show the CHEMOTHERAPY ALERT CARD or IMMUNOTHERAPY ALERT CARD at check-in to the  Emergency Department and triage nurse. ? ?Should you have questions after your visit or need to cancel or reschedule your appointment, please contact [facility]  Dept: [phone]  and follow the prompts.  Office hours are 8:00 a.m. to 4:30 p.m. Monday - Friday. Please note that voicemails left after 4:00 p.m. may not be returned until the following business day.  We are closed weekends and major holidays. You have access to a nurse at all times for urgent questions. Please call the main number to the clinic Dept: [phone] and follow the prompts. ? ?For any non-urgent questions, you may also contact your provider using MyChart. We now offer e-Visits for anyone 89 and older to request care online for non-urgent symptoms. For details visit [email or web address]. ?  ?Also download the MyChart app! Go to the app store, search "MyChart", open the app, select [facility], and log in with your MyChart username and password. ? ?Due to Covid, a mask is required upon entering the hospital/clinic. If you do not have a mask, one will be given to you upon arrival. For doctor visits, patients may have 1 support person aged 32 or older with them. For treatment visits, patients cannot have anyone with them due to current Covid guidelines and our immunocompromised population.  ? ? ?

## 2021-04-02 ENCOUNTER — Encounter: Payer: Self-pay | Admitting: Oncology

## 2021-04-15 ENCOUNTER — Other Ambulatory Visit: Payer: Self-pay

## 2021-04-15 ENCOUNTER — Inpatient Hospital Stay (INDEPENDENT_AMBULATORY_CARE_PROVIDER_SITE_OTHER): Payer: Medicare Other | Admitting: Hematology and Oncology

## 2021-04-15 ENCOUNTER — Encounter: Payer: Self-pay | Admitting: Hematology and Oncology

## 2021-04-15 ENCOUNTER — Inpatient Hospital Stay: Payer: Medicare Other

## 2021-04-15 DIAGNOSIS — C50411 Malignant neoplasm of upper-outer quadrant of right female breast: Secondary | ICD-10-CM

## 2021-04-15 DIAGNOSIS — M810 Age-related osteoporosis without current pathological fracture: Secondary | ICD-10-CM

## 2021-04-15 DIAGNOSIS — Z170 Estrogen receptor positive status [ER+]: Secondary | ICD-10-CM | POA: Diagnosis not present

## 2021-04-15 LAB — CBC AND DIFFERENTIAL
HCT: 35 — AB (ref 36–46)
Hemoglobin: 11.4 — AB (ref 12.0–16.0)
Neutrophils Absolute: 2.81
Platelets: 183 10*3/uL (ref 150–400)
WBC: 4.6

## 2021-04-15 LAB — BASIC METABOLIC PANEL
BUN: 18 (ref 4–21)
CO2: 28 — AB (ref 13–22)
Chloride: 105 (ref 99–108)
Creatinine: 0.8 (ref 0.5–1.1)
Glucose: 116
Potassium: 4 mEq/L (ref 3.5–5.1)
Sodium: 138 (ref 137–147)

## 2021-04-15 LAB — COMPREHENSIVE METABOLIC PANEL
Albumin: 3.9 (ref 3.5–5.0)
Calcium: 10.3 (ref 8.7–10.7)

## 2021-04-15 LAB — CBC: RBC: 3.72 — AB (ref 3.87–5.11)

## 2021-04-15 LAB — HEPATIC FUNCTION PANEL
ALT: 20 U/L (ref 7–35)
AST: 30 (ref 13–35)
Alkaline Phosphatase: 65 (ref 25–125)
Bilirubin, Total: 1.1

## 2021-04-15 NOTE — Assessment & Plan Note (Signed)
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 I advised her to avoid Tylenol. She does not drink alcohol. ?

## 2021-04-15 NOTE — Assessment & Plan Note (Signed)
Osteoporosis, she is taking vitamin-D, but not calcium. I had started her on calcium and I recommended placing her on Prolia, but she has poor dentition. She then developed mild hypercalcemia, so calcium was discontinued. ?She has decided against treatment for the osteoporosis. ?? ?

## 2021-04-15 NOTE — Assessment & Plan Note (Signed)
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 ?She had an excellent response to this, with just a tiny residual invasive carcinoma. She received 4 cycles of adjuvant Kadcyla and this was discontinued due to toxicities. ??She has started single agent trastuzumab and is tolerating it?well. ?Since she has already had pre- and post-op treatments, she will take 11 more doses to complete 1 year. ?The patient and her daughter state that after much discussion, she does not wish to take adjuvant hormonal therapy. She will proceed with cycle 11 tomorrow and we will plan for follow up in 3 months.  ?

## 2021-04-15 NOTE — Progress Notes (Signed)
?Patient Care Team: ?Guadalupe Maple, MD as PCP - General (General Practice) ?Derwood Kaplan, MD as Consulting Physician (Oncology) ? ?Clinic Day:  04/15/2021 ? ?Referring physician: Guadalupe Maple, MD ? ?ASSESSMENT & PLAN:  ? ?Assessment & Plan: ?Malignant neoplasm of upper-outer quadrant of right breast in female, estrogen receptor positive ([place]) ? 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  She had an excellent response to this, with just a tiny residual invasive carcinoma. She received 4 cycles of adjuvant Kadcyla and this was discontinued due to toxicities.   She has started single agent trastuzumab and is tolerating it well.  Since she has already had pre- and post-op treatments, she will take 11 more doses to complete 1 year.  The patient and her daughter state that after much discussion, she does not wish to take adjuvant hormonal therapy. She will proceed with cycle 11 tomorrow and we will plan for follow up in 3 months.  ? ?Osteoporosis ?Osteoporosis, she is taking vitamin-D, but not calcium. I had started her on calcium and I recommended placing her on Prolia, but she has poor dentition. She then developed mild hypercalcemia, so calcium was discontinued.  She has decided against treatment for the osteoporosis. ?  ? 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 I advised her to avoid Tylenol. She does not drink alcohol. ?  ? ?The patient understands the plans discussed today and is in agreement with them.  She knows to contact our office if she develops concerns prior to her next appointment. ? ? ? ?Melodye Ped, NP  ?[facility] ?[place] ?[place] [address] ?Dept: [phone] ?Dept Fax: [phone]  ? ?No orders of the defined types were placed in this  encounter. ?  ? ? ?CHIEF COMPLAINT:  ?CC: An 88 year old female with history of breast cancer here for 3 week evaluation ? ?Current Treatment:  Trastuzumab ? ?INTERVAL HISTORY:  ?Brandy Jacobs is here today for repeat clinical assessment. She denies fevers or chills. She denies pain. Her appetite is good. Her weight has been stable. ? ?I have reviewed the past medical history, past surgical history, social history and family history with the patient and they are unchanged from previous note. ? ?ALLERGIES:  has No Known Allergies. ? ?MEDICATIONS:  ?Current Outpatient Medications  ?Medication Sig Dispense Refill  ? acetaminophen (TYLENOL) 500 MG tablet Take 500 mg by mouth at bedtime.    ? aspirin EC 81 MG tablet Take 81 mg by mouth daily. Swallow whole.    ? atorvastatin (LIPITOR) 40 MG tablet Take 40 mg by mouth daily.    ? dexamethasone (DECADRON) 4 MG tablet Take 1 tablet (4 mg total) by mouth as directed. 3 pills night before and 3 pills morning of first chemo, then 1 pill night before and morning of weekly chemo (Patient not taking: Reported on 03/04/2021) 30 tablet 2  ? diphenhydrAMINE (BENADRYL) 25 mg capsule Take 25 mg by mouth at bedtime as needed. At night as needed for itching    ? fluconazole (DIFLUCAN) 100 MG tablet Take 1 tablet (100 mg total) by mouth daily. (Patient not taking: Reported on 03/04/2021) 10 tablet 1  ? losartan (COZAAR) 50 MG tablet Take 50 mg by mouth daily.    ? triamcinolone cream (KENALOG) 0.1 % SMARTSIG:1 Application Topical 2-3 Times Daily    ? Vitamin  D3 (VITAMIN D) 25 MCG tablet Take 1,000 Units by mouth daily.    ? ?No current facility-administered medications for this visit.  ? ?Facility-Administered Medications Ordered in Other Visits  ?Medication Dose Route Frequency Provider Last Rate Last Admin  ? acetaminophen (TYLENOL) tablet 650 mg  650 mg Oral Once Derwood Kaplan, MD      ? diphenhydrAMINE (BENADRYL) capsule 50 mg  50 mg Oral Once Derwood Kaplan, MD      ? sodium  chloride flush (NS) 0.9 % injection 10 mL  10 mL Intracatheter PRN Derwood Kaplan, MD   10 mL at 11/20/20 1410  ? ? ?HISTORY OF PRESENT ILLNESS:  ? ?Oncology History  ?Malignant neoplasm of upper-outer quadrant of right breast in female, estrogen receptor positive ([facility])  ?12/18/2019 Cancer Staging  ? Staging form: Breast, AJCC 8th Edition ?- Clinical stage from 12/18/2019: Stage IB (cT2, cN0, cM0, G3, ER+, PR+, HER2+) - Signed by Derwood Kaplan, MD on 01/19/2020 ? ?  ?01/08/2020 Initial Diagnosis  ? Breast cancer, right [facility]) ?  ?01/28/2020 - 03/26/2020 Chemotherapy  ?  Patient is on Treatment Plan: BREAST TRASTUZUMAB + PERTUZUMAB Q21D X 11 CYCLES ? ?  ? ?  ?06/01/2020 - 06/01/2020 Chemotherapy  ?  ? ?  ? ?  ?06/17/2020 - 07/29/2020 Chemotherapy  ?  ? ?  ? ?  ?09/17/2020 -  Chemotherapy  ? Patient is on Treatment Plan : BREAST Trastuzumab q21d X 11 Cycles  ?   ?  ? ? ?REVIEW OF SYSTEMS:  ? ?Constitutional: Denies fevers, chills or abnormal weight loss ?Eyes: Denies blurriness of vision ?Ears, nose, mouth, throat, and face: Denies mucositis or sore throat ?Respiratory: Denies cough, dyspnea or wheezes ?Cardiovascular: Denies palpitation, chest discomfort or lower extremity swelling ?Gastrointestinal:  Denies nausea, heartburn or change in bowel habits ?Skin: Denies abnormal skin rashes ?Lymphatics: Denies new lymphadenopathy or easy bruising ?Neurological:Denies numbness, tingling or new weaknesses ?Behavioral/Psych: Mood is stable, no new changes  ?All other systems were reviewed with the patient and are negative. ? ? ?VITALS:  ?Blood pressure (!) 144/63, pulse (!) 53, temperature 97.8 ?F (36.6 ?C), temperature source Oral, resp. rate 18, height 5' 4.5" (1.638 m), weight 134 lb 3.2 oz (60.9 kg), SpO2 98 %.  ?Wt Readings from Last 3 Encounters:  ?04/15/21 134 lb 3.2 oz (60.9 kg)  ?03/26/21 134 lb (60.8 kg)  ?03/25/21 132 lb (59.9 kg)  ?  ?Body mass index is 22.68 kg/m?. ? ?Performance status (ECOG): 1 - Symptomatic  but completely ambulatory ? ?PHYSICAL EXAM:  ? ?GENERAL:alert, no distress and comfortable ?SKIN: skin color, texture, turgor are normal, no rashes or significant lesions ?EYES: normal, Conjunctiva are pink and non-injected, sclera clear ?OROPHARYNX:no exudate, no erythema and lips, buccal mucosa, and tongue normal  ?NECK: supple, thyroid normal size, non-tender, without nodularity ?LYMPH:  no palpable lymphadenopathy in the cervical, axillary or inguinal ?LUNGS: clear to auscultation and percussion with normal breathing effort ?HEART: regular rate & rhythm and no murmurs and no lower extremity edema ?ABDOMEN:abdomen soft, non-tender and normal bowel sounds ?Musculoskeletal:no cyanosis of digits and no clubbing  ?NEURO: alert & oriented x 3 with fluent speech, no focal motor/sensory deficits ? ?LABORATORY DATA:  ?I have reviewed the data as listed ?   ?Component Value Date/Time  ? NA 138 04/15/2021 0000  ? K 4.0 04/15/2021 0000  ? CL 105 04/15/2021 0000  ? CO2 28 (A) 04/15/2021 0000  ? BUN 18 04/15/2021 0000  ?  CREATININE 0.8 04/15/2021 0000  ? CALCIUM 10.3 04/15/2021 0000  ? CALCIUM 11.1 (H) 10/08/2020 1529  ? PROT 6.2 (A) 03/10/2020 0000  ? ALBUMIN 3.9 04/15/2021 0000  ? AST 30 04/15/2021 0000  ? ALT 20 04/15/2021 0000  ? ALKPHOS 65 04/15/2021 0000  ? ? ?No results found for: SPEP, UPEP ? ?Lab Results  ?Component Value Date  ? WBC 4.6 04/15/2021  ? NEUTROABS 2.81 04/15/2021  ? HGB 11.4 (A) 04/15/2021  ? HCT 35 (A) 04/15/2021  ? MCV 94 03/04/2021  ? PLT 183 04/15/2021  ? ? ?  Chemistry   ?   ?Component Value Date/Time  ? NA 138 04/15/2021 0000  ? K 4.0 04/15/2021 0000  ? CL 105 04/15/2021 0000  ? CO2 28 (A) 04/15/2021 0000  ? BUN 18 04/15/2021 0000  ? CREATININE 0.8 04/15/2021 0000  ? GLU 116 04/15/2021 0000  ?    ?Component Value Date/Time  ? CALCIUM 10.3 04/15/2021 0000  ? CALCIUM 11.1 (H) 10/08/2020 1529  ? ALKPHOS 65 04/15/2021 0000  ? AST 30 04/15/2021 0000  ? ALT 20 04/15/2021 0000  ?  ? ? ? ?RADIOGRAPHIC  STUDIES: ?I have personally reviewed the radiological images as listed and agreed with the findings in the report. ?No results found. ?

## 2021-04-16 ENCOUNTER — Inpatient Hospital Stay: Payer: Medicare Other

## 2021-04-16 VITALS — BP 147/62 | HR 56 | Temp 98.0°F | Resp 18 | Ht 64.5 in | Wt 133.5 lb

## 2021-04-16 DIAGNOSIS — Z170 Estrogen receptor positive status [ER+]: Secondary | ICD-10-CM

## 2021-04-16 DIAGNOSIS — Z5112 Encounter for antineoplastic immunotherapy: Secondary | ICD-10-CM | POA: Diagnosis not present

## 2021-04-16 MED ORDER — SODIUM CHLORIDE 0.9 % IV SOLN: Freq: Once | INTRAVENOUS | Status: AC

## 2021-04-16 MED ORDER — DIPHENHYDRAMINE HCL 25 MG PO CAPS: 50.0000 mg | ORAL_CAPSULE | Freq: Once | ORAL | Status: DC

## 2021-04-16 MED ORDER — TRASTUZUMAB-ANNS CHEMO 150 MG IV SOLR
6.0000 mg/kg | Freq: Once | INTRAVENOUS | Status: AC
  Administered 2021-04-16: 357 mg via INTRAVENOUS
  Filled 2021-04-16: qty 17

## 2021-04-16 MED ORDER — SODIUM CHLORIDE 0.9% FLUSH
10.0000 mL | INTRAVENOUS | Status: DC | PRN
  Administered 2021-04-16: 10 mL

## 2021-04-16 MED ORDER — HEPARIN SOD (PORK) LOCK FLUSH 100 UNIT/ML IV SOLN
500.0000 [IU] | Freq: Once | INTRAVENOUS | Status: AC | PRN
  Administered 2021-04-16: 500 [IU]

## 2021-04-16 MED ORDER — ACETAMINOPHEN 325 MG PO TABS: 650.0000 mg | ORAL_TABLET | Freq: Once | ORAL | Status: DC

## 2021-04-16 NOTE — Patient Instructions (Signed)
[facility]  Discharge Instructions: ?Thank you for choosing [facility] to provide your oncology and hematology care.  ?If you have a lab appointment with the [facility], please go directly to the [facility] and check in at the registration area. ?  ?Wear comfortable clothing and clothing appropriate for easy access to any Portacath or PICC line.  ? ?We strive to give you quality time with your provider. You may need to reschedule your appointment if you arrive late (15 or more minutes).  Arriving late affects you and other patients whose appointments are after yours.  Also, if you miss three or more appointments without notifying the office, you may be dismissed from the clinic at the provider?s discretion.    ?  ?For prescription refill requests, have your pharmacy contact our office and allow 72 hours for refills to be completed.   ? ?Today you received the following chemotherapy and/or immunotherapy agents Tastuzumab    ?  ?To help prevent nausea and vomiting after your treatment, we encourage you to take your nausea medication as directed. ? ?BELOW ARE SYMPTOMS THAT SHOULD BE REPORTED IMMEDIATELY: ?*FEVER GREATER THAN 100.4 F (38 ?C) OR HIGHER ?*CHILLS OR SWEATING ?*NAUSEA AND VOMITING THAT IS NOT CONTROLLED WITH YOUR NAUSEA MEDICATION ?*UNUSUAL SHORTNESS OF BREATH ?*UNUSUAL BRUISING OR BLEEDING ?*URINARY PROBLEMS (pain or burning when urinating, or frequent urination) ?*BOWEL PROBLEMS (unusual diarrhea, constipation, pain near the anus) ?TENDERNESS IN MOUTH AND THROAT WITH OR WITHOUT PRESENCE OF ULCERS (sore throat, sores in mouth, or a toothache) ?UNUSUAL RASH, SWELLING OR PAIN  ?UNUSUAL VAGINAL DISCHARGE OR ITCHING  ? ?Items with * indicate a potential emergency and should be followed up as soon as possible or go to the Emergency Department if any problems should occur. ? ?Please show the CHEMOTHERAPY ALERT CARD or IMMUNOTHERAPY ALERT CARD at check-in to the  Emergency Department and triage nurse. ? ?Should you have questions after your visit or need to cancel or reschedule your appointment, please contact [facility]  Dept: [phone]  and follow the prompts.  Office hours are 8:00 a.m. to 4:30 p.m. Monday - Friday. Please note that voicemails left after 4:00 p.m. may not be returned until the following business day.  We are closed weekends and major holidays. You have access to a nurse at all times for urgent questions. Please call the main number to the clinic Dept: [phone] and follow the prompts. ? ?For any non-urgent questions, you may also contact your provider using MyChart. We now offer e-Visits for anyone 84 and older to request care online for non-urgent symptoms. For details visit [email or web address]. ?  ?Also download the MyChart app! Go to the app store, search "MyChart", open the app, select [facility], and log in with your MyChart username and password. ? ?Due to Covid, a mask is required upon entering the hospital/clinic. If you do not have a mask, one will be given to you upon arrival. For doctor visits, patients may have 1 support person aged 67 or older with them. For treatment visits, patients cannot have anyone with them due to current Covid guidelines and our immunocompromised population.  ? ? ?

## 2021-07-05 ENCOUNTER — Other Ambulatory Visit: Payer: Self-pay | Admitting: Oncology

## 2021-07-15 ENCOUNTER — Other Ambulatory Visit: Payer: Self-pay | Admitting: Oncology

## 2021-07-15 DIAGNOSIS — Z170 Estrogen receptor positive status [ER+]: Secondary | ICD-10-CM

## 2021-07-15 NOTE — Progress Notes (Signed)
[facility] [address] [place],  [place]  [address] [phone]  Clinic Day: 07/16/21  Referring physician: Guadalupe Maple, MD  ASSESSMENT & PLAN:   Assessment & Plan: 1. 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  She had an excellent response to this, with just a tiny residual invasive carcinoma. She received 4 cycles of adjuvant Kadcyla and this was discontinued due to toxicities.   She has started single agent trastuzumab and completed 1 year.  The patient and her daughter decided after much discussion, she does not wish to take adjuvant hormonal therapy.   2. Osteoporosis, she is taking vitamin-D, but not calcium. I had started her on calcium and I recommended placing her on Prolia, but she has poor dentition. She then developed mild hypercalcemia, so calcium was discontinued.  She has decided against treatment for the osteoporosis.   3. 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 I advised her to avoid Tylenol. She does not drink alcohol.   4. Hypercalcemia, mild and long standing.  PTH was normal. We will continue to monitor this.     She completed 1 year of trastuzumab. The patient has opted against endocrine therapy due to the potential side effects, and has declined treatment for her osteoporosis. We flushed her port today.  We will see her back in 3 months with CBC and CMP for routine follow up.  The patient and her daughter understand the plans discussed today and are in agreement with them.  They know to contact our office if she develops issues prior to her next appointment.  I provided 15 minutes of face-to-face time during this this encounter and > 50% was spent counseling as documented under my assessment and plan.    Derwood Kaplan, MD  [facility] [address] [place] [address] Dept: [phone] Dept Fax: [phone]   Orders Placed This Encounter  Procedures   CBC and differential    This external order was created through the Results Console.   CBC    This external order was created through the Results Console.   Basic metabolic panel    This external order was created through the Results Console.   Comprehensive metabolic panel    This external order was created through the Results Console.   Hepatic function panel    This external order was created through the Results Console.      CHIEF COMPLAINT:  CC: Stage IB hormone and HER2/neu receptor positive breast cancer  Current Treatment: Adjuvant trastuzumab every 3 weeks  HISTORY OF PRESENT ILLNESS:  ENVI EAGLESON is a 88 y.o. 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 She had a palpable upper right breast mass, so underwent diagnostic imaging. There was a 2.9 cm upper right breast mass highly suspicious for malignancy.  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 Estrogen and progesterone receptors were positive and HER2 positive.  Ki67 was 40%.  Due to the HER2 positivity, we recommended neoadjuvant therapy with trastuzumab, pertuzumab and weekly paclitaxel weekly for 3 cycles. Echocardiogram in December revealed overall normal left ventricular size and function with an ejection fraction of 60 to 65%. She received her first cycle of neoadjuvant trastuzumab/pertuzumab /paclitaxel  in January. She underwent an additional stereotactic biopsy of an area in the inferior right breast later in January to guide surgery.  Pathology revealed atypical ductal hyperplasia. She completed neoadjuvant therapy in March.   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 She was then referred back to Dr. Lilia Pro and underwent lumpectomy on April 11th, 2022.  Pathology revealed a residual 2 mm, grade 2, invasive ductal carcinoma with foci of ductal carcinoma in situ and atypical lobular hyperplasia.  Estrogen and progesterone receptors were positive and HER2 positive.  Ki-67 was less than 5%.  As she did have a small residual cancer, adjuvant HER2 targeted therapy with Kadcyla (ado-trastuzumab emtansine) was recommended, for a total of a year of HER2 targeted therapy. 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  She has had difficulty with memory, as well as expression since her stroke, which her daughter f eels may have increased since chemotherapy and surgery.  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 Bone density scan revealed osteoporosis with a T-score of -3.8 in the femur, previously -2 and a T-score of -3.6 in the forearm, for which she is on vitamin D due to chronically elevated calcium.  She was placed on Kadcyla adjuvant chemotherapy at the end of May and initially tolerated it fairly well.  She had worsening neuropathy with tingling and pain of the bilateral feet and legs.  It was decided to stop the Kadcyla due to increasing toxicities.  The patient declines hormonal therapy or treatment of her osteoporosis due to toxicities and her advanced age and comorbidities. She was placed on single agent trastuzumab every 3 weeks and completed a full year.  Echocardiogram from August 23rd revealed a normal EF between 55 - 60 %. Repeat ECHO from November was stable. Annual mammogram in December 2022 did not reveal any evidence of malignancy.    INTERVAL HISTORY:  Abaigeal is here for routine follow up.  She states that she is doing well and denies complaints. Bilateral diagnostic mammogram in  December was clear. Blood counts and chemistries are unremarkable except for a calcium of 10.5, stable and a bilirubin of 1.4. Her  appetite is good, and she has lost 1 pound since her last visit.  She denies fever, chills or other signs of infection.  She denies nausea, vomiting, bowel issues, or abdominal pain.  She denies sore throat, cough, dyspnea, or chest pain. Her port was flushed today.  REVIEW OF SYSTEMS:  Review of Systems  Constitutional: Negative.  Negative for appetite change, chills, fatigue, fever and unexpected weight change.  HENT:  Negative.    Eyes: Negative.   Respiratory: Negative.  Negative for chest tightness, cough, hemoptysis, shortness of breath and wheezing.   Cardiovascular: Negative.  Negative for chest pain, leg swelling and palpitations.  Gastrointestinal: Negative.  Negative for abdominal distention, abdominal pain, blood in stool, constipation, diarrhea, nausea and vomiting.  Endocrine: Negative.   Genitourinary: Negative.  Negative for difficulty urinating, dysuria, frequency and hematuria.   Musculoskeletal: Negative.  Negative for arthralgias, back pain, flank pain, gait problem and myalgias.  Skin: Negative.   Neurological: Negative.  Negative for dizziness, extremity weakness, gait problem, headaches, light-headedness, numbness, seizures and speech difficulty.  Hematological: Negative.   Psychiatric/Behavioral: Negative.  Negative for depression and sleep disturbance. The patient is  not nervous/anxious.      VITALS:  Blood pressure (!) 133/59, pulse (!) 20, temperature 97.6 F (36.4 C), temperature source Oral, resp. rate 20, height 5' 4.5" (1.638 m), weight 132 lb 6.4 oz (60.1 kg), SpO2 97 %.  Wt Readings from Last 3 Encounters:  07/16/21 132 lb 6.4 oz (60.1 kg)  04/16/21 133 lb 8 oz (60.6 kg)  04/15/21 134 lb 3.2 oz (60.9 kg)    Body mass index is 22.38 kg/m.  Performance status (ECOG): 0 - Asymptomatic  PHYSICAL EXAM:  Physical  Exam Constitutional:      General: She is not in acute distress.    Appearance: Normal appearance. She is normal weight.  HENT:     Head: Normocephalic and atraumatic.  Eyes:     General: No scleral icterus.    Extraocular Movements: Extraocular movements intact.     Conjunctiva/sclera: Conjunctivae normal.     Pupils: Pupils are equal, round, and reactive to light.  Cardiovascular:     Rate and Rhythm: Normal rate and regular rhythm.     Pulses: Normal pulses.     Heart sounds: Normal heart sounds. No murmur heard.    No friction rub. No gallop.  Pulmonary:     Effort: Pulmonary effort is normal. No respiratory distress.     Breath sounds: Normal breath sounds.  Chest:  Breasts:    Right: Normal.     Left: Normal.     Comments: No masses Abdominal:     General: Bowel sounds are normal. There is no distension.     Palpations: Abdomen is soft. There is no hepatomegaly, splenomegaly or mass.     Tenderness: There is no abdominal tenderness.  Musculoskeletal:        General: Normal range of motion.     Cervical back: Normal range of motion and neck supple.     Right lower leg: No edema.     Left lower leg: No edema.  Lymphadenopathy:     Cervical: No cervical adenopathy.  Skin:    General: Skin is warm and dry.  Neurological:     General: No focal deficit present.     Mental Status: She is alert and oriented to person, place, and time. Mental status is at baseline.  Psychiatric:        Mood and Affect: Mood normal.        Behavior: Behavior normal.        Thought Content: Thought content normal.        Judgment: Judgment normal.    LABS:      Latest Ref Rng & Units 07/16/2021   12:00 AM 04/15/2021   12:00 AM 03/25/2021   12:00 AM  CBC  WBC  4.8     4.6     5.0   Hemoglobin 12.0 - 16.0 12.6     11.4     12.7   Hematocrit 36 - 46 38     35     37   Platelets 150 - 400 K/uL 170     183     171      This result is from an external source.      Latest Ref Rng &  Units 07/16/2021   12:00 AM 04/15/2021   12:00 AM 03/25/2021   12:00 AM  CMP  BUN 4 - _0 Creatinine 0.5 - 1.1 0.8  0.8     0.8   Sodium 137 - 147 137     138     141   Potassium 3.5 - 5.1 mEq/L 4.8     4.0     4.4   Chloride 99 - 108 102     105     106   CO2 13 - _0 Calcium 8.7 - 10.7 10.5     10.3     10.6   Alkaline Phos 25 - 125 69     65     65   AST 13 - 35 30     30     35   ALT 7 - 35 U/L _1 This result is from an external source.    STUDIES:  Annual diagnostic bilateral mammogram fro 12/28/20 is clear.   HISTORY:   Allergies: No Known Allergies  Current Medications: Current Outpatient Medications  Medication Sig Dispense Refill   acetaminophen (TYLENOL) 500 MG tablet Take 500 mg by mouth at bedtime.     aspirin EC 81 MG tablet Take 81 mg by mouth daily. Swallow whole.     atorvastatin (LIPITOR) 40 MG tablet Take 40 mg by mouth daily.     losartan (COZAAR) 50 MG tablet Take 50 mg by mouth daily.     triamcinolone cream (KENALOG) 0.1 % SMARTSIG:1 Application Topical 2-3 Times Daily     Vitamin D3 (VITAMIN D) 25 MCG tablet Take 1,000 Units by mouth daily.     Current Facility-Administered Medications  Medication Dose Route Frequency Provider Last Rate Last Admin   sodium chloride flush (NS) 0.9 % injection 10 mL  10 mL Intracatheter PRN Derwood Kaplan, MD   10 mL at 07/16/21 1420

## 2021-07-16 ENCOUNTER — Inpatient Hospital Stay: Payer: Medicare Other

## 2021-07-16 ENCOUNTER — Other Ambulatory Visit: Payer: Self-pay | Admitting: Oncology

## 2021-07-16 ENCOUNTER — Inpatient Hospital Stay: Payer: Medicare Other | Attending: Oncology | Admitting: Oncology

## 2021-07-16 ENCOUNTER — Encounter: Payer: Self-pay | Admitting: Oncology

## 2021-07-16 VITALS — BP 133/59 | HR 20 | Temp 97.6°F | Resp 20 | Ht 64.5 in | Wt 132.4 lb

## 2021-07-16 DIAGNOSIS — C50411 Malignant neoplasm of upper-outer quadrant of right female breast: Secondary | ICD-10-CM | POA: Diagnosis not present

## 2021-07-16 DIAGNOSIS — Z452 Encounter for adjustment and management of vascular access device: Secondary | ICD-10-CM | POA: Diagnosis present

## 2021-07-16 DIAGNOSIS — Z170 Estrogen receptor positive status [ER+]: Secondary | ICD-10-CM

## 2021-07-16 DIAGNOSIS — M810 Age-related osteoporosis without current pathological fracture: Secondary | ICD-10-CM

## 2021-07-16 LAB — BASIC METABOLIC PANEL
BUN: 16 (ref 4–21)
CO2: 29 — AB (ref 13–22)
Chloride: 102 (ref 99–108)
Creatinine: 0.8 (ref 0.5–1.1)
Glucose: 105
Potassium: 4.8 mEq/L (ref 3.5–5.1)
Sodium: 137 (ref 137–147)

## 2021-07-16 LAB — CBC AND DIFFERENTIAL
HCT: 38 (ref 36–46)
Hemoglobin: 12.6 (ref 12.0–16.0)
Neutrophils Absolute: 2.64
Platelets: 170 10*3/uL (ref 150–400)
WBC: 4.8

## 2021-07-16 LAB — CBC: RBC: 4.06 (ref 3.87–5.11)

## 2021-07-16 LAB — HEPATIC FUNCTION PANEL
ALT: 18 U/L (ref 7–35)
AST: 30 (ref 13–35)
Alkaline Phosphatase: 69 (ref 25–125)
Bilirubin, Total: 1.4

## 2021-07-16 LAB — COMPREHENSIVE METABOLIC PANEL
Albumin: 4.4 (ref 3.5–5.0)
Calcium: 10.5 (ref 8.7–10.7)

## 2021-07-16 MED ORDER — HEPARIN SOD (PORK) LOCK FLUSH 100 UNIT/ML IV SOLN
500.0000 [IU] | Freq: Once | INTRAVENOUS | Status: AC | PRN
  Administered 2021-07-16: 500 [IU]

## 2021-07-16 MED ORDER — SODIUM CHLORIDE 0.9% FLUSH
10.0000 mL | INTRAVENOUS | Status: DC | PRN
  Administered 2021-07-16: 10 mL

## 2021-08-14 ENCOUNTER — Encounter: Payer: Self-pay | Admitting: Oncology

## 2021-10-15 ENCOUNTER — Other Ambulatory Visit: Payer: Self-pay | Admitting: Oncology

## 2021-10-15 ENCOUNTER — Inpatient Hospital Stay: Payer: Medicare Other

## 2021-10-15 ENCOUNTER — Inpatient Hospital Stay: Payer: Medicare Other | Attending: Oncology | Admitting: Oncology

## 2021-10-15 ENCOUNTER — Encounter: Payer: Self-pay | Admitting: Oncology

## 2021-10-15 VITALS — BP 141/65 | HR 59 | Temp 97.5°F | Resp 16 | Ht 64.5 in | Wt 136.1 lb

## 2021-10-15 DIAGNOSIS — M810 Age-related osteoporosis without current pathological fracture: Secondary | ICD-10-CM | POA: Diagnosis not present

## 2021-10-15 DIAGNOSIS — Z170 Estrogen receptor positive status [ER+]: Secondary | ICD-10-CM

## 2021-10-15 DIAGNOSIS — C50411 Malignant neoplasm of upper-outer quadrant of right female breast: Secondary | ICD-10-CM

## 2021-10-15 LAB — CBC AND DIFFERENTIAL
HCT: 37 (ref 36–46)
Hemoglobin: 12.2 (ref 12.0–16.0)
Neutrophils Absolute: 2.96
Platelets: 182 10*3/uL (ref 150–400)
WBC: 5.2

## 2021-10-15 LAB — COMPREHENSIVE METABOLIC PANEL
Albumin: 4.2 (ref 3.5–5.0)
Calcium: 10.7 (ref 8.7–10.7)

## 2021-10-15 LAB — HEPATIC FUNCTION PANEL
ALT: 18 U/L (ref 7–35)
AST: 30 (ref 13–35)
Alkaline Phosphatase: 75 (ref 25–125)
Bilirubin, Total: 1.4

## 2021-10-15 LAB — CBC: RBC: 3.93 (ref 3.87–5.11)

## 2021-10-15 LAB — BASIC METABOLIC PANEL
BUN: 21 (ref 4–21)
CO2: 30 — AB (ref 13–22)
Chloride: 102 (ref 99–108)
Creatinine: 1 (ref 0.5–1.1)
Glucose: 111
Potassium: 4.8 mEq/L (ref 3.5–5.1)
Sodium: 135 — AB (ref 137–147)

## 2021-10-15 NOTE — Progress Notes (Signed)
[facility] [address] [place],  [place]  [address] [phone]  Clinic Day: October 15, 2021  Referring physician: Guadalupe Maple, MD  ASSESSMENT & PLAN:   Assessment & Plan: 1. 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  She had an excellent response to this, with just a tiny residual invasive carcinoma. She received 4 cycles of adjuvant Kadcyla and this was discontinued due to toxicities.   She has started single agent trastuzumab and completed 1 year. The patient and her daughter decided after much discussion that she does not wish to take adjuvant hormonal therapy.   2. Osteoporosis, she is taking vitamin-D, but not calcium. I had started her on calcium and I recommended placing her on Prolia, but she has poor dentition. She then developed mild hypercalcemia, so calcium was discontinued.  She has decided against treatment for the osteoporosis.   3. 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 I advised her to avoid Tylenol. She does not drink alcohol.   4. Hypercalcemia, mild and long standing.  PTH was normal. We will continue to monitor this.     She completed 1 year of trastuzumab. The patient has opted against endocrine therapy due to the potential side effects, and has declined treatment for her osteoporosis. We flushed her port today.  She will see Dr. Lilia Pro in December after she has her annual mammogram.  Therefore we will see her back in 6 months with CBC and CMP for routine follow up.  The patient and her daughter understand the plans discussed today and are in agreement with them.  They know to contact our office if she develops issues prior to her next appointment.  I provided 15 minutes of face-to-face time during this this encounter and > 50% was spent counseling as documented under my  assessment and plan.    Derwood Kaplan, MD  [facility] AT [facility] [address] [place] [address] Dept: [phone] Dept Fax: [phone]   Orders Placed This Encounter  Procedures   CBC and differential    This external order was created through the Results Console.   CBC    This external order was created through the Results Console.   Basic metabolic panel    This external order was created through the Results Console.   Comprehensive metabolic panel    This external order was created through the Results Console.   Hepatic function panel    This external order was created through the Results Console.      CHIEF COMPLAINT:  CC: Stage IB hormone and HER2/neu receptor positive breast cancer  Current Treatment: Adjuvant trastuzumab every 3 weeks  HISTORY OF PRESENT ILLNESS:  Brandy Jacobs is a 88 y.o. 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 She had a palpable upper right breast mass, so underwent diagnostic imaging. There was a 2.9 cm upper right breast mass highly suspicious for malignancy.  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 Estrogen and progesterone receptors were positive and HER2 positive.  Ki67 was 40%.  Due to the HER2 positivity, we recommended neoadjuvant therapy with trastuzumab, pertuzumab and weekly paclitaxel weekly for 3 cycles. Echocardiogram in December revealed overall normal left ventricular size and function  with an ejection fraction of 60 to 65%. She received her first cycle of neoadjuvant trastuzumab/pertuzumab /paclitaxel in January. She underwent an additional stereotactic biopsy of an area in the inferior right breast later in January to guide surgery.  Pathology revealed atypical ductal hyperplasia. She completed neoadjuvant therapy in  March.   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 She was then referred back to Dr. Lilia Pro and underwent lumpectomy on April 11th, 2022.  Pathology revealed a residual 2 mm, grade 2, invasive ductal carcinoma with foci of ductal carcinoma in situ and atypical lobular hyperplasia.  Estrogen and progesterone receptors were positive and HER2 positive.  Ki-67 was less than 5%.  As she did have a small residual cancer, adjuvant HER2 targeted therapy with Kadcyla (ado-trastuzumab emtansine) was recommended, for a total of a year of HER2 targeted therapy. 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  She has had difficulty with memory, as well as expression since her stroke, which her daughter f eels may have increased since chemotherapy and surgery.  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 Bone density scan revealed osteoporosis with a T-score of -3.8 in the femur, previously -2 and a T-score of -3.6 in the forearm, for which she is on vitamin D due to chronically elevated calcium.  She was placed on Kadcyla adjuvant chemotherapy at the end of May and initially tolerated it fairly well.  She had worsening neuropathy with tingling and pain of the bilateral feet and legs.  It was decided to stop the Kadcyla due to increasing toxicities.  The patient declines hormonal therapy or treatment of her osteoporosis due to toxicities and her advanced age and comorbidities. She was placed on single agent trastuzumab every 3 weeks and completed a full year.  Echocardiogram from August 23rd revealed a normal EF between 55 - 60 %. Repeat ECHO from November was stable. Annual mammogram in December 2022 did not reveal any evidence of malignancy.    INTERVAL HISTORY:  Brandy Jacobs is here for routine follow up.   She states that she is doing well and denies complaints. Bilateral diagnostic mammogram in December was clear. Blood counts and chemistries are unremarkable except for a calcium of 10.7, slightly higher, and a bilirubin of 1.4, stable. Her  appetite is good, and she has gained 4 pounds since her last visit.  She denies fever, chills or other signs of infection.  She denies nausea, vomiting, bowel issues, or abdominal pain.  She denies sore throat, cough, dyspnea, or chest pain. Her port was flushed today.  REVIEW OF SYSTEMS:  Review of Systems  Constitutional: Negative.  Negative for appetite change, chills, fatigue, fever and unexpected weight change.  HENT:  Negative.    Eyes: Negative.   Respiratory: Negative.  Negative for chest tightness, cough, hemoptysis, shortness of breath and wheezing.   Cardiovascular: Negative.  Negative for chest pain, leg swelling and palpitations.  Gastrointestinal: Negative.  Negative for abdominal distention, abdominal pain, blood in stool, constipation, diarrhea, nausea and vomiting.  Endocrine: Negative.   Genitourinary: Negative.  Negative for difficulty urinating, dysuria, frequency and hematuria.   Musculoskeletal: Negative.  Negative for arthralgias, back pain, flank pain, gait problem and myalgias.  Skin: Negative.   Neurological: Negative.  Negative for dizziness, extremity weakness, gait problem, headaches, light-headedness, numbness, seizures and  speech difficulty.  Hematological: Negative.   Psychiatric/Behavioral: Negative.  Negative for depression and sleep disturbance. The patient is not nervous/anxious.      VITALS:  Blood pressure (!) 141/65, pulse (!) 59, temperature (!) 97.5 F (36.4 C), temperature source Oral, resp. rate 16, height 5' 4.5" (1.638 m), weight 136 lb 1.6 oz (61.7 kg), SpO2 97 %.  Wt Readings from Last 3 Encounters:  10/15/21 136 lb 1.6 oz (61.7 kg)  07/16/21 132 lb 6.4 oz (60.1 kg)  04/16/21 133 lb 8 oz (60.6 kg)    Body  mass index is 23 kg/m.  Performance status (ECOG): 0 - Asymptomatic  PHYSICAL EXAM:  Physical Exam Constitutional:      General: She is not in acute distress.    Appearance: Normal appearance. She is normal weight.  HENT:     Head: Normocephalic and atraumatic.  Eyes:     General: No scleral icterus.    Extraocular Movements: Extraocular movements intact.     Conjunctiva/sclera: Conjunctivae normal.     Pupils: Pupils are equal, round, and reactive to light.  Cardiovascular:     Rate and Rhythm: Normal rate and regular rhythm.     Pulses: Normal pulses.     Heart sounds: Normal heart sounds. No murmur heard.    No friction rub. No gallop.  Pulmonary:     Effort: Pulmonary effort is normal. No respiratory distress.     Breath sounds: Normal breath sounds.  Chest:  Breasts:    Right: Normal.     Left: Normal.     Comments: No masses Abdominal:     General: Bowel sounds are normal. There is no distension.     Palpations: Abdomen is soft. There is no hepatomegaly, splenomegaly or mass.     Tenderness: There is no abdominal tenderness.  Musculoskeletal:        General: Normal range of motion.     Cervical back: Normal range of motion and neck supple.     Right lower leg: No edema.     Left lower leg: No edema.  Lymphadenopathy:     Cervical: No cervical adenopathy.  Skin:    General: Skin is warm and dry.  Neurological:     General: No focal deficit present.     Mental Status: She is alert and oriented to person, place, and time. Mental status is at baseline.  Psychiatric:        Mood and Affect: Mood normal.        Behavior: Behavior normal.        Thought Content: Thought content normal.        Judgment: Judgment normal.     LABS:      Latest Ref Rng & Units 10/15/2021   12:00 AM 07/16/2021   12:00 AM 04/15/2021   12:00 AM  CBC  WBC  5.2     4.8     4.6      Hemoglobin 12.0 - 16.0 12.2     12.6     11.4      Hematocrit 36 - 46 37     38     35      Platelets  150 - 400 K/uL 182     170     183         This result is from an external source.      Latest Ref Rng & Units 10/15/2021   12:00 AM 07/16/2021   12:00 AM 04/15/2021  12:00 AM  CMP  BUN 4 - _0 Creatinine 0.5 - 1.1 1.0     0.8     0.8      Sodium 137 - 147 135     137     138      Potassium 3.5 - 5.1 mEq/L 4.8     4.8     4.0      Chloride 99 - 108 102     102     105      CO2 13 - _1 Calcium 8.7 - 10.7 10.7     10.5     10.3      Alkaline Phos 25 - 125 75     69     65      AST 13 - 35 _2 ALT 7 - 35 U/L _3 This result is from an external source.    STUDIES:  Annual diagnostic bilateral mammogram fro 12/28/20 is clear.   HISTORY:   Allergies: No Known Allergies  Current Medications: Current Outpatient Medications  Medication Sig Dispense Refill   acetaminophen (TYLENOL) 500 MG tablet Take 500 mg by mouth at bedtime.     aspirin EC 81 MG tablet Take 81 mg by mouth daily. Swallow whole.     atorvastatin (LIPITOR) 40 MG tablet Take 40 mg by mouth daily.     losartan (COZAAR) 50 MG tablet Take 50 mg by mouth daily.     triamcinolone cream (KENALOG) 0.1 % SMARTSIG:1 Application Topical 2-3 Times Daily     Vitamin D3 (VITAMIN D) 25 MCG tablet Take 1,000 Units by mouth daily.     No current facility-administered medications for this visit.

## 2021-11-02 ENCOUNTER — Encounter: Payer: Self-pay | Admitting: Oncology

## 2022-04-14 ENCOUNTER — Inpatient Hospital Stay: Payer: Medicare Other | Attending: Oncology

## 2022-04-14 ENCOUNTER — Encounter: Payer: Self-pay | Admitting: Oncology

## 2022-04-14 ENCOUNTER — Inpatient Hospital Stay: Payer: Medicare Other

## 2022-04-14 ENCOUNTER — Inpatient Hospital Stay: Payer: Medicare Other | Admitting: Oncology

## 2022-04-14 ENCOUNTER — Other Ambulatory Visit: Payer: Self-pay | Admitting: Oncology

## 2022-04-14 VITALS — BP 129/62 | HR 54 | Temp 98.1°F | Resp 17 | Ht 64.5 in | Wt 134.7 lb

## 2022-04-14 DIAGNOSIS — Z170 Estrogen receptor positive status [ER+]: Secondary | ICD-10-CM

## 2022-04-14 DIAGNOSIS — C50411 Malignant neoplasm of upper-outer quadrant of right female breast: Secondary | ICD-10-CM | POA: Insufficient documentation

## 2022-04-14 DIAGNOSIS — M810 Age-related osteoporosis without current pathological fracture: Secondary | ICD-10-CM

## 2022-04-14 LAB — CBC WITH DIFFERENTIAL (CANCER CENTER ONLY)
Abs Immature Granulocytes: 0.03 10*3/uL (ref 0.00–0.07)
Basophils Absolute: 0.1 10*3/uL (ref 0.0–0.1)
Basophils Relative: 1 %
Eosinophils Absolute: 0.2 10*3/uL (ref 0.0–0.5)
Eosinophils Relative: 4 %
HCT: 38.3 % (ref 36.0–46.0)
Hemoglobin: 12 g/dL (ref 12.0–15.0)
Immature Granulocytes: 1 %
Lymphocytes Relative: 32 %
Lymphs Abs: 1.6 10*3/uL (ref 0.7–4.0)
MCH: 31 pg (ref 26.0–34.0)
MCHC: 31.3 g/dL (ref 30.0–36.0)
MCV: 99 fL (ref 80.0–100.0)
Monocytes Absolute: 0.5 10*3/uL (ref 0.1–1.0)
Monocytes Relative: 10 %
Neutro Abs: 2.7 10*3/uL (ref 1.7–7.7)
Neutrophils Relative %: 52 %
Platelet Count: 179 10*3/uL (ref 150–400)
RBC: 3.87 MIL/uL (ref 3.87–5.11)
RDW: 14.3 % (ref 11.5–15.5)
WBC Count: 5.1 10*3/uL (ref 4.0–10.5)
nRBC: 0 % (ref 0.0–0.2)

## 2022-04-14 LAB — CMP (CANCER CENTER ONLY)
ALT: 14 U/L (ref 0–44)
AST: 22 U/L (ref 15–41)
Albumin: 4.2 g/dL (ref 3.5–5.0)
Alkaline Phosphatase: 61 U/L (ref 38–126)
Anion gap: 6 (ref 5–15)
BUN: 16 mg/dL (ref 8–23)
CO2: 27 mmol/L (ref 22–32)
Calcium: 10.4 mg/dL — ABNORMAL HIGH (ref 8.9–10.3)
Chloride: 103 mmol/L (ref 98–111)
Creatinine: 0.79 mg/dL (ref 0.44–1.00)
GFR, Estimated: >60 mL/min (ref >60)
Glucose, Bld: 110 mg/dL — ABNORMAL HIGH (ref 70–99)
Potassium: 4.2 mmol/L (ref 3.5–5.1)
Sodium: 136 mmol/L (ref 135–145)
Total Bilirubin: 1.5 mg/dL — ABNORMAL HIGH (ref 0.3–1.2)
Total Protein: 7.1 g/dL (ref 6.5–8.1)

## 2022-04-14 NOTE — Progress Notes (Signed)
[facility] [address] [place],  [name]  [address] [phone]  Clinic Day: 04/14/22   Referring physician: [name], MD  ASSESSMENT & PLAN:   Assessment & Plan: 1. 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  She had an excellent response to this, with just a tiny residual invasive carcinoma. She received 4 cycles of adjuvant Kadcyla and this was discontinued due to toxicities.   She was changed to single agent trastuzumab and completed 1 year. The patient and her daughter decided after much discussion that she does not wish to take adjuvant hormonal therapy.   2. Osteoporosis, she is taking vitamin-D, but not calcium. I had started her on calcium and I recommended placing her on Prolia, but she has poor dentition. She then developed mild hypercalcemia, so calcium was discontinued.  She has decided against treatment for the osteoporosis.   3. 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 I advised her to avoid Tylenol. She does not drink alcohol.   4. Hypercalcemia, mild and long standing.  PTH was normal. We will continue to monitor this.  5.  Benign breast biopsy January 2024.     Plan: Labs are pending from today. She will see Dr. Lequita Halt in June after she has her diagnostic mammogram.  Therefore we will see her back in 6 months with CBC and CMP for routine follow up.  The patient and her daughter understand the plans discussed today and are in agreement with them.  They know to contact our office if she develops issues prior to her next appointment.  I provided 15 minutes of face-to-face time during this this encounter and > 50% was spent counseling as documented under my assessment and plan.    [name], MD  [facility] AT [facility] [address] [name] [address] Dept: [phone] Dept Fax: [phone]   No orders of the defined types were placed in this encounter.     CHIEF COMPLAINT:  CC: Stage IB hormone and HER2/neu receptor positive breast cancer  Current Treatment: Surveillance  HISTORY OF PRESENT ILLNESS:  Brandy Jacobs is a 89 y.o. 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 She had a palpable upper right breast mass, so underwent diagnostic imaging. There was a 2.9 cm upper right breast mass highly suspicious for malignancy.  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 Estrogen and progesterone receptors were positive and HER2 positive.  Ki67 was 40%.  Due to the HER2 positivity, we recommended neoadjuvant therapy with trastuzumab, pertuzumab and weekly paclitaxel weekly for 3 cycles. Echocardiogram in December revealed overall normal left ventricular size and function with an ejection fraction of 60 to 65%. She received her first cycle of neoadjuvant trastuzumab/pertuzumab /paclitaxel in January. She underwent an additional stereotactic biopsy of an area in the inferior right breast later in January to guide surgery.  Pathology revealed atypical ductal hyperplasia. She completed neoadjuvant therapy in March.   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 She was then referred back to Dr. Lequita Halt and underwent lumpectomy on April 11th, 2022.  Pathology revealed a residual 2 mm, grade 2, invasive ductal carcinoma with foci of  ductal carcinoma in situ and atypical lobular hyperplasia.  Estrogen and progesterone receptors were positive and HER2 positive.  Ki-67 was less than 5%.  As she did have a small residual cancer, adjuvant HER2 targeted therapy with Kadcyla (ado-trastuzumab emtansine) was  recommended, for a total of a year of HER2 targeted therapy. 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  She has had difficulty with memory, as well as expression since her stroke, which her daughter f eels may have increased since chemotherapy and surgery.  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 Bone density scan revealed osteoporosis with a T-score of -3.8 in the femur, previously -2 and a T-score of -3.6 in the forearm, for which she is on vitamin D due to chronically elevated calcium.  She was placed on Kadcyla adjuvant chemotherapy at the end of May and initially tolerated it fairly well.  She had worsening neuropathy with tingling and pain of the bilateral feet and legs.  It was decided to stop the Kadcyla due to increasing toxicities.  The patient declines hormonal therapy or treatment of her osteoporosis due to toxicities and her advanced age and comorbidities. She was placed on single agent trastuzumab every 3 weeks and completed a full year.  Echocardiogram from August 23rd revealed a normal EF between 55 - 60 %. Repeat ECHO from November was stable. Annual mammogram in December 2022 did not reveal any evidence of malignancy.    INTERVAL HISTORY:  [name] is here for routine follow up.  She states that she is doing fine. Dr.Morgan performed a biopsy in December but found no evidence of recurrence. The pathology revealed atrophic breast tissue with fibrocystic changes and no atypia or malignancy.  Her port was also removed. She will see him again in June following her diagnostic mammogram. Her  appetite is good, and she has lost 2 pounds since her last visit.  She denies fever, chills or other signs of infection.  She denies nausea, vomiting, bowel issues, or abdominal pain.  She denies sore throat, cough, dyspnea, or chest pain.   REVIEW OF  SYSTEMS:  Review of Systems  Constitutional: Negative.  Negative for appetite change, chills, fatigue, fever and unexpected weight change.  HENT:  Negative.    Eyes: Negative.   Respiratory: Negative.  Negative for chest tightness, cough, hemoptysis, shortness of breath and wheezing.   Cardiovascular: Negative.  Negative for chest pain, leg swelling and palpitations.  Gastrointestinal: Negative.  Negative for abdominal distention, abdominal pain, blood in stool, constipation, diarrhea, nausea and vomiting.  Endocrine: Negative.   Genitourinary: Negative.  Negative for difficulty urinating, dysuria, frequency and hematuria.   Musculoskeletal: Negative.  Negative for arthralgias, back pain, flank pain, gait problem and myalgias.  Skin: Negative.   Neurological: Negative.  Negative for dizziness, extremity weakness, gait problem, headaches, light-headedness, numbness, seizures and speech difficulty.  Hematological: Negative.   Psychiatric/Behavioral: Negative.  Negative for depression and sleep disturbance. The patient is not nervous/anxious.      VITALS:  Blood pressure 129/62, pulse (!) 54, temperature 98.1 F (36.7 C), temperature source Oral, resp. rate 17, height 5' 4.5" (1.638 m), weight 134 lb 11.2 oz (61.1 kg), SpO2 99 %.  Wt Readings from Last 3 Encounters:  04/14/22 134 lb 11.2 oz (61.1 kg)  10/15/21 136 lb 1.6 oz (61.7 kg)  07/16/21 132 lb 6.4 oz (60.1 kg)  Body mass index is 22.76 kg/m.  Performance status (ECOG): 0 - Asymptomatic  PHYSICAL EXAM:  Physical Exam Constitutional:      General: She is not in acute distress.    Appearance: Normal appearance. She is normal weight.  HENT:     Head: Normocephalic and atraumatic.  Eyes:     General: No scleral icterus.    Extraocular Movements: Extraocular movements intact.     Conjunctiva/sclera: Conjunctivae normal.     Pupils: Pupils are equal, round, and reactive to light.  Cardiovascular:     Rate and Rhythm: Normal  rate and regular rhythm.     Pulses: Normal pulses.     Heart sounds: Normal heart sounds. No murmur heard.    No friction rub. No gallop.  Pulmonary:     Effort: Pulmonary effort is normal. No respiratory distress.     Breath sounds: Normal breath sounds.  Chest:  Breasts:    Right: Normal.     Left: Normal.     Comments: No masses  Abdominal:     General: Bowel sounds are normal. There is no distension.     Palpations: Abdomen is soft. There is no hepatomegaly, splenomegaly or mass.     Tenderness: There is no abdominal tenderness.  Musculoskeletal:        General: Normal range of motion.     Cervical back: Normal range of motion and neck supple.     Right lower leg: No edema.     Left lower leg: No edema.  Lymphadenopathy:     Cervical: No cervical adenopathy.  Skin:    General: Skin is warm and dry.  Neurological:     General: No focal deficit present.     Mental Status: She is alert and oriented to person, place, and time. Mental status is at baseline.  Psychiatric:        Mood and Affect: Mood normal.        Behavior: Behavior normal.        Thought Content: Thought content normal.        Judgment: Judgment normal.     LABS:      Latest Ref Rng & Units 04/14/2022    2:26 PM 10/15/2021   12:00 AM 07/16/2021   12:00 AM  CBC  WBC 4.0 - 10.5 K/uL 5.1  5.2     4.8      Hemoglobin 12.0 - 15.0 g/dL 74.2  59.5     63.8      Hematocrit 36.0 - 46.0 % 38.3  37     38      Platelets 150 - 400 K/uL 179  182     170         This result is from an external source.      Latest Ref Rng & Units 04/14/2022    2:26 PM 10/15/2021   12:00 AM 07/16/2021   12:00 AM  CMP  Glucose 70 - 99 mg/dL 756     BUN 8 - 23 mg/dL 16  21     16       Creatinine 0.44 - 1.00 mg/dL 4.33  1.0     0.8      Sodium 135 - 145 mmol/L 136  135     137      Potassium 3.5 - 5.1 mmol/L 4.2  4.8     4.8      Chloride 98 - 111 mmol/L 103  102  102      CO2 22 - 32 mmol/L Calcium 8.9  - 10.3 mg/dL 40.9  81.1     91.4      Total Protein 6.5 - 8.1 g/dL 7.1     Total Bilirubin 0.3 - 1.2 mg/dL 1.5     Alkaline Phos 38 - 126 U/L 61  75     69      AST 15 - 41 U/L ALT 0 - 44 U/L This result is from an external source.    STUDIES:  Annual diagnostic bilateral mammogram fro 12/28/20 is clear.   HISTORY:   Allergies: No Known Allergies  Current Medications: Current Outpatient Medications  Medication Sig Dispense Refill   acetaminophen (TYLENOL) 500 MG tablet Take 500 mg by mouth at bedtime.     aspirin EC 81 MG tablet Take 81 mg by mouth daily. Swallow whole.     atorvastatin (LIPITOR) 40 MG tablet Take 40 mg by mouth daily.     losartan (COZAAR) 50 MG tablet Take 50 mg by mouth daily.     triamcinolone cream (KENALOG) 0.1 % SMARTSIG:1 Application Topical 2-3 Times Daily     Vitamin D3 (VITAMIN D) 25 MCG tablet Take 1,000 Units by mouth daily.     No current facility-administered medications for this visit.      I,Gabriella Ballesteros,acting as a scribe for [name], MD.,have documented all relevant documentation on the behalf of [name], MD,as directed by  [name], MD while in the presence of [name], MD.

## 2022-04-15 ENCOUNTER — Ambulatory Visit: Payer: Medicare Other | Admitting: Oncology

## 2022-04-15 ENCOUNTER — Other Ambulatory Visit: Payer: Medicare Other

## 2022-05-08 ENCOUNTER — Encounter: Payer: Self-pay | Admitting: Oncology

## 2022-05-14 IMAGING — MG MM BREAST BX W/ LOC DEV 1ST LESION IMAGE BX SPEC STEREO GUIDE*R*
8 of 10 series · 8 of 18 positions shown · non-contrast
Comparison: Previous exams.
COMPARISON: Previous exams.

Addendum:
CLINICAL DATA: Biopsy of right breast calcifications

EXAM:
RIGHT BREAST STEREOTACTIC CORE NEEDLE BIOPSY

[R (1 of 7)]
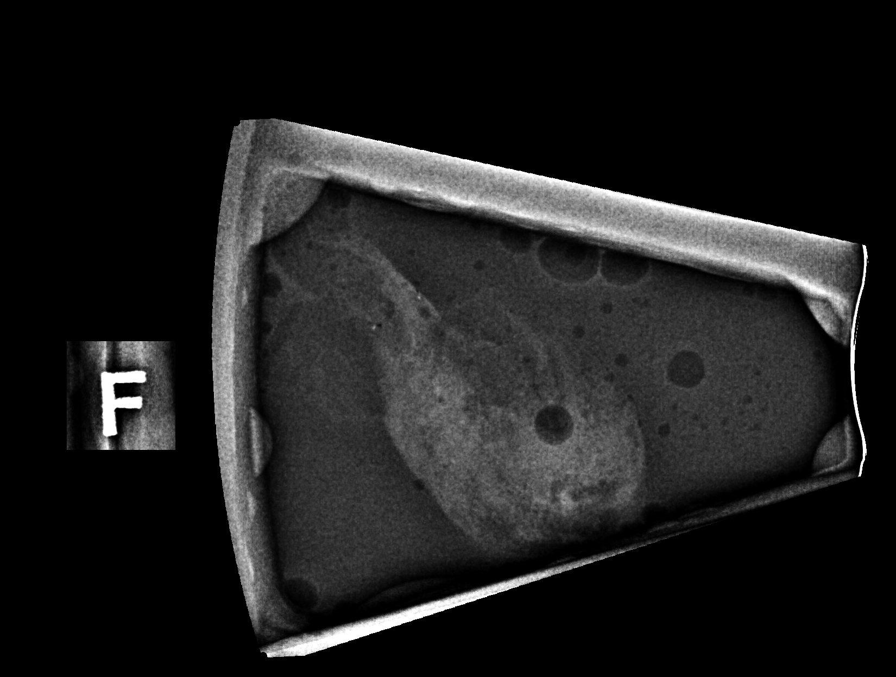

[R (2 of 7)]
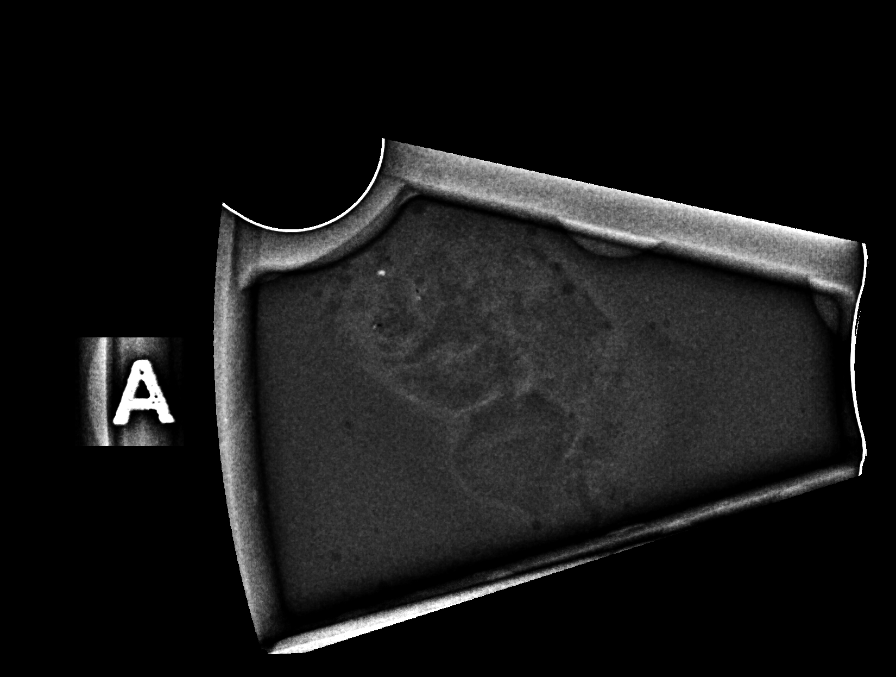

[R (3 of 7)]
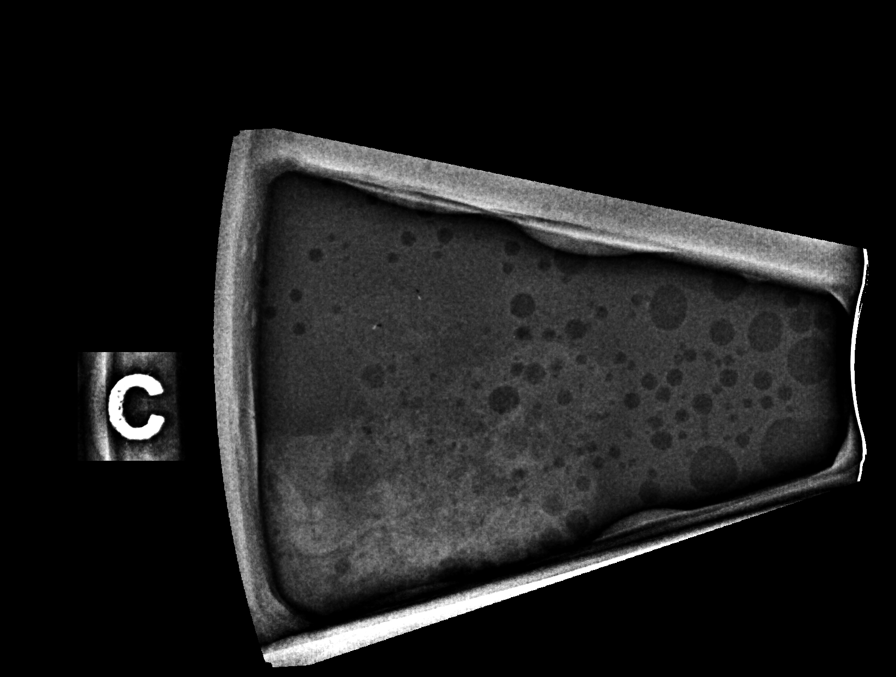

[R (4 of 7)]
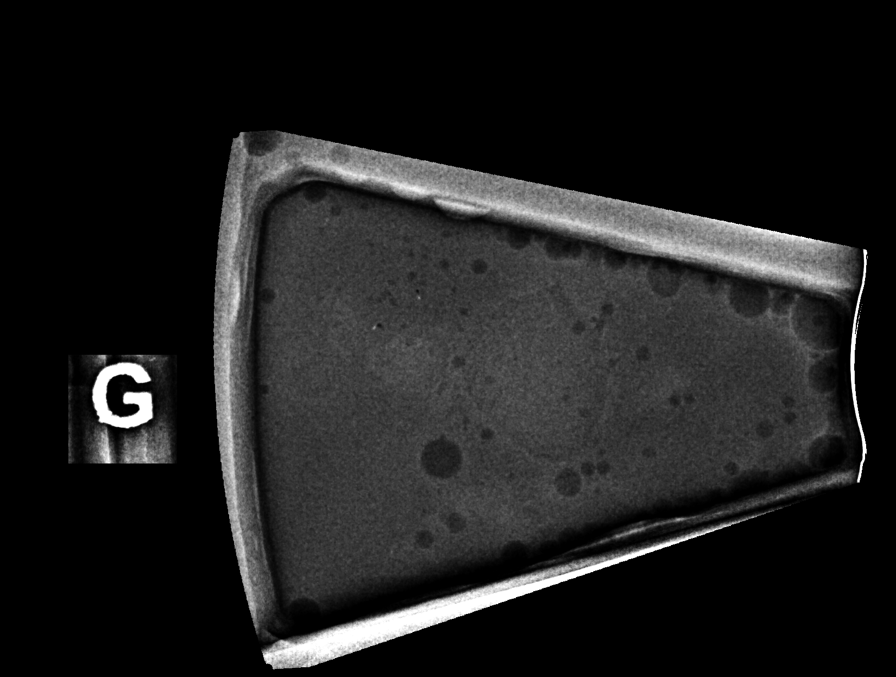

[R (5 of 7)]
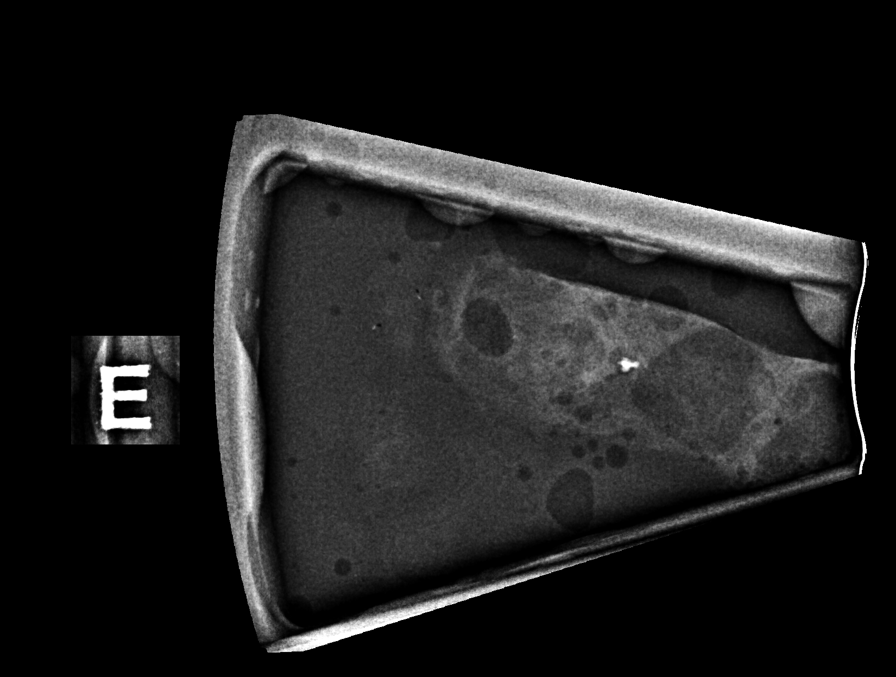

[R (6 of 7)]
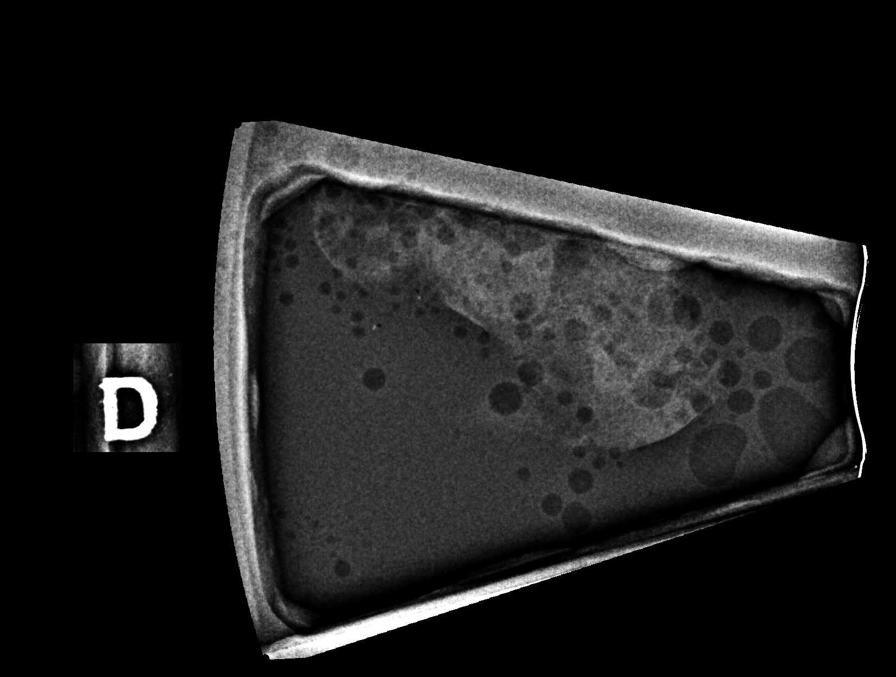

[R (7 of 7)]
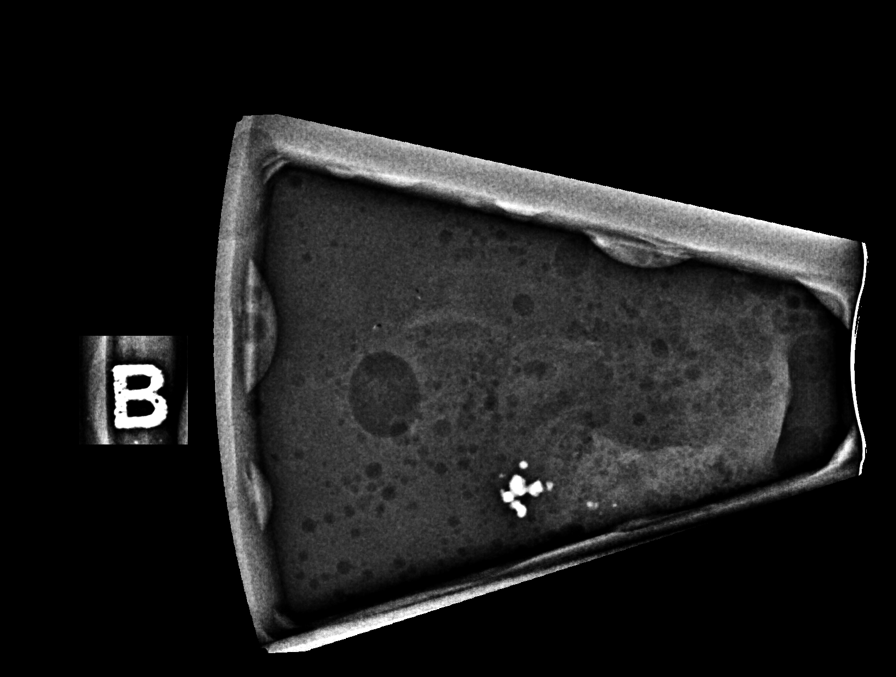

[R CC]
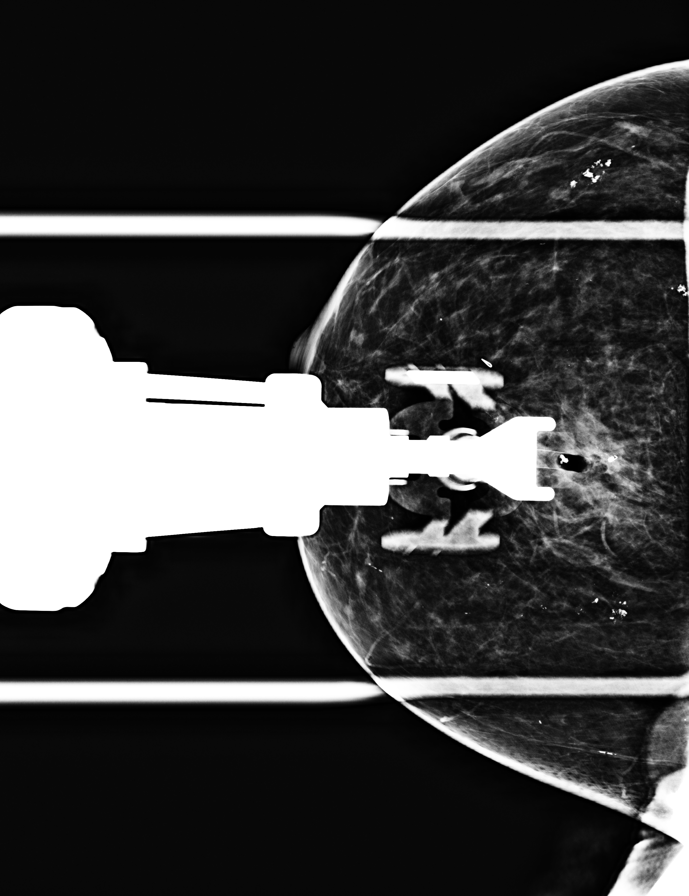

[8 of 18 positions shown; findings below may reference images not displayed]



Using sterile technique and 1% Lidocaine as local anesthetic, under
stereotactic guidance, a 9 gauge vacuum assisted device was used to
perform core needle biopsy of calcifications in the posterior right
breast using a superior approach. Specimen radiograph was performed
showing calcifications in 2 core specimen. Specimens with
calcifications are identified for pathology.

Lesion quadrant: Right breast, posterior

At the conclusion of the procedure, a coil shaped tissue marker clip
was deployed into the biopsy cavity. Follow-up 2-view mammogram was
performed and dictated separately.
IMPRESSION: Stereotactic-guided biopsy of right breast calcifications. No
apparent complications.

ADDENDUM:
Pathology revealed ATYPICAL DUCTAL HYPERPLASIA, FOCAL,
CALCIFICATIONS of the RIGHT breast, posterior. Note: It is uncertain
if the atypia is secondary to patient's recent chemotherapy. This
was found to be concordant by Dr. Kashu Sunita, with excision
recommended.

Pathology results will be discussed with Dr. David Ezequiel Alarcon Mamani of [facility] during scheduled office visit on February 21, 2020. The patient reported doing well after the biopsy with
tenderness at the site. Post biopsy instructions and care were
reviewed and questions were answered. The patient was encouraged to
call The [[facility]] for any additional
concerns.

Recommendation is bracket entire area (5.8 cm span) for excision.

Pathology results reported by Blain Jumper RN on 02/20/2020.



Using sterile technique and 1% Lidocaine as local anesthetic, under
stereotactic guidance, a 9 gauge vacuum assisted device was used to
perform core needle biopsy of calcifications in the posterior right
breast using a superior approach. Specimen radiograph was performed
showing calcifications in 2 core specimen. Specimens with
calcifications are identified for pathology.

Lesion quadrant: Right breast, posterior

At the conclusion of the procedure, a coil shaped tissue marker clip
was deployed into the biopsy cavity. Follow-up 2-view mammogram was
performed and dictated separately.
IMPRESSION: Stereotactic-guided biopsy of right breast calcifications. No
apparent complications.

## 2022-05-14 IMAGING — MG MM BREAST LOCALIZATION CLIP
4 series · 4 of 12 positions shown · non-contrast
Comparison: Previous exam(s).

CLINICAL DATA: Evaluate biopsy marker

EXAM:
DIAGNOSTIC RIGHT MAMMOGRAM POST STEREOTACTIC BIOPSY

[R CC synth-2D]
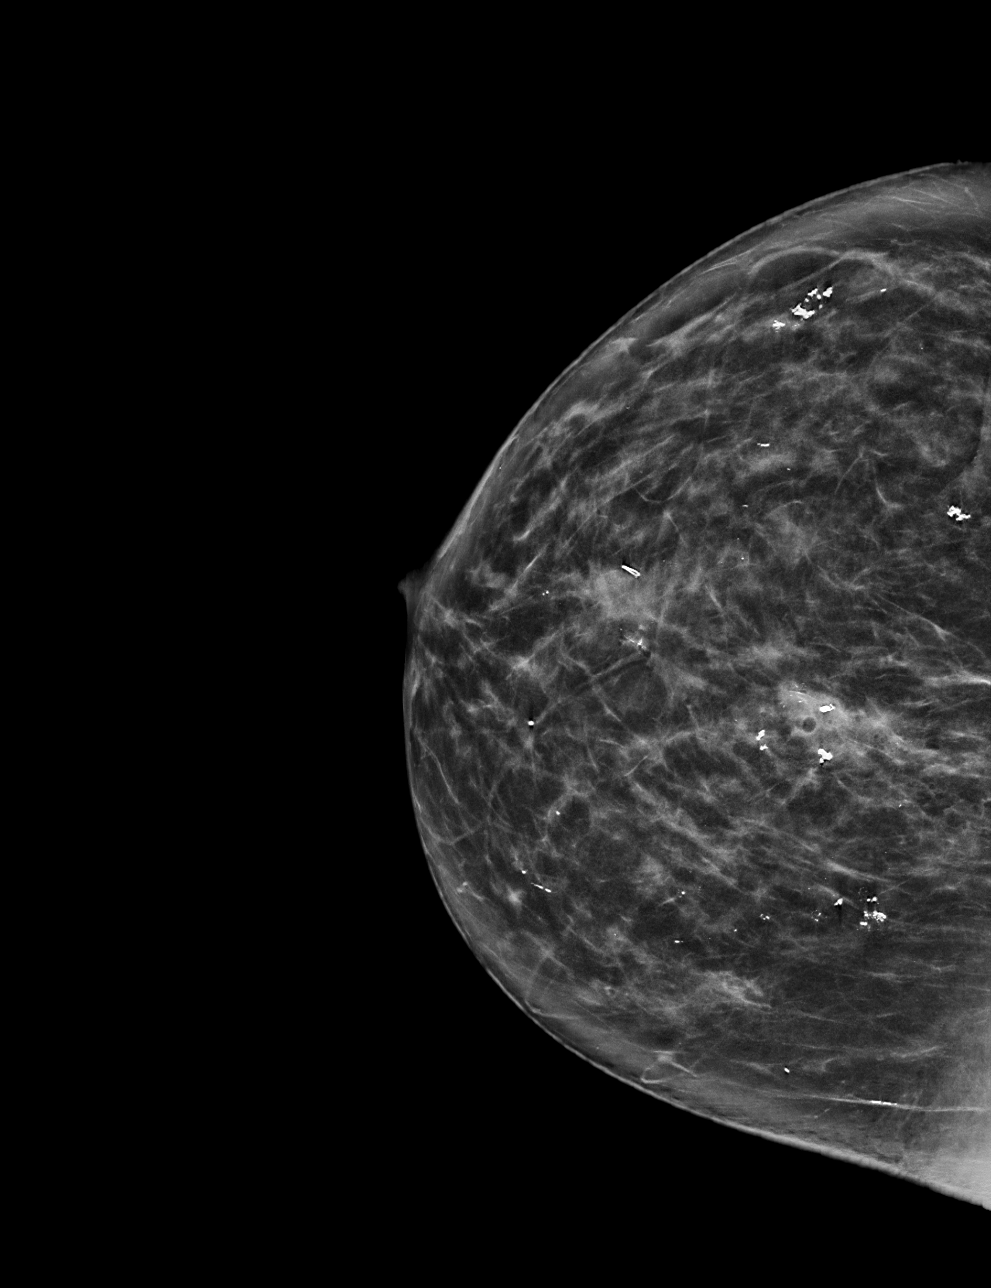

[R ML synth-2D]
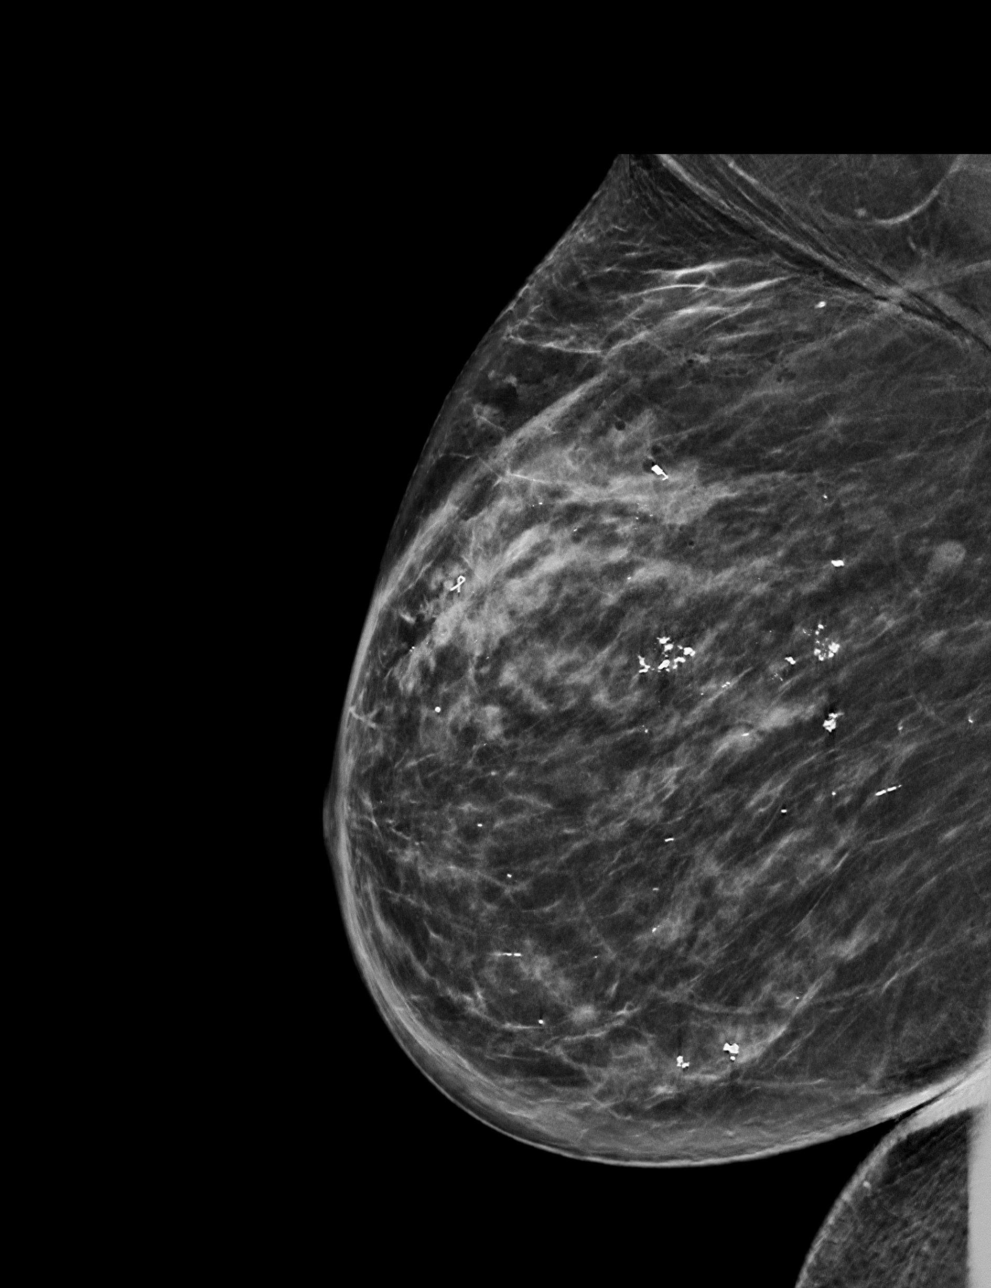

[R ML tomo · tomo slice 31/61.0]
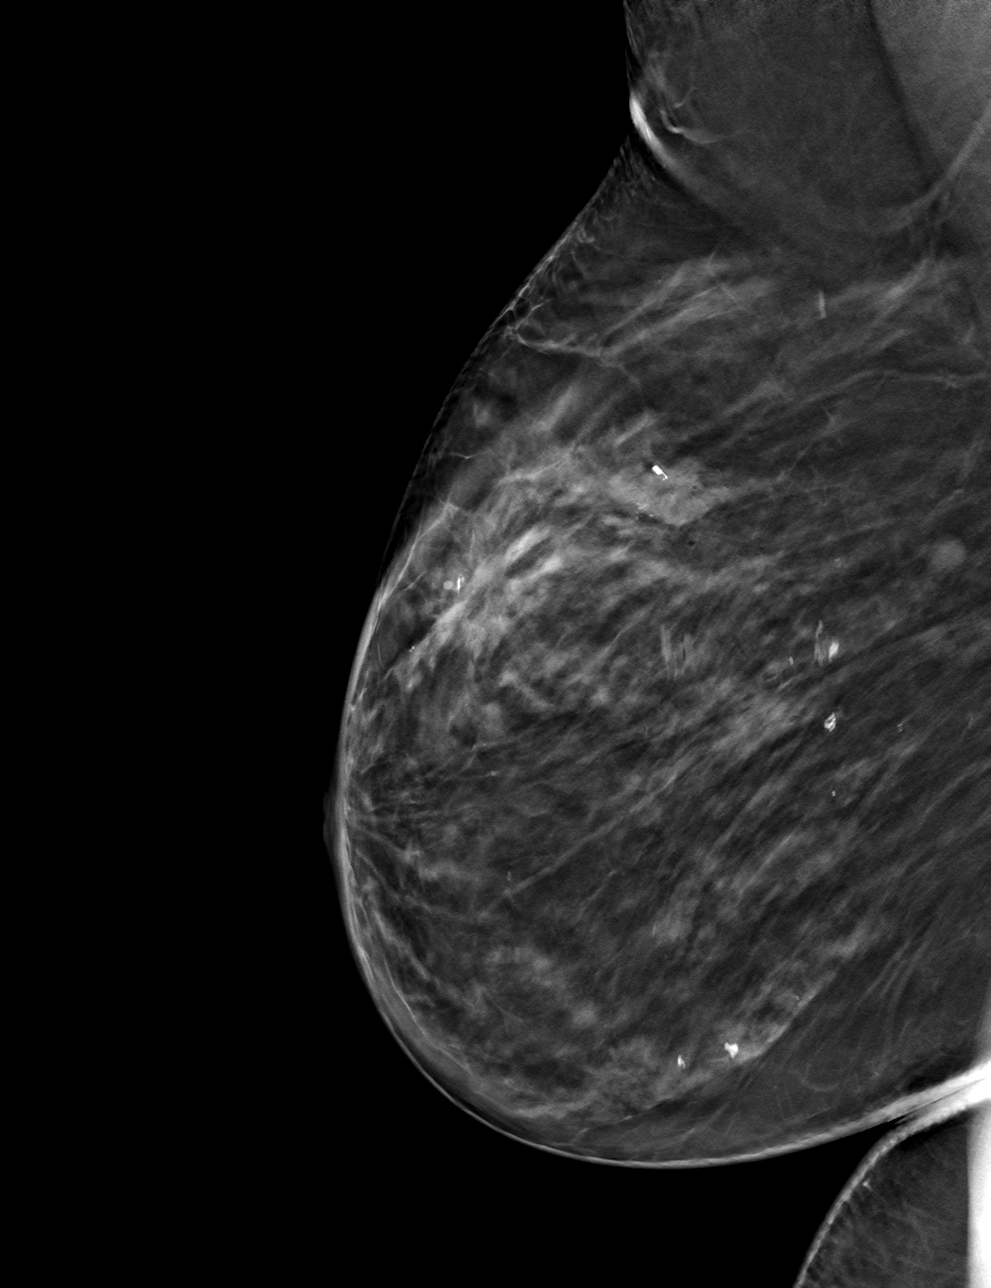

[R CC tomo · tomo slice 29/57.0]
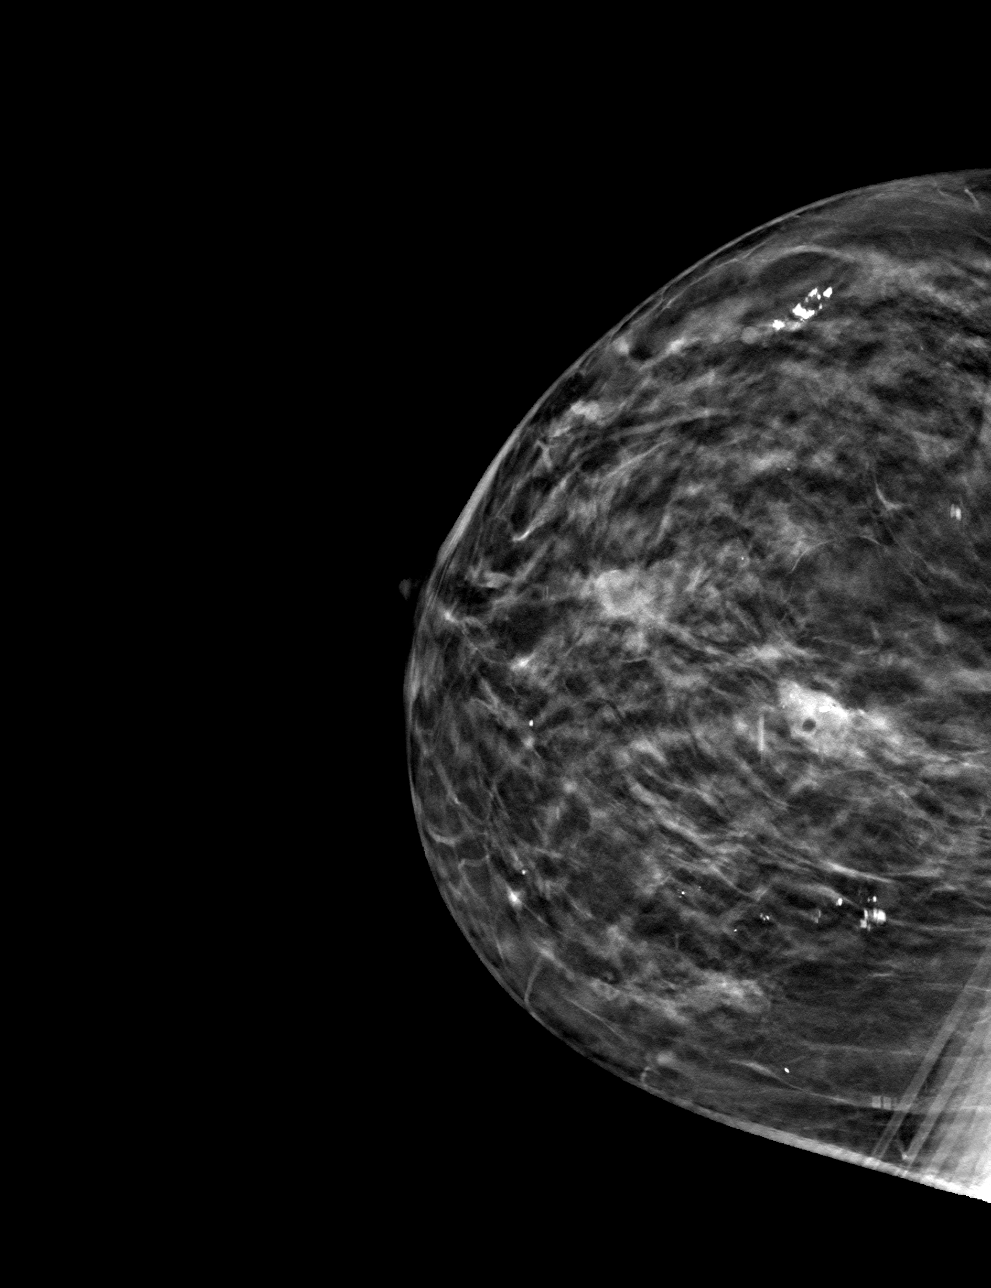

[4 of 12 positions shown; findings below may reference images not displayed]

FINDINGS: Mammographic images were obtained following stereotactic guided
biopsy of right breast calcifications. The biopsy marking clip is in
expected position at the site of biopsy.
IMPRESSION: Appropriate positioning of the coil shaped biopsy marking clip at
the site of biopsy in the location of the biopsied right breast
calcifications.

Final Assessment: Post Procedure Mammograms for Marker Placement

## 2022-10-12 ENCOUNTER — Other Ambulatory Visit: Payer: Medicare Other

## 2022-10-12 ENCOUNTER — Ambulatory Visit: Payer: Medicare Other | Admitting: Oncology

## 2022-10-25 NOTE — Progress Notes (Signed)
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  She is status post neoadjuvant trastuzumab/pertuzumab/paclitaxel.  She had an excellent response to this, with just a tiny residual invasive carcinoma. She received 4 cycles of adjuvant Kadcyla but this was discontinued due to toxicities. She went on to have single agent trastuzumab to complete 1 year of HER2 targeted therapy.   Right diagnostic mammogram in June did not reveal any evidence of malignancy.  Benign postlumpectomy changes.  Benign, stable, biopsied right breast asymmetry.  She is scheduled for bilateral diagnostic mammogram in December and to see Dr. Lequita Halt after that.  She does not have evidence of local recurrence but multiple symptoms concerning for possible metastatic disease. We will see her back after MRI evaluation.  Right arm weakness New weakness and pain of her right arm, consistent with cervical radiculopathy. Due to her history of HER2 receptor positive breast cancer, she is at increased risk for metastatic disease. I will obtain an MRI cervical spine for further evaluation.  Word finding difficulty Difficulty with word finding, in addition to the right arm weakness. Not oriented to year. She has a prior history of left sided stroke. Also, concerning for possible metastatic disease. I will obtain an MRI brain for further evaluation.  Upper back pain New onset upper back pain. Due to her HER2 receptor positive breast cancer. I will obtain an MRI thoracic spine to evaluate for metastatic disease.  Anemia Mild anemia. Will evaluate for nutritional deficiency as  cause.  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    The patient and her daughter understand the plans discussed today and is in agreement with them.  She knows to contact our office if she develops concerns prior to her next appointment.    60 minutes was spent on patient care.  This included time spent preparing to see the patient (e.g., review of tests), obtaining and/or reviewing separately obtained history, counseling and educating the patient/family/caregiver, ordering medications, tests, or procedures; documenting clinical information in the electronic or other health record, independently interpreting results and communicating results to the patient/family/caregiver as well as coordination of care.      Brandy Perl, PA-C  [facility] [address] [name] [address] Dept: [phone] Dept Fax: [phone]   Orders Placed This Encounter  Procedures   MR Cervical Spine Wo Contrast    Standing Status:   Future    Standing Expiration Date:   10/26/2023    Order Specific Question:   What is the patient's sedation requirement?    Answer:   No Sedation    Order Specific Question:   Does the patient have a pacemaker or implanted devices?    Answer:   No    Order Specific Question:   Preferred imaging location?    Answer:   External   MR Brain Wo Contrast    Standing Status:   Future    Standing Expiration Date:   10/26/2023    Scheduling Instructions:     RH    Order Specific Question:   What is the patient's sedation requirement?  Answer:   No Sedation    Order Specific Question:   Does the patient have a pacemaker or implanted devices?    Answer:   No    Order Specific Question:   Use SRS Protocol?    Answer:   No    Order Specific Question:   Preferred imaging location?    Answer:   External   MR Thoracic Spine Wo Contrast    Standing  Status:   Future    Standing Expiration Date:   10/26/2023    Scheduling Instructions:     RH    Order Specific Question:   What is the patient's sedation requirement?    Answer:   No Sedation    Order Specific Question:   Does the patient have a pacemaker or implanted devices?    Answer:   No    Order Specific Question:   Preferred imaging location?    Answer:   External    Order Specific Question:   Use SRS Protocol?    Answer:   No      CHIEF COMPLAINT:  CC: Stage IB HER2 receptor positive breast cancer  Current Treatment:  Observation  HISTORY OF PRESENT ILLNESS:   Oncology History  Malignant neoplasm of upper-outer quadrant of right breast in female, estrogen receptor positive (HCC)  12/18/2019 Cancer Staging   Staging form: Breast, AJCC 8th Edition - Clinical stage from 12/18/2019: Stage IB (cT2, cN0, cM0, G3, ER+, PR+, HER2+) - Signed by [name], MD on 01/19/2020   01/08/2020 Initial Diagnosis   Breast cancer, right (HCC)   01/28/2020 - 03/26/2020 Chemotherapy    Patient is on Treatment Plan: BREAST TRASTUZUMAB + PERTUZUMAB Q21D X 11 CYCLES      06/01/2020 - 06/01/2020 Chemotherapy         06/17/2020 - 07/29/2020 Chemotherapy         09/17/2020 - 04/16/2021 Chemotherapy   Patient is on Treatment Plan : BREAST Trastuzumab q21d X 11 Cycles         INTERVAL HISTORY:  Brandy Jacobs is here today for repeat clinical assessment.  She reports increasing general weakness. She has had upper back pain and pain in her arms, with weakness of the right arm. She has occasional neck pain. She has difficulty ambulating due to weakness. She uses a walker. Her daughter reports difficulty with word finding. She denies fevers or chills. Her appetite is good. Her weight has increased 5 pounds over last 6 months .  REVIEW OF SYSTEMS:  Review of Systems  Constitutional:  Positive for fatigue. Negative for appetite change, chills, fever and unexpected weight change.  HENT:   Negative  for lump/mass, mouth sores and sore throat.   Respiratory:  Negative for cough and shortness of breath.   Cardiovascular:  Negative for chest pain and leg swelling.  Gastrointestinal:  Negative for abdominal pain, constipation, diarrhea, nausea and vomiting.  Genitourinary:  Negative for difficulty urinating, dysuria, frequency and hematuria.   Musculoskeletal:  Positive for back pain, gait problem (uses walker) and neck pain. Negative for arthralgias and myalgias.  Skin:  Negative for rash.  Neurological:  Positive for extremity weakness and gait problem (uses walker). Negative for dizziness and headaches.  Hematological:  Negative for adenopathy. Does not bruise/bleed easily.  Psychiatric/Behavioral:  Negative for depression and sleep disturbance. The patient is not nervous/anxious.      VITALS:  Blood pressure (!) 148/66, pulse 60, temperature 98.2 F (36.8 C), temperature source Oral, resp. rate 18,  height 5' 4.5" (1.638 m), weight 139 lb 9.6 oz (63.3 kg), SpO2 93%.  Wt Readings from Last 3 Encounters:  10/26/22 139 lb 9.6 oz (63.3 kg)  04/14/22 134 lb 11.2 oz (61.1 kg)  10/15/21 136 lb 1.6 oz (61.7 kg)    Body mass index is 23.59 kg/m.  Performance status (ECOG): 2 - Symptomatic, <50% confined to bed  PHYSICAL EXAM:  Physical Exam Vitals and nursing note reviewed.  Constitutional:      General: She is not in acute distress.    Appearance: She is ill-appearing (chronically ill appearing).  HENT:     Head: Normocephalic and atraumatic.     Mouth/Throat:     Mouth: Mucous membranes are moist.     Pharynx: Oropharynx is clear. No oropharyngeal exudate or posterior oropharyngeal erythema.  Eyes:     General: No scleral icterus.    Extraocular Movements: Extraocular movements intact.     Conjunctiva/sclera: Conjunctivae normal.     Pupils: Pupils are equal, round, and reactive to light.  Cardiovascular:     Rate and Rhythm: Normal rate and regular rhythm.     Heart sounds:  Normal heart sounds. No murmur heard.    No friction rub. No gallop.  Pulmonary:     Effort: Pulmonary effort is normal.     Breath sounds: Normal breath sounds. No wheezing or rales.  Chest:  Breasts:    Right: Normal.     Left: Normal.  Abdominal:     General: There is no distension.     Palpations: Abdomen is soft. There is no mass.     Tenderness: There is no abdominal tenderness.  Musculoskeletal:        General: Normal range of motion.     Cervical back: Normal range of motion and neck supple. No tenderness.     Right lower leg: No edema.     Left lower leg: No edema.  Lymphadenopathy:     Cervical: No cervical adenopathy.     Upper Body:     Right upper body: No supraclavicular or axillary adenopathy.     Left upper body: No supraclavicular or axillary adenopathy.  Skin:    General: Skin is warm and dry.     Coloration: Skin is not jaundiced.     Findings: No rash.  Neurological:     Mental Status: She is alert and oriented to person, place, and time.     Cranial Nerves: No cranial nerve deficit.     Sensory: Sensation is intact.     Motor: Weakness (right upper extremity) present.     Comments: She is oriented to person, place, month, day and date, but not year. Motor strength is 3+/5 right upper extremity with 4/5 grip. She uses her left arm to lift her right arm. She denies dropping items with the right hand. When she tries to describe how she manages at home she cannot think of the word for end table and recliner.  Psychiatric:        Mood and Affect: Mood normal.        Behavior: Behavior normal.        Thought Content: Thought content normal.     LABS:      Latest Ref Rng & Units 10/26/2022    1:01 PM 04/14/2022    2:26 PM 10/15/2021   12:00 AM  CBC  WBC 4.0 - 10.5 K/uL 5.1  5.1  5.2      Hemoglobin 12.0 -  15.0 g/dL 16.1  09.6  04.5      Hematocrit 36.0 - 46.0 % 36.7  38.3  37      Platelets 150 - 400 K/uL 172  179  182         This result is from an  external source.      Latest Ref Rng & Units 10/26/2022    1:01 PM 04/14/2022    2:26 PM 10/15/2021   12:00 AM  CMP  Glucose 70 - 99 mg/dL 409  811    BUN 8 - 23 mg/dL 16  16  21       Creatinine 0.44 - 1.00 mg/dL 9.14  7.82  1.0      Sodium 135 - 145 mmol/L 140  136  135      Potassium 3.5 - 5.1 mmol/L 4.1  4.2  4.8      Chloride 98 - 111 mmol/L 105  103  102      CO2 22 - 32 mmol/L 27  27  30       Calcium 8.9 - 10.3 mg/dL 95.6  21.3  08.6      Total Protein 6.5 - 8.1 g/dL 7.1  7.1    Total Bilirubin 0.3 - 1.2 mg/dL 1.4  1.5    Alkaline Phos 38 - 126 U/L 77  61  75      AST 15 - 41 U/L 22  22  30       ALT 0 - 44 U/L 14  14  18          This result is from an external source.     No results found for: "CEA1", "CEA" / No results found for: "CEA1", "CEA" No results found for: "PSA1" No results found for: "VHQ469" No results found for: "CAN125"  No results found for: "TOTALPROTELP", "ALBUMINELP", "A1GS", "A2GS", "BETS", "BETA2SER", "GAMS", "MSPIKE", "SPEI" No results found for: "TIBC", "FERRITIN", "IRONPCTSAT" No results found for: "LDH"  STUDIES:  No results found.    HISTORY:   Past Medical History:  Diagnosis Date   Anemia 10/26/2022   Arthritis    osteoarthritis   Breast cancer (HCC)    Diabetes mellitus without complication (HCC)    not on medication   History of CVA (cerebrovascular accident)    Hypertension    Multiple thyroid nodules    benign   Right arm weakness 10/26/2022   Upper back pain 10/26/2022   Word finding difficulty 10/26/2022    Past Surgical History:  Procedure Laterality Date   BIOPSY THYROID     benign   BREAST BIOPSY     BREAST LUMPECTOMY W/ NEEDLE LOCALIZATION Right 05/04/2020    Family History  Problem Relation Age of Onset   Colon cancer Father        77s   Breast cancer Sister        76-60   Colon cancer Brother        31s   Prostate cancer Brother        late 60s   Breast cancer Half-Sister        late 43s early 56s     Social History:  reports that she has never smoked. She has never used smokeless tobacco. She reports that she does not currently use alcohol. She reports that she does not use drugs.The patient is accompanied by her daughter today.  Allergies: No Known Allergies  Current Medications: Current Outpatient Medications  Medication Sig Dispense Refill   acetaminophen (  TYLENOL) 500 MG tablet Take 500 mg by mouth at bedtime.     aspirin EC 81 MG tablet Take 81 mg by mouth daily. Swallow whole.     atorvastatin (LIPITOR) 40 MG tablet Take 40 mg by mouth daily.     losartan (COZAAR) 50 MG tablet Take 50 mg by mouth daily.     triamcinolone cream (KENALOG) 0.1 % SMARTSIG:1 Application Topical 2-3 Times Daily     Vitamin D3 (VITAMIN D) 25 MCG tablet Take 1,000 Units by mouth daily.     No current facility-administered medications for this visit.

## 2022-10-26 ENCOUNTER — Inpatient Hospital Stay: Payer: Medicare Other | Admitting: Hematology and Oncology

## 2022-10-26 ENCOUNTER — Encounter: Payer: Self-pay | Admitting: Hematology and Oncology

## 2022-10-26 ENCOUNTER — Inpatient Hospital Stay: Payer: Medicare Other | Attending: Hematology and Oncology

## 2022-10-26 VITALS — BP 148/66 | HR 60 | Temp 98.2°F | Resp 18 | Ht 64.5 in | Wt 139.6 lb

## 2022-10-26 DIAGNOSIS — R29898 Other symptoms and signs involving the musculoskeletal system: Secondary | ICD-10-CM

## 2022-10-26 DIAGNOSIS — M549 Dorsalgia, unspecified: Secondary | ICD-10-CM | POA: Insufficient documentation

## 2022-10-26 DIAGNOSIS — Z8673 Personal history of transient ischemic attack (TIA), and cerebral infarction without residual deficits: Secondary | ICD-10-CM

## 2022-10-26 DIAGNOSIS — C50411 Malignant neoplasm of upper-outer quadrant of right female breast: Secondary | ICD-10-CM | POA: Insufficient documentation

## 2022-10-26 DIAGNOSIS — D649 Anemia, unspecified: Secondary | ICD-10-CM | POA: Diagnosis not present

## 2022-10-26 DIAGNOSIS — R531 Weakness: Secondary | ICD-10-CM | POA: Diagnosis not present

## 2022-10-26 DIAGNOSIS — Z170 Estrogen receptor positive status [ER+]: Secondary | ICD-10-CM

## 2022-10-26 DIAGNOSIS — R4789 Other speech disturbances: Secondary | ICD-10-CM

## 2022-10-26 DIAGNOSIS — Z79899 Other long term (current) drug therapy: Secondary | ICD-10-CM | POA: Diagnosis not present

## 2022-10-26 HISTORY — DX: Dorsalgia, unspecified: M54.9

## 2022-10-26 HISTORY — DX: Anemia, unspecified: D64.9

## 2022-10-26 HISTORY — DX: Other speech disturbances: R47.89

## 2022-10-26 HISTORY — DX: Other symptoms and signs involving the musculoskeletal system: R29.898

## 2022-10-26 LAB — CBC WITH DIFFERENTIAL (CANCER CENTER ONLY)
Abs Immature Granulocytes: 0.05 10*3/uL (ref 0.00–0.07)
Basophils Absolute: 0.1 10*3/uL (ref 0.0–0.1)
Basophils Relative: 2 %
Eosinophils Absolute: 0.2 10*3/uL (ref 0.0–0.5)
Eosinophils Relative: 4 %
HCT: 36.7 % (ref 36.0–46.0)
Hemoglobin: 11.2 g/dL — ABNORMAL LOW (ref 12.0–15.0)
Immature Granulocytes: 1 %
Lymphocytes Relative: 21 %
Lymphs Abs: 1.1 10*3/uL (ref 0.7–4.0)
MCH: 30.8 pg (ref 26.0–34.0)
MCHC: 30.5 g/dL (ref 30.0–36.0)
MCV: 100.8 fL — ABNORMAL HIGH (ref 80.0–100.0)
Monocytes Absolute: 0.5 10*3/uL (ref 0.1–1.0)
Monocytes Relative: 10 %
Neutro Abs: 3.2 10*3/uL (ref 1.7–7.7)
Neutrophils Relative %: 62 %
Platelet Count: 172 10*3/uL (ref 150–400)
RBC: 3.64 MIL/uL — ABNORMAL LOW (ref 3.87–5.11)
RDW: 13.9 % (ref 11.5–15.5)
WBC Count: 5.1 10*3/uL (ref 4.0–10.5)
nRBC: 0 % (ref 0.0–0.2)

## 2022-10-26 LAB — CMP (CANCER CENTER ONLY)
ALT: 14 U/L (ref 0–44)
AST: 22 U/L (ref 15–41)
Albumin: 4 g/dL (ref 3.5–5.0)
Alkaline Phosphatase: 77 U/L (ref 38–126)
Anion gap: 8 (ref 5–15)
BUN: 16 mg/dL (ref 8–23)
CO2: 27 mmol/L (ref 22–32)
Calcium: 10.3 mg/dL (ref 8.9–10.3)
Chloride: 105 mmol/L (ref 98–111)
Creatinine: 0.8 mg/dL (ref 0.44–1.00)
GFR, Estimated: >60 mL/min (ref >60)
Glucose, Bld: 110 mg/dL — ABNORMAL HIGH (ref 70–99)
Potassium: 4.1 mmol/L (ref 3.5–5.1)
Sodium: 140 mmol/L (ref 135–145)
Total Bilirubin: 1.4 mg/dL — ABNORMAL HIGH (ref 0.3–1.2)
Total Protein: 7.1 g/dL (ref 6.5–8.1)

## 2022-10-26 NOTE — Assessment & Plan Note (Signed)
New weakness and pain of her right arm, consistent with cervical radiculopathy. Due to her history of HER2 receptor positive breast cancer, she is at increased risk for metastatic disease. I will obtain an MRI cervical spine for further evaluation.

## 2022-10-26 NOTE — Assessment & Plan Note (Addendum)
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  She is status post neoadjuvant trastuzumab/pertuzumab/paclitaxel.  She had an excellent response to this, with just a tiny residual invasive carcinoma. She received 4 cycles of adjuvant Kadcyla but this was discontinued due to toxicities. She went on to have single agent trastuzumab to complete 1 year of HER2 targeted therapy.   Right diagnostic mammogram in June did not reveal any evidence of malignancy.  Benign postlumpectomy changes.  Benign, stable, biopsied right breast asymmetry.  She is scheduled for bilateral diagnostic mammogram in December and to see Dr. Lequita Halt after that.  She does not have evidence of local recurrence but multiple symptoms concerning for possible metastatic disease. We will see her back after MRI evaluation.

## 2022-10-26 NOTE — Assessment & Plan Note (Signed)
New onset upper back pain. Due to her HER2 receptor positive breast cancer. I will obtain an MRI thoracic spine to evaluate for metastatic disease.

## 2022-10-26 NOTE — Assessment & Plan Note (Signed)
Difficulty with word finding, in addition to the right arm weakness. Not oriented to year. She has a prior history of left sided stroke. Also, concerning for possible metastatic disease. I will obtain an MRI brain for further evaluation.

## 2022-10-26 NOTE — Assessment & Plan Note (Signed)
Mild anemia. Will evaluate for nutritional deficiency as cause.

## 2022-10-26 NOTE — Assessment & Plan Note (Signed)
Mild hyperbilirubinemia due to disorder of bilirubin metabolism, which is stable.

## 2022-10-27 ENCOUNTER — Telehealth: Payer: Self-pay

## 2022-10-27 ENCOUNTER — Other Ambulatory Visit: Payer: Self-pay

## 2022-10-27 DIAGNOSIS — D649 Anemia, unspecified: Secondary | ICD-10-CM

## 2022-10-27 DIAGNOSIS — C50411 Malignant neoplasm of upper-outer quadrant of right female breast: Secondary | ICD-10-CM | POA: Diagnosis not present

## 2022-10-27 LAB — IRON AND TIBC
Iron: 72 ug/dL (ref 28–170)
Saturation Ratios: 24 % (ref 10.4–31.8)
TIBC: 298 ug/dL (ref 250–450)
UIBC: 226 ug/dL

## 2022-10-27 LAB — VITAMIN B12: Vitamin B-12: 234 pg/mL (ref 180–914)

## 2022-10-27 LAB — FERRITIN: Ferritin: 96 ng/mL (ref 11–307)

## 2022-10-27 LAB — FOLATE: Folate: 16.9 ng/mL (ref >5.9)

## 2022-10-27 MED ORDER — CYANOCOBALAMIN 500 MCG PO TABS: 500.0000 ug | ORAL_TABLET | Freq: Every day | ORAL | Status: DC

## 2022-10-27 NOTE — Telephone Encounter (Signed)
-----   Message from Adah Perl sent at 10/27/2022  1:42 PM EDT ----- Please let her daughter know her B12 was low normal, so I recommend she take over-the-counter B12 500 mcg daily.  Thank you

## 2022-10-27 NOTE — Telephone Encounter (Signed)
Patient's daughter Valentino Saxon has been made aware to have her mother start B12 500 mcg daily

## 2022-10-28 ENCOUNTER — Telehealth: Payer: Self-pay | Admitting: Hematology and Oncology

## 2022-10-28 NOTE — Telephone Encounter (Signed)
Patient has been scheduled. Aware of appt dates and times.   Scheduling Message Entered by Belva Crome A on 10/26/2022 at  2:44 PM Priority: High <No visit type provided>  Department: [facility]  Provider:  Scheduling Notes:  MRI brain, cervical spine and thoracic spine. Right upper extremity weakness and pain, trouble word finding, upper back pain, h/o breast cancer, h/o stroke. F/U with Dr. Shyrl Numbers after done.

## 2022-11-09 NOTE — Progress Notes (Addendum)
[facility]  [address] 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  She is status post neoadjuvant trastuzumab/pertuzumab/paclitaxel.  She had an excellent response to this, with just a tiny residual invasive carcinoma. She received 4 cycles of adjuvant Kadcyla but this was discontinued due to toxicities. She went on to have single agent trastuzumab to complete 1 year of HER2 targeted therapy. Right diagnostic mammogram in June did not reveal any evidence of malignancy.  Benign postlumpectomy changes.  Benign, stable, biopsied right breast asymmetry.  She is scheduled for bilateral diagnostic mammogram in December and to see Dr. Lequita Halt after that. She does not have evidence of local recurrence but multiple symptoms concerning for possible metastatic disease. Her MRI scans are suspicious for recurrent breast cancer.   Right arm weakness New weakness and pain of her right arm, consistent with cervical radiculopathy. Due to her history of HER2 receptor positive breast cancer, she is at increased risk for metastatic disease. CT of thoracic spine on 11/01/2022 showed there is likely evidence of epidural extension of tumor at the T7 level with likely at least mild spinal canal narrowing/. I will therefore refer her to radiation oncology.     Word finding difficulty Difficulty with word finding, in addition to the right arm weakness. Not oriented to year. She has a prior history of left sided stroke. Also, concerning for possible metastatic disease. MRI of the brain done on 11/01/2022 shows no acute intracranial abnormality and moderate to severe chronic small vessel ischemic  disease with multiple chronic infarctions.   Upper back pain New onset upper back pain. Due to her HER2 receptor positive breast cancer. MRI of the thoracic spine done on 11/01/2022 revealed multiple T2 hyperintense lesions throughout the thoracic spine, concerning for metastatic disease, there is likely evidence of epidural extension of tumor at the T7 level with likely at least mild spinal canal narrowing, and non-specific T2 hyperintense lesion in the posterior right hepatic lobe measuring 6mm, so she has a possible live metastasis as well.   Anemia Mild anemia. On 10/27/2022 her vitamin B-12, folate, ferritin, iron, and TIBC were all normal.    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  Plan: I will prescribe Tramadol 50mg  every 6 hours prn. She had a MRI of the cervical spine done on 11/01/2022 that revealed sub-centimeter T1 hypointense, stir hyperintense lesion in the C6 vertebral body suspicious for metastatic disease, multiple degenerative change throughout the cervical spine resulting in up to mild to moderate spinal canal stenosis at C3-C4 and C4-C5, and moderate right neural foraminal stenosis at C6-C7 with degenerative disc disease most advanced at C6-C7. MRI of the thoracic spine done on the same day that revealed multiple T2 hyperintense lesions throughout the thoracic spine, concerning for metastatic disease.  There is likely evidence of epidural extension of tumor at the T7 level with at least mild spinal canal narrowing, and non-specific T2 hyperintense lesion in the posterior right hepatic lobe measuring 6 mm. MRI of the head also done on 11/01/2022 revealed no acute intracranial abnormality and moderate to severe chronic small vessel ischemic disease with multiple chronic infarctions. Since her original cancer was HER2+, I informed her  that this would also likely be HER2+, and there are multiple treatments that she can try  even going back to Herceptin. I recommended she have an MRI with contrast for further evaluation. I will schedule a MRI of the T-spine and LS-spine with contrast. I will also schedule a PET to complete staging since there is a suspicious lesion in the liver as well. I will schedule a consultation with Dr. Thersa Salt. She had labs done on 10/26/2022. I will schedule an echocardiogram and she may need to have a biopsy done after the PET scan. Her WBC is 5.1, hemoglobin dropped from 12.0 to 11.2, and platelet count of 172,000 and her calcium was elevated at 10.4, and total bilirubin of 1.5. On 10/27/2022 her vitamin B-12, folate, ferritin, iron, and TIBC were all normal. I will see her back in 2-3 weeks, after tests are done with CBC, CMP,  CEA, CA27.29, SPEP, PT, and PTT. The patient and her daughter understand the plans discussed today and is in agreement with them.  She knows to contact our office if she develops concerns prior to her next appointment.   I provided 23 minutes of face-to-face time during this this encounter and > 50% was spent counseling as documented under my assessment and plan.    [name], MD  [facility] [address] [name] [address] Dept: [phone] Dept Fax: [phone]   No orders of the defined types were placed in this encounter.   CHIEF COMPLAINT:  CC: Stage IB HER2 receptor positive breast cancer  Current Treatment:  Observation  HISTORY OF PRESENT ILLNESS:   Oncology History  Malignant neoplasm of upper-outer quadrant of right breast in female, estrogen receptor positive (HCC)  12/18/2019 Cancer Staging   Staging form: Breast, AJCC 8th Edition - Clinical stage from 12/18/2019: Stage IB (cT2, cN0, cM0, G3, ER+, PR+, HER2+) - Signed by [name], MD on 01/19/2020   01/08/2020 Initial Diagnosis   Breast cancer, right (HCC)   01/28/2020 - 03/26/2020 Chemotherapy    Patient  is on Treatment Plan: BREAST TRASTUZUMAB + PERTUZUMAB Q21D X 11 CYCLES      06/01/2020 - 06/01/2020 Chemotherapy         06/17/2020 - 07/29/2020 Chemotherapy         09/17/2020 - 04/16/2021 Chemotherapy   Patient is on Treatment Plan : BREAST Trastuzumab q21d X 11 Cycles         INTERVAL HISTORY:  Brandy Jacobs is here today for repeat clinical assessment stage IB HER2 receptor positive breast cancer. Patient states that she feels ok but complains of right arm pain and a pain behind her right breast that has persisted for several months. She informed me that her arms are weak to the point where she cannot lift them. She takes tylenol for pain relief occasionally. I will prescribe  Tramadol 50mg  Q6hrs prn. She had a MRI of the cervical spine done on 11/01/2022 that revealed sub-centimeter T1 hypointense, stir hyperintense lesion in the C6 vertebral body suspicious for metastatic disease or multiple myeloma, multiple degenerative change throughout the cervical spine resulting in up to mild to moderate spinal canal stenosis at C3-C4 and C4-C5, and moderate right neural foraminal stenosis at C6-C7 with degenerative disc disease most advanced at C6-C7. MRI of the thoracic spine done on the same day that revealed multiple T2 hyperintense lesions throughout the thoracic spine, concerning for metastatic disease, there is likely evidence of epidural extension of  tumor at the T7 level with at least mild spinal canal narrowing, and non-specific T2 hyperintense lesion in the posterior right hepatic lobe measuring 6 mm. MRI of the head also done on 11/01/2022 revealed no acute intracranial abnormality and moderate to severe chronic small vessel ischemic disease with multiple chronic infarctions. Since her original cancer was HER2+, I informed her that this would also likely be HER2+, and there are multiple treatments that she can try even going back to Herceptin. I recommended she have a MRI with contrast for further  evaluation. I will schedule a MRI of the T-spine and LS-spine with contrast. I will also schedule a PET to complete staging since there is a suspicious lesion in the liver as well. I will schedule a consultation with Dr. Thersa Salt. She had labs done on 10/26/2022. I will schedule her a echocardiogram and she may need to have a biopsy done after the PET scan. Her WBC is 5.1, hemoglobin dropped from 12.0 to 11.2, and platelet count of 172,000 and her calcium was elevated at 10.4, and total bilirubin of 1.5. On 10/27/2022 her vitamin B-12, folate, ferritin, iron, and TIBC were all normal. I will see her back in 2 weeks after tests are done with CBC, CMP, CEA, CA27.29, SPEP, PT, and PTT. She denies signs of infection such as sore throat, sinus drainage, cough, or urinary symptoms.  She denies fevers or recurrent chills. She denies pain. She denies nausea, vomiting, chest pain, dyspnea or cough. Her appetite is good and her weight has increased 7 pounds over last 7 months .   Exam: 11/01/2022 MRI of Cervical Spine without Contrast Impression: Sub-centimeter T1 hypointense, stir hyperintense lesion in the C6 vertebral body suspicious for metastatic disease or multiple myeloma.  Multiple degenerative change throughout the cervical spine resulting in up to mild to moderate spinal canal stenosis at C3-C4 and C4-C5, and moderate right neural foraminal stenosis at C6-C7 with degenerative disc disease most advanced at C6-C7.  Thoracic spine MRI reported separately.    Exam: 11/01/2022 MRI of Thoracic Spine without Contrast Impression:  multiple T2 hyperintense lesions throughout the thoracic spine, as above, concerning for metastatic disease. There is likely evidence of epidural extension of tumor at the T7 level with likely at least mild spinal canal narrowing. Recommend contrast enhanced MRI of the thoracic spine for further evaluation.  Non-specific T2 hyperintense lesion in the posterior right hepatic lobe  measuring 6mm. Recommend further evaluation with dedicated abdominal MRI with and without contrast.    Exam: 11/01/2022 MRI Head without Contrast Impression: No acute intracranial abnormality. Moderate to severe chronic small vessel ischemic disease with multiple chronic infarcts as above.   REVIEW OF SYSTEMS:  Review of Systems  Constitutional:  Positive for fatigue. Negative for appetite change, chills, diaphoresis, fever and unexpected weight change.  HENT:  Negative.  Negative for hearing loss, lump/mass, mouth sores, nosebleeds, sore throat, tinnitus, trouble swallowing and voice change.   Eyes: Negative.  Negative for eye problems and icterus.  Respiratory: Negative.  Negative for chest tightness, cough, hemoptysis, shortness of breath and wheezing.   Cardiovascular: Negative.  Negative for chest pain, leg swelling and palpitations.  Gastrointestinal: Negative.  Negative for abdominal distention, abdominal pain, blood in stool, constipation, diarrhea, nausea, rectal pain and vomiting.  Endocrine: Negative.   Genitourinary: Negative.  Negative for bladder incontinence, difficulty urinating, dyspareunia, dysuria, frequency, hematuria, menstrual problem, nocturia, pelvic pain, vaginal bleeding and vaginal discharge.   Musculoskeletal:  Positive for back pain, gait problem (uses  walker) and neck pain. Negative for arthralgias, flank pain, myalgias and neck stiffness.       Right arm and shoulder pain with decreased ability to use the right upper extremity  Skin: Negative.  Negative for itching, rash and wound.  Neurological:  Positive for extremity weakness (right upper extremity) and gait problem (uses walker). Negative for dizziness, headaches, light-headedness, numbness, seizures and speech difficulty.  Hematological: Negative.  Negative for adenopathy. Does not bruise/bleed easily.  Psychiatric/Behavioral: Negative.  Negative for confusion, decreased concentration, depression, sleep  disturbance and suicidal ideas. The patient is not nervous/anxious.      Past Surgical History:  Procedure Laterality Date   BIOPSY THYROID     benign   BREAST BIOPSY     BREAST LUMPECTOMY W/ NEEDLE LOCALIZATION Right 05/04/2020     VITALS:  Blood pressure (!) 134/58, pulse 87, temperature 97.8 F (36.6 C), temperature source Oral, resp. rate 18, height 5' 4.5" (1.638 m), weight 141 lb 1.6 oz (64 kg), SpO2 100%.  Wt Readings from Last 3 Encounters:  11/10/22 141 lb 1.6 oz (64 kg)  10/26/22 139 lb 9.6 oz (63.3 kg)  04/14/22 134 lb 11.2 oz (61.1 kg)    Body mass index is 23.85 kg/m.  Performance status (ECOG): 2 - Symptomatic, <50% confined to bed  PHYSICAL EXAM:  Physical Exam Vitals and nursing note reviewed.  Constitutional:      General: She is not in acute distress.    Appearance: Normal appearance. She is normal weight. She is ill-appearing (chronically ill appearing). She is not toxic-appearing or diaphoretic.  HENT:     Head: Normocephalic and atraumatic.     Right Ear: Tympanic membrane, ear canal and external ear normal. There is no impacted cerumen.     Left Ear: Tympanic membrane, ear canal and external ear normal. There is no impacted cerumen.     Nose: Nose normal. No congestion or rhinorrhea.     Mouth/Throat:     Mouth: Mucous membranes are moist.     Pharynx: Oropharynx is clear. No oropharyngeal exudate or posterior oropharyngeal erythema.  Eyes:     General: No scleral icterus.       Right eye: No discharge.        Left eye: No discharge.     Extraocular Movements: Extraocular movements intact.     Conjunctiva/sclera: Conjunctivae normal.     Pupils: Pupils are equal, round, and reactive to light.  Neck:     Vascular: No carotid bruit.  Cardiovascular:     Rate and Rhythm: Normal rate and regular rhythm.     Pulses: Normal pulses.     Heart sounds: Normal heart sounds. No murmur heard.    No friction rub. No gallop.  Pulmonary:     Effort:  Pulmonary effort is normal. No respiratory distress.     Breath sounds: Normal breath sounds. No stridor. No wheezing, rhonchi or rales.  Chest:     Chest wall: No tenderness.  Breasts:    Right: Normal.     Left: Normal.  Abdominal:     General: Bowel sounds are normal. There is no distension.     Palpations: Abdomen is soft. There is no mass.     Tenderness: There is no abdominal tenderness. There is no right CVA tenderness, left CVA tenderness, guarding or rebound.     Hernia: No hernia is present.  Musculoskeletal:        General: No swelling, tenderness, deformity or signs of injury.  Normal range of motion.     Cervical back: Normal range of motion and neck supple. No rigidity or tenderness.     Right lower leg: No edema.     Left lower leg: No edema.  Lymphadenopathy:     Cervical: No cervical adenopathy.     Upper Body:     Right upper body: No supraclavicular or axillary adenopathy.     Left upper body: No supraclavicular or axillary adenopathy.  Skin:    General: Skin is warm and dry.     Coloration: Skin is not jaundiced or pale.     Findings: No bruising, erythema, lesion or rash.  Neurological:     General: No focal deficit present.     Mental Status: She is alert and oriented to person, place, and time. Mental status is at baseline.     Cranial Nerves: No cranial nerve deficit.     Sensory: Sensation is intact. No sensory deficit.     Motor: Weakness (right upper extremity) present.     Coordination: Coordination normal.     Gait: Gait normal.     Deep Tendon Reflexes: Reflexes normal.     Comments: She is oriented to person, place, month, day and date, but not year. Motor strength is 3+/5 right upper extremity with 4/5 grip. She uses her left arm to lift her right arm. She denies dropping items with the right hand. When she tries to describe how she manages at home she cannot think of the word for end table and recliner.  Psychiatric:        Mood and Affect: Mood  normal.        Behavior: Behavior normal.        Thought Content: Thought content normal.        Judgment: Judgment normal.     LABS:      Latest Ref Rng & Units 10/26/2022    1:01 PM 04/14/2022    2:26 PM 10/15/2021   12:00 AM  CBC  WBC 4.0 - 10.5 K/uL 5.1  5.1  5.2      Hemoglobin 12.0 - 15.0 g/dL 95.6  38.7  56.4      Hematocrit 36.0 - 46.0 % 36.7  38.3  37      Platelets 150 - 400 K/uL 172  179  182         This result is from an external source.      Latest Ref Rng & Units 10/26/2022    1:01 PM 04/14/2022    2:26 PM 10/15/2021   12:00 AM  CMP  Glucose 70 - 99 mg/dL 332  951    BUN 8 - 23 mg/dL 16  16  21       Creatinine 0.44 - 1.00 mg/dL 8.84  1.66  1.0      Sodium 135 - 145 mmol/L 140  136  135      Potassium 3.5 - 5.1 mmol/L 4.1  4.2  4.8      Chloride 98 - 111 mmol/L 105  103  102      CO2 22 - 32 mmol/L 27  27  30       Calcium 8.9 - 10.3 mg/dL 06.3  01.6  01.0      Total Protein 6.5 - 8.1 g/dL 7.1  7.1    Total Bilirubin 0.3 - 1.2 mg/dL 1.4  1.5    Alkaline Phos 38 - 126 U/L 77  61  75  AST 15 - 41 U/L 22  22  30       ALT 0 - 44 U/L 14  14  18          This result is from an external source.   Component Ref Range & Units 10/27/2022   Vitamin B-12 180 - 914 pg/mL 234   Component Ref Range & Units 10/27/2022  Folate >5.9 ng/mL 16.9   No results found for: "CEA1", "CEA" / No results found for: "CEA1", "CEA" No results found for: "PSA1" No results found for: "WUJ811" No results found for: "CAN125"  No results found for: "TOTALPROTELP", "ALBUMINELP", "A1GS", "A2GS", "BETS", "BETA2SER", "GAMS", "MSPIKE", "SPEI" Lab Results  Component Value Date   TIBC 298 10/27/2022   FERRITIN 96 10/27/2022   IRONPCTSAT 24 10/27/2022   No results found for: "LDH"  STUDIES:  No results found.    HISTORY:   Past Medical History:  Diagnosis Date   Anemia 10/26/2022   Arthritis    osteoarthritis   Breast cancer (HCC)    Diabetes mellitus without complication  (HCC)    not on medication   History of CVA (cerebrovascular accident)    Hypertension    Multiple thyroid nodules    benign   Right arm weakness 10/26/2022   Upper back pain 10/26/2022   Word finding difficulty 10/26/2022      Family History  Problem Relation Age of Onset   Colon cancer Father        5s   Breast cancer Sister        54-60   Colon cancer Brother        64s   Prostate cancer Brother        late 81s   Breast cancer Half-Sister        late 73s early 73s    Social History:  reports that she has never smoked. She has never used smokeless tobacco. She reports that she does not currently use alcohol. She reports that she does not use drugs.The patient is accompanied by her daughter today.  Allergies: No Known Allergies  Current Medications: Current Outpatient Medications  Medication Sig Dispense Refill   acetaminophen (TYLENOL) 500 MG tablet Take 500 mg by mouth at bedtime.     aspirin EC 81 MG tablet Take 81 mg by mouth daily. Swallow whole.     atorvastatin (LIPITOR) 40 MG tablet Take 40 mg by mouth daily.     cyanocobalamin (VITAMIN B12) 500 MCG tablet Take 1 tablet (500 mcg total) by mouth daily.     losartan (COZAAR) 50 MG tablet Take 50 mg by mouth daily.     traMADol (ULTRAM) 50 MG tablet Take 1 tablet (50 mg total) by mouth every 6 (six) hours as needed. 30 tablet 0   triamcinolone cream (KENALOG) 0.1 % SMARTSIG:1 Application Topical 2-3 Times Daily     Vitamin D3 (VITAMIN D) 25 MCG tablet Take 1,000 Units by mouth daily.     No current facility-administered medications for this visit.    I,Jasmine M Lassiter,acting as a scribe for [name], MD.,have documented all relevant documentation on the behalf of [name], MD,as directed by  [name], MD while in the presence of [name], MD.

## 2022-11-10 ENCOUNTER — Other Ambulatory Visit: Payer: Self-pay | Admitting: Oncology

## 2022-11-10 ENCOUNTER — Inpatient Hospital Stay: Payer: Medicare Other | Admitting: Oncology

## 2022-11-10 ENCOUNTER — Encounter: Payer: Self-pay | Admitting: Oncology

## 2022-11-10 VITALS — BP 134/58 | HR 87 | Temp 97.8°F | Resp 18 | Ht 64.5 in | Wt 141.1 lb

## 2022-11-10 DIAGNOSIS — C7951 Secondary malignant neoplasm of bone: Secondary | ICD-10-CM

## 2022-11-10 DIAGNOSIS — Z170 Estrogen receptor positive status [ER+]: Secondary | ICD-10-CM | POA: Diagnosis not present

## 2022-11-10 DIAGNOSIS — M810 Age-related osteoporosis without current pathological fracture: Secondary | ICD-10-CM

## 2022-11-10 DIAGNOSIS — C50411 Malignant neoplasm of upper-outer quadrant of right female breast: Secondary | ICD-10-CM

## 2022-11-10 MED ORDER — TRAMADOL HCL 50 MG PO TABS
50.0000 mg | ORAL_TABLET | Freq: Four times a day (QID) | ORAL | 0 refills | Status: DC | PRN
Start: 2022-11-10 — End: 2022-12-28

## 2022-11-14 ENCOUNTER — Telehealth: Payer: Self-pay | Admitting: Oncology

## 2022-11-14 ENCOUNTER — Encounter: Payer: Self-pay | Admitting: Oncology

## 2022-11-14 NOTE — Progress Notes (Unsigned)
No PA required: ECHO CPT 93306   Prior Authorization/Notification is not required for the requested service(s).  You are not required to submit a notification/prior authorization based on the information provided.   Decision ID #: [id number]  The number above acknowledges your inquiry and our response. Please write this number down and refer to it for future inquiries. Coverage and payment for an item or service is governed by the member's benefit plan document, and, if applicable, the provider's participation agreement with the H

## 2022-11-14 NOTE — Telephone Encounter (Signed)
Patient has been scheduled. Contacted pts daughter to give review appt information and to schedule FU appt but unable to reach via phone.      Scheduling Message Entered by Gery Pray H on 11/10/2022 at  4:33 PM Priority: High <No visit type provided>  Department: [facility]  Provider:  Scheduling Notes:  Sched Thoracic and LS spine MRI with contrast  Sched PET 10/30 - staging of recurrent breast cancer  RT after done  Refer Dr. Thersa Salt regarding palliative XRT     Scheduling Message Entered by Gery Pray H on 11/10/2022 at  6:15 PM Priority: Routine <No visit type provided>  Department: [facility]  Provider:  Scheduling Notes:  She will also need an ECHO, don't know where she had them before

## 2022-11-15 NOTE — Telephone Encounter (Signed)
All appts have been reviewed with pts daughter, follow up has also been scheduled.

## 2022-11-23 DIAGNOSIS — I361 Nonrheumatic tricuspid (valve) insufficiency: Secondary | ICD-10-CM | POA: Diagnosis not present

## 2022-11-24 ENCOUNTER — Other Ambulatory Visit: Payer: Self-pay | Admitting: Oncology

## 2022-11-24 ENCOUNTER — Telehealth: Payer: Self-pay | Admitting: Oncology

## 2022-11-24 ENCOUNTER — Encounter: Payer: Self-pay | Admitting: Oncology

## 2022-11-24 DIAGNOSIS — C7951 Secondary malignant neoplasm of bone: Secondary | ICD-10-CM

## 2022-11-24 NOTE — Telephone Encounter (Signed)
11/24/22 Spoke with patient and scheduled lab appt with DV on 11/30/22@3pm .

## 2022-11-30 ENCOUNTER — Other Ambulatory Visit: Payer: Medicare Other

## 2022-11-30 ENCOUNTER — Other Ambulatory Visit: Payer: Self-pay | Admitting: Oncology

## 2022-11-30 ENCOUNTER — Ambulatory Visit: Payer: Medicare Other | Admitting: Oncology

## 2022-11-30 NOTE — Progress Notes (Signed)
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  She is status post neoadjuvant trastuzumab/pertuzumab/paclitaxel.  She had an excellent response to this, with just a tiny residual invasive carcinoma. She received 4 cycles of adjuvant Kadcyla but this was discontinued due to toxicities. She went on to have single agent trastuzumab to complete 1 year of HER2 targeted therapy. Right diagnostic mammogram in June did not reveal any evidence of malignancy.  Benign postlumpectomy changes.  Benign, stable, biopsied right breast asymmetry.  She is scheduled for bilateral diagnostic mammogram in December and to see Dr. Lequita Halt after that. She does not have evidence of local recurrence but multiple symptoms concerning for possible metastatic disease. Her MRI scans are suspicious for recurrent breast cancer.   Right arm weakness New weakness and pain of her right arm, consistent with cervical radiculopathy. Due to her history of HER2 receptor positive breast cancer, she is at increased risk for metastatic disease. CT of thoracic spine on 11/01/2022 showed there is likely evidence of epidural extension of tumor at the T7 level with likely at least mild spinal canal narrowing/. I will therefore refer her to radiation oncology.     Word finding difficulty Difficulty with word finding, in addition to the right arm weakness. Not oriented to year. She has a prior history of left sided stroke. Also, concerning for possible metastatic disease. MRI of the brain done on 11/01/2022 shows no acute intracranial abnormality and moderate to severe chronic small vessel ischemic disease  with multiple chronic infarctions.   Upper back pain New onset upper back pain. Due to her HER2 receptor positive breast cancer. MRI of the thoracic spine done on 11/01/2022 revealed multiple T2 hyperintense lesions throughout the thoracic spine, concerning for metastatic disease, there is likely evidence of epidural extension of tumor at the T7 level with likely at least mild spinal canal narrowing, and non-specific T2 hyperintense lesion in the posterior right hepatic lobe measuring 6mm, so she has a possible live metastasis as well.   Anemia Mild anemia. On 10/27/2022 her vitamin B-12, folate, ferritin, iron, and TIBC were all normal.    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  Plan: In clinic today, I went over all of her PET scan images with her, for which she could see her multiple areas of metastatic bone disease.  I explained to the patient and her family that this likely represents metastatic breast cancer.  Unlike her initial diagnosis, she is now dealing with incurable disease.  If she chooses to receive additional therapy, it would be for prolonged disease control - not for a curative intent.  In clinic today, the patient expressed how the most significant issues impacting her daily quality of life are her right arm and back pain.  The patient clearly has metastatic bone lesions in her right shoulder and T7 spinal regions which are likely the etiologies behind her pain.  I will have radiation oncology see her as quickly as possible so they may begin palliative radiation to these areas.  After her palliative radiation is completed, the patient ultimately needs to decide whether wishes to undergo additional palliative therapy to keep her metastatic disease  under a decent semblance of control.  If she chooses to do this, I would theoretically recommend that a biopsy of 1 of these metastatic areas be done to verify that it represents  the same HER2 positive breast cancer she has dealt with previously.  From there, some form of palliative treatment plan could be established.  Our office will see this patient back in approximately 3 weeks to see how well she has tolerated her palliative radiation, as well as to discuss what she wishes to to do next for her palliative disease management.  With respect to her pain management, she claims Tramadol was too strong for her; she has been getting by with Tylenol 650 mg three times daily.  She knows to contact our office if her pain becomes more prominent to where a change needs to be made.  The patient and her family understand all the plans discussed today and are in agreement with them.  I will prescribe Tramadol 50mg  every 6 hours prn. She had a MRI of the cervical spine done on 11/01/2022 that revealed sub-centimeter T1 hypointense, stir hyperintense lesion in the C6 vertebral body suspicious for metastatic disease, multiple degenerative change throughout the cervical spine resulting in up to mild to moderate spinal canal stenosis at C3-C4 and C4-C5, and moderate right neural foraminal stenosis at C6-C7 with degenerative disc disease most advanced at C6-C7. MRI of the thoracic spine done on the same day that revealed multiple T2 hyperintense lesions throughout the thoracic spine, concerning for metastatic disease.  There is likely evidence of epidural extension of tumor at the T7 level with at least mild spinal canal narrowing, and non-specific T2 hyperintense lesion in the posterior right hepatic lobe measuring 6 mm. MRI of the head also done on 11/01/2022 revealed no acute intracranial abnormality and moderate to severe chronic small vessel ischemic disease with multiple chronic infarctions. Since her original cancer was HER2+, I informed her that this would also likely be HER2+, and there are multiple treatments that she can try even going back to Herceptin. I recommended she have an MRI with  contrast for further evaluation. I will schedule a MRI of the T-spine and LS-spine with contrast. I will also schedule a PET to complete staging since there is a suspicious lesion in the liver as well. I will schedule a consultation with Dr. Thersa Salt. She had labs done on 10/26/2022. I will schedule an echocardiogram and she may need to have a biopsy done after the PET scan. Her WBC is 5.1, hemoglobin dropped from 12.0 to 11.2, and platelet count of 172,000 and her calcium was elevated at 10.4, and total bilirubin of 1.5. On 10/27/2022 her vitamin B-12, folate, ferritin, iron, and TIBC were all normal. I will see her back in 2-3 weeks, after tests are done with CBC, CMP,  CEA, CA27.29, SPEP, PT, and PTT. The patient and her daughter understand the plans discussed today and is in agreement with them.  She knows to contact our office if she develops concerns prior to her next appointment.   I provided 23 minutes of face-to-face time during this this encounter and > 50% was spent counseling as documented under my assessment and plan.    Weston Settle, MD  [facility] [address] [name] [address] Dept: [phone] Dept Fax: [phone]   No orders of the defined types were placed in this encounter.   CHIEF COMPLAINT:  CC: Stage IB HER2  receptor positive breast cancer  Current Treatment:  Observation  HISTORY OF PRESENT ILLNESS:   Oncology History  Malignant neoplasm of upper-outer quadrant of right breast in female, estrogen receptor positive (HCC)  12/18/2019 Cancer Staging   Staging form: Breast, AJCC 8th Edition - Clinical stage from 12/18/2019: Stage IB (cT2, cN0, cM0, G3, ER+, PR+, HER2+) - Signed by [name], MD on 01/19/2020   01/08/2020 Initial Diagnosis   Breast cancer, right (HCC)   01/28/2020 - 03/26/2020 Chemotherapy    Patient is on Treatment Plan: BREAST TRASTUZUMAB + PERTUZUMAB Q21D X 11  CYCLES      06/01/2020 - 06/01/2020 Chemotherapy         06/17/2020 - 07/29/2020 Chemotherapy         09/17/2020 - 04/16/2021 Chemotherapy   Patient is on Treatment Plan : BREAST Trastuzumab q21d X 11 Cycles         INTERVAL HISTORY:  The patient comes in today to go over her PET scan images and their implications.  The patient claims to still have significant right arm pain to where her range of motion has been limited.  Her back pain has also somewhat impacted her ambulation. Understandably, her family is concerned she has metastatic disease.    Brandy Jacobs is here today for repeat clinical assessment stage IB HER2 receptor positive breast cancer. Patient states that she feels ok but complains of right arm pain and a pain behind her right breast that has persisted for several months. She informed me that her arms are weak to the point where she cannot lift them. She takes tylenol for pain relief occasionally. I will prescribe  Tramadol 50mg  Q6hrs prn. She had a MRI of the cervical spine done on 11/01/2022 that revealed sub-centimeter T1 hypointense, stir hyperintense lesion in the C6 vertebral body suspicious for metastatic disease or multiple myeloma, multiple degenerative change throughout the cervical spine resulting in up to mild to moderate spinal canal stenosis at C3-C4 and C4-C5, and moderate right neural foraminal stenosis at C6-C7 with degenerative disc disease most advanced at C6-C7. MRI of the thoracic spine done on the same day that revealed multiple T2 hyperintense lesions throughout the thoracic spine, concerning for metastatic disease, there is likely evidence of epidural extension of tumor at the T7 level with at least mild spinal canal narrowing, and non-specific T2 hyperintense lesion in the posterior right hepatic lobe measuring 6 mm. MRI of the head also done on 11/01/2022 revealed no acute intracranial abnormality and moderate to severe chronic small vessel ischemic disease with  multiple chronic infarctions. Since her original cancer was HER2+, I informed her that this would also likely be HER2+, and there are multiple treatments that she can try even going back to Herceptin. I recommended she have a MRI with contrast for further evaluation. I will schedule a MRI of the T-spine and LS-spine with contrast. I will also schedule a PET to complete staging since there is a suspicious lesion in the liver as well. I will schedule a consultation with Dr. Thersa Salt. She had labs done on 10/26/2022. I will schedule her a echocardiogram and she may need to have a biopsy done after the PET scan. Her WBC is 5.1, hemoglobin dropped from 12.0 to 11.2, and platelet count of 172,000 and her calcium was elevated at 10.4, and total bilirubin of 1.5. On 10/27/2022 her vitamin B-12, folate, ferritin, iron, and TIBC were all normal. I will see her back in 2 weeks after tests are done with  CBC, CMP, CEA, CA27.29, SPEP, PT, and PTT. She denies signs of infection such as sore throat, sinus drainage, cough, or urinary symptoms.  She denies fevers or recurrent chills. She denies pain. She denies nausea, vomiting, chest pain, dyspnea or cough. Her appetite is good and her weight has increased 7 pounds over last 7 months .   Exam: 11/01/2022 MRI of Cervical Spine without Contrast Impression: Sub-centimeter T1 hypointense, stir hyperintense lesion in the C6 vertebral body suspicious for metastatic disease or multiple myeloma.  Multiple degenerative change throughout the cervical spine resulting in up to mild to moderate spinal canal stenosis at C3-C4 and C4-C5, and moderate right neural foraminal stenosis at C6-C7 with degenerative disc disease most advanced at C6-C7.  Thoracic spine MRI reported separately.    Exam: 11/01/2022 MRI of Thoracic Spine without Contrast Impression:  multiple T2 hyperintense lesions throughout the thoracic spine, as above, concerning for metastatic disease. There is likely  evidence of epidural extension of tumor at the T7 level with likely at least mild spinal canal narrowing. Recommend contrast enhanced MRI of the thoracic spine for further evaluation.  Non-specific T2 hyperintense lesion in the posterior right hepatic lobe measuring 6mm. Recommend further evaluation with dedicated abdominal MRI with and without contrast.    Exam: 11/01/2022 MRI Head without Contrast Impression: No acute intracranial abnormality. Moderate to severe chronic small vessel ischemic disease with multiple chronic infarcts as above.   REVIEW OF SYSTEMS:  Review of Systems  Constitutional:  Positive for fatigue. Negative for appetite change, chills, diaphoresis, fever and unexpected weight change.  HENT:  Negative.  Negative for hearing loss, lump/mass, mouth sores, nosebleeds, sore throat, tinnitus, trouble swallowing and voice change.   Eyes: Negative.  Negative for eye problems and icterus.  Respiratory: Negative.  Negative for chest tightness, cough, hemoptysis, shortness of breath and wheezing.   Cardiovascular: Negative.  Negative for chest pain, leg swelling and palpitations.  Gastrointestinal: Negative.  Negative for abdominal distention, abdominal pain, blood in stool, constipation, diarrhea, nausea, rectal pain and vomiting.  Endocrine: Negative.   Genitourinary: Negative.  Negative for bladder incontinence, difficulty urinating, dyspareunia, dysuria, frequency, hematuria, menstrual problem, nocturia, pelvic pain, vaginal bleeding and vaginal discharge.   Musculoskeletal:  Positive for back pain, gait problem (uses walker) and neck pain. Negative for arthralgias, flank pain, myalgias and neck stiffness.       Right arm and shoulder pain with decreased ability to use the right upper extremity  Skin: Negative.  Negative for itching, rash and wound.  Neurological:  Positive for extremity weakness (right upper extremity) and gait problem (uses walker). Negative for dizziness,  headaches, light-headedness, numbness, seizures and speech difficulty.  Hematological: Negative.  Negative for adenopathy. Does not bruise/bleed easily.  Psychiatric/Behavioral: Negative.  Negative for confusion, decreased concentration, depression, sleep disturbance and suicidal ideas. The patient is not nervous/anxious.    Past Surgical History:  Procedure Laterality Date   BIOPSY THYROID     benign   BREAST BIOPSY     BREAST LUMPECTOMY W/ NEEDLE LOCALIZATION Right 05/04/2020     VITALS:  There were no vitals taken for this visit.  Wt Readings from Last 3 Encounters:  11/10/22 141 lb 1.6 oz (64 kg)  10/26/22 139 lb 9.6 oz (63.3 kg)  04/14/22 134 lb 11.2 oz (61.1 kg)    There is no height or weight on file to calculate BMI.  Performance status (ECOG): 2 - Symptomatic, <50% confined to bed  PHYSICAL EXAM:  Physical Exam Vitals and  nursing note reviewed.  Constitutional:      General: She is not in acute distress.    Appearance: Normal appearance. She is normal weight. She is ill-appearing (chronically ill appearing). She is not toxic-appearing or diaphoretic.  HENT:     Head: Normocephalic and atraumatic.     Right Ear: Tympanic membrane, ear canal and external ear normal. There is no impacted cerumen.     Left Ear: Tympanic membrane, ear canal and external ear normal. There is no impacted cerumen.     Nose: Nose normal. No congestion or rhinorrhea.     Mouth/Throat:     Mouth: Mucous membranes are moist.     Pharynx: Oropharynx is clear. No oropharyngeal exudate or posterior oropharyngeal erythema.  Eyes:     General: No scleral icterus.       Right eye: No discharge.        Left eye: No discharge.     Extraocular Movements: Extraocular movements intact.     Conjunctiva/sclera: Conjunctivae normal.     Pupils: Pupils are equal, round, and reactive to light.  Neck:     Vascular: No carotid bruit.  Cardiovascular:     Rate and Rhythm: Normal rate and regular rhythm.      Pulses: Normal pulses.     Heart sounds: Normal heart sounds. No murmur heard.    No friction rub. No gallop.  Pulmonary:     Effort: Pulmonary effort is normal. No respiratory distress.     Breath sounds: Normal breath sounds. No stridor. No wheezing, rhonchi or rales.  Chest:     Chest wall: No tenderness.  Breasts:    Right: Normal.     Left: Normal.  Abdominal:     General: Bowel sounds are normal. There is no distension.     Palpations: Abdomen is soft. There is no mass.     Tenderness: There is no abdominal tenderness. There is no right CVA tenderness, left CVA tenderness, guarding or rebound.     Hernia: No hernia is present.  Musculoskeletal:        General: No swelling, tenderness, deformity or signs of injury. Normal range of motion.     Cervical back: Normal range of motion and neck supple. No rigidity or tenderness.     Right lower leg: No edema.     Left lower leg: No edema.  Lymphadenopathy:     Cervical: No cervical adenopathy.     Upper Body:     Right upper body: No supraclavicular or axillary adenopathy.     Left upper body: No supraclavicular or axillary adenopathy.  Skin:    General: Skin is warm and dry.     Coloration: Skin is not jaundiced or pale.     Findings: No bruising, erythema, lesion or rash.  Neurological:     General: No focal deficit present.     Mental Status: She is alert and oriented to person, place, and time. Mental status is at baseline.     Cranial Nerves: No cranial nerve deficit.     Sensory: Sensation is intact. No sensory deficit.     Motor: Weakness (right upper extremity) present.     Coordination: Coordination normal.     Gait: Gait normal.     Deep Tendon Reflexes: Reflexes normal.     Comments: She is oriented to person, place, month, day and date, but not year. Motor strength is 3+/5 right upper extremity with 4/5 grip. She uses her left arm to  lift her right arm. She denies dropping items with the right hand. When she tries to  describe how she manages at home she cannot think of the word for end table and recliner.  Psychiatric:        Mood and Affect: Mood normal.        Behavior: Behavior normal.        Thought Content: Thought content normal.        Judgment: Judgment normal.    LABS:      Latest Ref Rng & Units 10/26/2022    1:01 PM 04/14/2022    2:26 PM 10/15/2021   12:00 AM  CBC  WBC 4.0 - 10.5 K/uL 5.1  5.1  5.2      Hemoglobin 12.0 - 15.0 g/dL 16.1  09.6  04.5      Hematocrit 36.0 - 46.0 % 36.7  38.3  37      Platelets 150 - 400 K/uL 172  179  182         This result is from an external source.      Latest Ref Rng & Units 10/26/2022    1:01 PM 04/14/2022    2:26 PM 10/15/2021   12:00 AM  CMP  Glucose 70 - 99 mg/dL 409  811    BUN 8 - 23 mg/dL 16  16  21       Creatinine 0.44 - 1.00 mg/dL 9.14  7.82  1.0      Sodium 135 - 145 mmol/L 140  136  135      Potassium 3.5 - 5.1 mmol/L 4.1  4.2  4.8      Chloride 98 - 111 mmol/L 105  103  102      CO2 22 - 32 mmol/L 27  27  30       Calcium 8.9 - 10.3 mg/dL 95.6  21.3  08.6      Total Protein 6.5 - 8.1 g/dL 7.1  7.1    Total Bilirubin 0.3 - 1.2 mg/dL 1.4  1.5    Alkaline Phos 38 - 126 U/L 77  61  75      AST 15 - 41 U/L 22  22  30       ALT 0 - 44 U/L 14  14  18          This result is from an external source.   Component Ref Range & Units 10/27/2022   Vitamin B-12 180 - 914 pg/mL 234   Component Ref Range & Units 10/27/2022  Folate >5.9 ng/mL 16.9   No results found for: "CEA1", "CEA" / No results found for: "CEA1", "CEA" No results found for: "PSA1" No results found for: "VHQ469" No results found for: "CAN125"  No results found for: "TOTALPROTELP", "ALBUMINELP", "A1GS", "A2GS", "BETS", "BETA2SER", "GAMS", "MSPIKE", "SPEI" Lab Results  Component Value Date   TIBC 298 10/27/2022   FERRITIN 96 10/27/2022   IRONPCTSAT 24 10/27/2022   No results found for: "LDH"  STUDIES:  Her PET scan on 11-23-22 revealed the following:   FINDINGS: Mediastinal blood pool activity: SUV max 2.40  Liver activity: SUV max NA  NECK: No hypermetabolic lymph nodes in the neck. Focus of hypermetabolism near the left tonsillar/tongue base lesion on the left. SUV max is 5.53. There is some soft tissue thickening in this area. Neoplasm versus infection. Recommend direct visualization.  Incidental CT findings: None.  CHEST: Small hypermetabolic focus in the right lateral breast has an SUV max of 5.86  and consistent with small focus of breast cancer. No enlarged or hypermetabolic supraclavicular or axillary lymph nodes. No mediastinal or hilar adenopathy.  Incidental CT findings: Stable advanced vascular disease. No pericardial effusion. No worrisome pulmonary nodules to suggest pulmonary metastatic disease.  ABDOMEN/PELVIS: No abnormal hypermetabolic activity within the liver, pancreas, adrenal glands, or spleen. No hypermetabolic lymph nodes in the abdomen or pelvis.  Scattered hypermetabolic foci in the colon without CT correlate. This is likely physiologic or inflammatory. Recommend correlation with colon cancer screening history.  Incidental CT findings: Diffuse colonic diverticulosis most severe in the sigmoid colon. Large calcified uterine fibroid noted. Advanced vascular disease without aneurysm. Mild cystocele noted.  SKELETON: Diffuse hypermetabolic skeletal metastasis. This involves the axial and appendicular skeleton.  Index lesions:  Right humerus has SUV max of 13.29. Clear destructive changes involving the cortex.  Anterior first right rib has SUV max of 8.91.  Left fourth posterolateral rib has an SUV max of 13.99 with advanced destructive bony changes.  T7 vertebral body has an SUV max of 11.61. Destructive bony changes on the CT scan.  Incidental CT findings: None.  IMPRESSION: 1. Small hypermetabolic focus in the right lateral breast consistent with small focus of breast cancer. 2. Diffuse  hypermetabolic skeletal metastasis involving the axial and appendicular skeleton. Associated pathologic fractures. 3. No findings for soft tissue metastatic disease. 4. Focus of hypermetabolism near the left tonsillar/tongue base lesion on the left. There is some soft tissue thickening in this area. Neoplasm versus infection. Recommend direct visualization. 5. Scattered hypermetabolic foci in the colon without CT correlate. This is likely physiologic or inflammatory. Recommend correlation with colon cancer screening history.  HISTORY:   Past Medical History:  Diagnosis Date   Anemia 10/26/2022   Arthritis    osteoarthritis   Breast cancer (HCC)    Diabetes mellitus without complication (HCC)    not on medication   History of CVA (cerebrovascular accident)    Hypertension    Multiple thyroid nodules    benign   Right arm weakness 10/26/2022   Upper back pain 10/26/2022   Word finding difficulty 10/26/2022      Family History  Problem Relation Age of Onset   Colon cancer Father        29s   Breast cancer Sister        48-60   Colon cancer Brother        11s   Prostate cancer Brother        late 35s   Breast cancer Half-Sister        late 6s early 50s    Social History:  reports that she has never smoked. She has never used smokeless tobacco. She reports that she does not currently use alcohol. She reports that she does not use drugs.The patient is accompanied by her daughter today.  Allergies: No Known Allergies  Current Medications: Current Outpatient Medications  Medication Sig Dispense Refill   acetaminophen (TYLENOL) 500 MG tablet Take 500 mg by mouth at bedtime.     aspirin EC 81 MG tablet Take 81 mg by mouth daily. Swallow whole.     atorvastatin (LIPITOR) 40 MG tablet Take 40 mg by mouth daily.     cyanocobalamin (VITAMIN B12) 500 MCG tablet Take 1 tablet (500 mcg total) by mouth daily.     losartan (COZAAR) 50 MG tablet Take 50 mg by mouth daily.      traMADol (ULTRAM) 50 MG tablet Take 1 tablet (50 mg total) by  mouth every 6 (six) hours as needed. 30 tablet 0   triamcinolone cream (KENALOG) 0.1 % SMARTSIG:1 Application Topical 2-3 Times Daily     Vitamin D3 (VITAMIN D) 25 MCG tablet Take 1,000 Units by mouth daily.     No current facility-administered medications for this visit.    I,Brandy Jacobs,acting as a scribe for [employer], MD.,have documented all relevant documentation on the behalf of Brandy Kallstrom Kirby Funk, MD,as directed by  Weston Settle, MD while in the presence of Brandy Deloatch Kirby Funk, MD.

## 2022-12-01 ENCOUNTER — Inpatient Hospital Stay: Payer: Medicare Other

## 2022-12-01 ENCOUNTER — Inpatient Hospital Stay: Payer: Medicare Other | Admitting: Oncology

## 2022-12-01 VITALS — BP 155/76 | HR 62 | Temp 98.5°F | Resp 14 | Ht 64.5 in

## 2022-12-01 DIAGNOSIS — C7951 Secondary malignant neoplasm of bone: Secondary | ICD-10-CM

## 2022-12-01 DIAGNOSIS — Z9221 Personal history of antineoplastic chemotherapy: Secondary | ICD-10-CM | POA: Insufficient documentation

## 2022-12-01 DIAGNOSIS — Z8673 Personal history of transient ischemic attack (TIA), and cerebral infarction without residual deficits: Secondary | ICD-10-CM | POA: Insufficient documentation

## 2022-12-01 DIAGNOSIS — Z79899 Other long term (current) drug therapy: Secondary | ICD-10-CM | POA: Insufficient documentation

## 2022-12-01 DIAGNOSIS — M546 Pain in thoracic spine: Secondary | ICD-10-CM | POA: Insufficient documentation

## 2022-12-01 DIAGNOSIS — R531 Weakness: Secondary | ICD-10-CM | POA: Insufficient documentation

## 2022-12-01 DIAGNOSIS — D649 Anemia, unspecified: Secondary | ICD-10-CM | POA: Insufficient documentation

## 2022-12-01 DIAGNOSIS — Z510 Encounter for antineoplastic radiation therapy: Secondary | ICD-10-CM | POA: Diagnosis present

## 2022-12-01 DIAGNOSIS — Z170 Estrogen receptor positive status [ER+]: Secondary | ICD-10-CM | POA: Insufficient documentation

## 2022-12-01 DIAGNOSIS — Z7982 Long term (current) use of aspirin: Secondary | ICD-10-CM | POA: Insufficient documentation

## 2022-12-01 DIAGNOSIS — M79601 Pain in right arm: Secondary | ICD-10-CM | POA: Insufficient documentation

## 2022-12-01 DIAGNOSIS — C50411 Malignant neoplasm of upper-outer quadrant of right female breast: Secondary | ICD-10-CM | POA: Insufficient documentation

## 2022-12-01 LAB — CBC WITH DIFFERENTIAL (CANCER CENTER ONLY)
Abs Immature Granulocytes: 0.04 10*3/uL (ref 0.00–0.07)
Basophils Absolute: 0.1 10*3/uL (ref 0.0–0.1)
Basophils Relative: 1 %
Eosinophils Absolute: 0.1 10*3/uL (ref 0.0–0.5)
Eosinophils Relative: 3 %
HCT: 34.8 % — ABNORMAL LOW (ref 36.0–46.0)
Hemoglobin: 11.4 g/dL — ABNORMAL LOW (ref 12.0–15.0)
Immature Granulocytes: 1 %
Lymphocytes Relative: 26 %
Lymphs Abs: 1.3 10*3/uL (ref 0.7–4.0)
MCH: 31.1 pg (ref 26.0–34.0)
MCHC: 32.8 g/dL (ref 30.0–36.0)
MCV: 94.8 fL (ref 80.0–100.0)
Monocytes Absolute: 0.4 10*3/uL (ref 0.1–1.0)
Monocytes Relative: 7 %
Neutro Abs: 3.1 10*3/uL (ref 1.7–7.7)
Neutrophils Relative %: 62 %
Platelet Count: 195 10*3/uL (ref 150–400)
RBC: 3.67 MIL/uL — ABNORMAL LOW (ref 3.87–5.11)
RDW: 14.1 % (ref 11.5–15.5)
WBC Count: 5 10*3/uL (ref 4.0–10.5)
nRBC: 0 % (ref 0.0–0.2)
nRBC: 0 /100{WBCs}

## 2022-12-01 LAB — CMP (CANCER CENTER ONLY)
ALT: 6 U/L (ref 0–44)
AST: 22 U/L (ref 15–41)
Albumin: 4.1 g/dL (ref 3.5–5.0)
Alkaline Phosphatase: 107 U/L (ref 38–126)
Anion gap: 9 (ref 5–15)
BUN: 15 mg/dL (ref 8–23)
CO2: 28 mmol/L (ref 22–32)
Calcium: 10.3 mg/dL (ref 8.9–10.3)
Chloride: 106 mmol/L (ref 98–111)
Creatinine: 0.8 mg/dL (ref 0.44–1.00)
GFR, Estimated: >60 mL/min (ref >60)
Glucose, Bld: 92 mg/dL (ref 70–99)
Potassium: 3.8 mmol/L (ref 3.5–5.1)
Sodium: 143 mmol/L (ref 135–145)
Total Bilirubin: 1.3 mg/dL — ABNORMAL HIGH (ref <1.2)
Total Protein: 6.7 g/dL (ref 6.5–8.1)

## 2022-12-01 LAB — PROTIME-INR
INR: 1 (ref 0.8–1.2)
Prothrombin Time: 13.4 s (ref 11.4–15.2)

## 2022-12-01 LAB — APTT: aPTT: 28 s (ref 24–36)

## 2022-12-01 LAB — CEA (ACCESS): CEA (CHCC): 12.09 ng/mL — ABNORMAL HIGH (ref 0.00–5.00)

## 2022-12-02 ENCOUNTER — Encounter: Payer: Self-pay | Admitting: Oncology

## 2022-12-02 ENCOUNTER — Other Ambulatory Visit: Payer: Self-pay | Admitting: Oncology

## 2022-12-02 ENCOUNTER — Institutional Professional Consult (permissible substitution): Payer: Medicare Other | Admitting: Radiation Oncology

## 2022-12-02 DIAGNOSIS — C50411 Malignant neoplasm of upper-outer quadrant of right female breast: Secondary | ICD-10-CM

## 2022-12-02 DIAGNOSIS — C7951 Secondary malignant neoplasm of bone: Secondary | ICD-10-CM

## 2022-12-02 LAB — CANCER ANTIGEN 27.29: CA 27.29: 111.7 U/mL — ABNORMAL HIGH (ref 0.0–38.6)

## 2022-12-05 ENCOUNTER — Encounter: Payer: Self-pay | Admitting: Hematology and Oncology

## 2022-12-05 LAB — PROTEIN ELECTROPHORESIS, SERUM
A/G Ratio: 1.5 (ref 0.7–1.7)
Albumin ELP: 3.9 g/dL (ref 2.9–4.4)
Alpha-1-Globulin: 0.3 g/dL (ref 0.0–0.4)
Alpha-2-Globulin: 0.7 g/dL (ref 0.4–1.0)
Beta Globulin: 0.9 g/dL (ref 0.7–1.3)
Gamma Globulin: 0.8 g/dL (ref 0.4–1.8)
Globulin, Total: 2.6 g/dL (ref 2.2–3.9)
Total Protein ELP: 6.5 g/dL (ref 6.0–8.5)

## 2022-12-07 ENCOUNTER — Ambulatory Visit
Admission: RE | Admit: 2022-12-07 | Discharge: 2022-12-07 | Disposition: A | Discharge status: Home / Self Care | Payer: Medicare Other | Source: Ambulatory Visit | Attending: Radiation Oncology | Admitting: Radiation Oncology

## 2022-12-07 ENCOUNTER — Inpatient Hospital Stay: Payer: Medicare Other

## 2022-12-07 DIAGNOSIS — Z510 Encounter for antineoplastic radiation therapy: Secondary | ICD-10-CM | POA: Insufficient documentation

## 2022-12-07 DIAGNOSIS — Z170 Estrogen receptor positive status [ER+]: Secondary | ICD-10-CM | POA: Insufficient documentation

## 2022-12-07 DIAGNOSIS — C50411 Malignant neoplasm of upper-outer quadrant of right female breast: Secondary | ICD-10-CM | POA: Insufficient documentation

## 2022-12-07 NOTE — Progress Notes (Unsigned)
[facility] Clinical Social Work  Clinical Social Work was referred by nurse for assessment of psychosocial needs.  Clinical Social Worker met with caregiver to offer support and assess for needs.  Patient's caregiver requested assistance with navigating medicaid (submitted application with patient's insurance agent) and assistance around the home. CSW sent email requesting update to contact listed on DSS website, patient provided direct number. CSW and patient discussed home health available to [facility] Medicare vs Medicaid and needs, CSW and patient discussed  pyschosocial needs of caregiver.   Follow up scheduled  for 11/18   Marguerita Merles, [place] Clinical Social Worker [facility]

## 2022-12-08 ENCOUNTER — Encounter: Payer: Self-pay | Admitting: Oncology

## 2022-12-09 DIAGNOSIS — Z510 Encounter for antineoplastic radiation therapy: Secondary | ICD-10-CM | POA: Diagnosis not present

## 2022-12-12 ENCOUNTER — Ambulatory Visit
Admission: RE | Admit: 2022-12-12 | Discharge: 2022-12-12 | Disposition: A | Discharge status: Home / Self Care | Payer: Medicare Other | Source: Ambulatory Visit | Attending: Radiation Oncology | Admitting: Radiation Oncology

## 2022-12-12 ENCOUNTER — Other Ambulatory Visit: Payer: Self-pay

## 2022-12-12 DIAGNOSIS — Z510 Encounter for antineoplastic radiation therapy: Secondary | ICD-10-CM | POA: Diagnosis not present

## 2022-12-12 LAB — RAD ONC ARIA SESSION SUMMARY

## 2022-12-13 ENCOUNTER — Encounter: Payer: Self-pay | Admitting: Oncology

## 2022-12-13 ENCOUNTER — Ambulatory Visit
Admission: RE | Admit: 2022-12-13 | Discharge: 2022-12-13 | Disposition: A | Discharge status: Home / Self Care | Payer: Medicare Other | Source: Ambulatory Visit | Attending: Radiation Oncology

## 2022-12-13 ENCOUNTER — Ambulatory Visit
Admission: RE | Admit: 2022-12-13 | Discharge: 2022-12-13 | Disposition: A | Discharge status: Home / Self Care | Payer: Medicare Other | Source: Ambulatory Visit | Attending: Radiation Oncology | Admitting: Radiation Oncology

## 2022-12-13 ENCOUNTER — Other Ambulatory Visit: Payer: Self-pay

## 2022-12-13 DIAGNOSIS — Z510 Encounter for antineoplastic radiation therapy: Secondary | ICD-10-CM | POA: Diagnosis not present

## 2022-12-13 LAB — RAD ONC ARIA SESSION SUMMARY

## 2022-12-14 ENCOUNTER — Ambulatory Visit
Admission: RE | Admit: 2022-12-14 | Discharge: 2022-12-14 | Disposition: A | Discharge status: Home / Self Care | Payer: Medicare Other | Source: Ambulatory Visit | Attending: Radiation Oncology

## 2022-12-14 ENCOUNTER — Telehealth: Payer: Self-pay

## 2022-12-14 ENCOUNTER — Other Ambulatory Visit: Payer: Self-pay

## 2022-12-14 DIAGNOSIS — Z510 Encounter for antineoplastic radiation therapy: Secondary | ICD-10-CM | POA: Diagnosis not present

## 2022-12-14 LAB — RAD ONC ARIA SESSION SUMMARY

## 2022-12-14 NOTE — Telephone Encounter (Signed)
[facility] CSW Progress Note  Clinical Social Worker  attempted to reach patient's daughter C.Leyba  to follow up on medicaid status and need for assistance in the home. CSW unable to leave vm,     Marguerita Merles, [employer] Clinical Social Worker [facility]

## 2022-12-15 ENCOUNTER — Other Ambulatory Visit: Payer: Self-pay

## 2022-12-15 ENCOUNTER — Ambulatory Visit
Admission: RE | Admit: 2022-12-15 | Discharge: 2022-12-15 | Disposition: A | Discharge status: Home / Self Care | Payer: Medicare Other | Source: Ambulatory Visit | Attending: Radiation Oncology | Admitting: Radiation Oncology

## 2022-12-15 DIAGNOSIS — Z510 Encounter for antineoplastic radiation therapy: Secondary | ICD-10-CM | POA: Diagnosis not present

## 2022-12-15 LAB — RAD ONC ARIA SESSION SUMMARY

## 2022-12-16 ENCOUNTER — Other Ambulatory Visit: Payer: Self-pay

## 2022-12-16 DIAGNOSIS — Z510 Encounter for antineoplastic radiation therapy: Secondary | ICD-10-CM | POA: Diagnosis not present

## 2022-12-16 LAB — RAD ONC ARIA SESSION SUMMARY

## 2022-12-18 ENCOUNTER — Ambulatory Visit
Admission: RE | Admit: 2022-12-18 | Discharge: 2022-12-18 | Disposition: A | Discharge status: Home / Self Care | Payer: Medicare Other | Source: Ambulatory Visit | Attending: Radiation Oncology

## 2022-12-18 DIAGNOSIS — Z510 Encounter for antineoplastic radiation therapy: Secondary | ICD-10-CM | POA: Diagnosis not present

## 2022-12-19 ENCOUNTER — Ambulatory Visit: Payer: Medicare Other

## 2022-12-20 ENCOUNTER — Ambulatory Visit: Payer: Medicare Other | Admitting: Radiation Oncology

## 2022-12-20 ENCOUNTER — Ambulatory Visit: Payer: Medicare Other

## 2022-12-21 ENCOUNTER — Inpatient Hospital Stay: Payer: Medicare Other | Admitting: Oncology

## 2022-12-21 ENCOUNTER — Inpatient Hospital Stay: Payer: Medicare Other

## 2022-12-21 ENCOUNTER — Ambulatory Visit
Admission: RE | Admit: 2022-12-21 | Discharge: 2022-12-21 | Disposition: A | Discharge status: Home / Self Care | Payer: Medicare Other | Source: Ambulatory Visit | Attending: Radiation Oncology

## 2022-12-21 ENCOUNTER — Encounter: Payer: Self-pay | Admitting: Oncology

## 2022-12-21 ENCOUNTER — Telehealth: Payer: Self-pay

## 2022-12-21 ENCOUNTER — Other Ambulatory Visit: Payer: Self-pay

## 2022-12-21 ENCOUNTER — Ambulatory Visit
Admission: RE | Admit: 2022-12-21 | Discharge: 2022-12-21 | Disposition: A | Discharge status: Home / Self Care | Payer: Medicare Other | Source: Ambulatory Visit | Attending: Radiation Oncology | Admitting: Radiation Oncology

## 2022-12-21 VITALS — BP 149/71 | HR 62 | Temp 98.0°F | Resp 16 | Ht 64.5 in | Wt 140.4 lb

## 2022-12-21 DIAGNOSIS — C50411 Malignant neoplasm of upper-outer quadrant of right female breast: Secondary | ICD-10-CM

## 2022-12-21 DIAGNOSIS — M549 Dorsalgia, unspecified: Secondary | ICD-10-CM | POA: Diagnosis not present

## 2022-12-21 DIAGNOSIS — Z170 Estrogen receptor positive status [ER+]: Secondary | ICD-10-CM

## 2022-12-21 DIAGNOSIS — R29898 Other symptoms and signs involving the musculoskeletal system: Secondary | ICD-10-CM | POA: Diagnosis not present

## 2022-12-21 DIAGNOSIS — G893 Neoplasm related pain (acute) (chronic): Secondary | ICD-10-CM

## 2022-12-21 DIAGNOSIS — C7951 Secondary malignant neoplasm of bone: Secondary | ICD-10-CM | POA: Diagnosis not present

## 2022-12-21 DIAGNOSIS — Z510 Encounter for antineoplastic radiation therapy: Secondary | ICD-10-CM | POA: Diagnosis not present

## 2022-12-21 DIAGNOSIS — T451X5A Adverse effect of antineoplastic and immunosuppressive drugs, initial encounter: Secondary | ICD-10-CM

## 2022-12-21 DIAGNOSIS — G620 Drug-induced polyneuropathy: Secondary | ICD-10-CM

## 2022-12-21 LAB — CMP (CANCER CENTER ONLY)
ALT: 8 U/L (ref 0–44)
AST: 24 U/L (ref 15–41)
Albumin: 4 g/dL (ref 3.5–5.0)
Alkaline Phosphatase: 111 U/L (ref 38–126)
Anion gap: 9 (ref 5–15)
BUN: 14 mg/dL (ref 8–23)
CO2: 28 mmol/L (ref 22–32)
Calcium: 10.8 mg/dL — ABNORMAL HIGH (ref 8.9–10.3)
Chloride: 103 mmol/L (ref 98–111)
Creatinine: 0.77 mg/dL (ref 0.44–1.00)
GFR, Estimated: >60 mL/min (ref >60)
Glucose, Bld: 93 mg/dL (ref 70–99)
Potassium: 4.3 mmol/L (ref 3.5–5.1)
Sodium: 139 mmol/L (ref 135–145)
Total Bilirubin: 1.5 mg/dL — ABNORMAL HIGH (ref <1.2)
Total Protein: 6.8 g/dL (ref 6.5–8.1)

## 2022-12-21 LAB — RAD ONC ARIA SESSION SUMMARY
Course Elapsed Days: 9
Plan Fractions Treated to Date: 7
Plan Fractions Treated to Date: 7
Plan Prescribed Dose Per Fraction: 3 Gy
Plan Prescribed Dose Per Fraction: 3 Gy
Plan Total Fractions Prescribed: 10
Plan Total Fractions Prescribed: 10
Plan Total Prescribed Dose: 30 Gy
Plan Total Prescribed Dose: 30 Gy
Reference Point Dosage Given to Date: 21 Gy
Reference Point Dosage Given to Date: 21 Gy
Reference Point Session Dosage Given: 3 Gy
Reference Point Session Dosage Given: 4.578 Gy
Session Number: 6

## 2022-12-21 LAB — CBC WITH DIFFERENTIAL (CANCER CENTER ONLY)
Abs Immature Granulocytes: 0.08 10*3/uL — ABNORMAL HIGH (ref 0.00–0.07)
Basophils Absolute: 0.1 10*3/uL (ref 0.0–0.1)
Basophils Relative: 1 %
Eosinophils Absolute: 0.1 10*3/uL (ref 0.0–0.5)
Eosinophils Relative: 2 %
HCT: 34.5 % — ABNORMAL LOW (ref 36.0–46.0)
Hemoglobin: 11 g/dL — ABNORMAL LOW (ref 12.0–15.0)
Immature Granulocytes: 1 %
Lymphocytes Relative: 16 %
Lymphs Abs: 0.9 10*3/uL (ref 0.7–4.0)
MCH: 31 pg (ref 26.0–34.0)
MCHC: 31.9 g/dL (ref 30.0–36.0)
MCV: 97.2 fL (ref 80.0–100.0)
Monocytes Absolute: 0.6 10*3/uL (ref 0.1–1.0)
Monocytes Relative: 10 %
Neutro Abs: 3.9 10*3/uL (ref 1.7–7.7)
Neutrophils Relative %: 70 %
Platelet Count: 178 10*3/uL (ref 150–400)
RBC: 3.55 MIL/uL — ABNORMAL LOW (ref 3.87–5.11)
RDW: 14.4 % (ref 11.5–15.5)
WBC Count: 5.6 10*3/uL (ref 4.0–10.5)
nRBC: 0 % (ref 0.0–0.2)
nRBC: 0 /100{WBCs}

## 2022-12-21 MED ORDER — MORPHINE SULFATE 20 MG/5ML PO SOLN: 5.0000 mg | ORAL | 0 refills | Status: DC | PRN

## 2022-12-21 MED ORDER — ONDANSETRON 4 MG PO TBDP: 4.0000 mg | ORAL_TABLET | Freq: Four times a day (QID) | ORAL | 3 refills | Status: DC | PRN

## 2022-12-21 NOTE — Patient Instructions (Signed)
I have written a prescription for Ondansetron 4 mg which you may take every 6 hours for nausea I have also written a prescription for liquid morphine which  you may take every 4 hours for pain.  It may take the pharmacy a few days to get this medicine.  While you are waiting for it you may continue to use the tramadol.

## 2022-12-21 NOTE — Telephone Encounter (Signed)
-----   Message from Loni Muse sent at 12/21/2022  3:32 PM EST ----- Which pharmacy is best able to obtain it ----- Message ----- From: Arville Care, CMA Sent: 12/21/2022   2:41 PM EST To: Loni Muse, MD  [facility] to say that they do not have the Morphine that you sent in. They have the 20 mg/83mL. If you want her to do the 20 mg/65mL this will need to go to another pharmacy. If you are okay with the 20 mg/ 1 ml they will need a new script.

## 2022-12-21 NOTE — Progress Notes (Signed)
[facility] CSW Progress Note  Visual merchandiser met with patient and caregiver to review CAP Program Form given to patients [employer] social research officer.    Marguerita Merles, LCSW Clinical Social Worker [facility]

## 2022-12-21 NOTE — Progress Notes (Signed)
[facility] Cancer Initial Visit:  Patient Care Team: [name], MD as PCP - General (General Practice) [name], MD as Consulting Physician (Oncology)  CHIEF COMPLAINTS/PURPOSE OF CONSULTATION:  Oncology History  Malignant neoplasm of upper-outer quadrant of right breast in female, estrogen receptor positive (HCC)  12/18/2019 Cancer Staging   Staging form: Breast, AJCC 8th Edition - Clinical stage from 12/18/2019: Stage IB (cT2, cN0, cM0, G3, ER+, PR+, HER2+) - Signed by [name], MD on 01/19/2020   01/08/2020 Initial Diagnosis   Breast cancer, right (HCC)   01/28/2020 - 03/26/2020 Chemotherapy    Patient is on Treatment Plan: BREAST TRASTUZUMAB + PERTUZUMAB Q21D X 11 CYCLES      06/01/2020 - 06/01/2020 Chemotherapy         06/17/2020 - 07/29/2020 Chemotherapy         09/17/2020 - 04/16/2021 Chemotherapy   Patient is on Treatment Plan : BREAST Trastuzumab q21d X 11 Cycles       HISTORY OF PRESENTING ILLNESS: Brandy Jacobs 89 y.o. female is here because of metastatic breast cancer  November 2021:  Diagnosed with Stage IB hormone and HER2 receptor positive breast cancer.  Received neoadjuvant trastuzumab/pertuzumab/paclitaxel with excellent response.  Received 4 cycles of adjuvant Kadcyla but this was discontinued due to toxicities. She went on to have single agent trastuzumab to complete 1 year of HER2 targeted therapy in March 2023  November 01 2022- Presents with word finding difficulties, new onset back pain and weakness of right arm. MRI head - No acute intracranial abnormality. Moderate to severe chronic small vessel ischemic disease with multiple chronic infarcts.  MRI CT spine  Subcentimeter T1 hypointense, stir hyperintense lesion in C6 vertebral body suspicious for metastatic disease.  Multilevel degenerative change throughout the cervical spine resulting spinal canal stenosis at C3-C4, C4-C5, right neural foraminal stenosis at C6-C7.    Disc degeneration most advanced at C6-C7.  Multiple T2 hyperintense lesions throughout the thoracic spine, concerning for metastatic disease. Likely epidural extension of tumor at T7 with  at least mild spinal canal narrowing.   November 14 2022:  Radiation Oncology Consult- Awaiting results of PET/CT  November 23 2022:  PET/CT- Small hypermetabolic focus in the right lateral breast consistent with small focus of breast cancer.  Diffuse hypermetabolic skeletal metastasis involving the axial and appendicular skeleton. Associated pathologic fractures.  No soft tissue metastatic disease.   Cardiac ECHO- LVEF 50-55%  November 28 2022:  MRI T/L spine-  Extensive osseous metastases seen throughout the T/L spine that enhances following contrast.  Degenerative changes.   December 01 2022: WBC 5.0 hemoglobin 11.4 platelet count 195; 62 segs 26 lymphs 7 monos 3 eos 1 basophil.  INR 1.0 PTT 28 SPEP with IP showed no paraprotein CA 27-29 112.  CEA 12.1 CMP notable for T. bili 1.3  December 07, 2022: CT simulation for treatment of thoracic spine and right humerus  December 21 2022:  Scheduled follow up for metastatic breast cancer.  Received Fraction 7/10 of XRT to T spine and right shoulder.  Here with daughter.  Can't lift right arm at shoulder.  Having pain in right arm and thoracic spine.  Not taking Tramadol because of nausea but takes ibuprofen.  Patient is staying by herself and does not have help in the home.  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  The neuropathy is not painful.  Patient and daughter have talked to our Child psychotherapist.    Review  of Systems - Oncology  MEDICAL HISTORY: Past Medical History:  Diagnosis Date  . Anemia 10/26/2022  . Arthritis    osteoarthritis  . Breast cancer (HCC)   . Diabetes mellitus without complication (HCC)    not on medication  . History of CVA (cerebrovascular accident)   . Hypertension   . Multiple thyroid nodules    benign  . Right arm  weakness 10/26/2022  . Upper back pain 10/26/2022  . Word finding difficulty 10/26/2022    SURGICAL HISTORY: Past Surgical History:  Procedure Laterality Date  . BIOPSY THYROID     benign  . BREAST BIOPSY    . BREAST LUMPECTOMY W/ NEEDLE LOCALIZATION Right 05/04/2020    SOCIAL HISTORY: Social History   Socioeconomic History  . Marital status: Widowed    Spouse name: Not on file  . Number of children: 2  . Years of education: Not on file  . Highest education level: Not on file  Occupational History  . Not on file  Tobacco Use  . Smoking status: Never  . Smokeless tobacco: Never  Substance and Sexual Activity  . Alcohol use: Not Currently  . Drug use: Never  . Sexual activity: Not Currently  Other Topics Concern  . Not on file  Social History Narrative  . Not on file   Social Determinants of Health   Financial Resource Strain: Not on file  Food Insecurity: Not on file  Transportation Needs: Not on file  Physical Activity: Not on file  Stress: Not on file  Social Connections: Not on file  Intimate Partner Violence: Not on file    FAMILY HISTORY Family History  Problem Relation Age of Onset  . Colon cancer Father        33s  . Breast cancer Sister        54-60  . Colon cancer Brother        65s  . Prostate cancer Brother        late 57s  . Breast cancer Half-Sister        late 30s early 18s    ALLERGIES:  has No Known Allergies.  MEDICATIONS:  Current Outpatient Medications  Medication Sig Dispense Refill  . morphine 20 MG/5ML solution Take 1.3 mLs (5.2 mg total) by mouth every 4 (four) hours as needed for pain. 100 mL 0  . ondansetron (ZOFRAN-ODT) 4 MG disintegrating tablet Take 1 tablet (4 mg total) by mouth every 6 (six) hours as needed for nausea or vomiting. 60 tablet 3  . acetaminophen (TYLENOL) 500 MG tablet Take 500 mg by mouth at bedtime.    Marland Kitchen aspirin EC 81 MG tablet Take 81 mg by mouth daily. Swallow whole.    Marland Kitchen atorvastatin (LIPITOR) 40  MG tablet Take 40 mg by mouth daily.    . cyanocobalamin (VITAMIN B12) 500 MCG tablet Take 1 tablet (500 mcg total) by mouth daily.    Marland Kitchen losartan (COZAAR) 50 MG tablet Take 50 mg by mouth daily.    . traMADol (ULTRAM) 50 MG tablet Take 1 tablet (50 mg total) by mouth every 6 (six) hours as needed. 30 tablet 0  . triamcinolone cream (KENALOG) 0.1 % SMARTSIG:1 Application Topical 2-3 Times Daily    . Vitamin D3 (VITAMIN D) 25 MCG tablet Take 1,000 Units by mouth daily.     No current facility-administered medications for this visit.    PHYSICAL EXAMINATION:  ECOG PERFORMANCE STATUS: 2 - Symptomatic, <50% confined to bed   Vitals:  12/21/22 1344  BP: (!) 149/71  Pulse: 62  Resp: 16  Temp: 98 F (36.7 C)  SpO2: 99%    Filed Weights   12/21/22 1344  Weight: 140 lb 6.4 oz (63.7 kg)     Physical Exam Vitals and nursing note reviewed.  Constitutional:      Appearance: Normal appearance. She is ill-appearing. She is not toxic-appearing or diaphoretic.     Comments: Frail.  Elderly seated in wheelchair  HENT:     Head: Normocephalic and atraumatic.     Right Ear: External ear normal.     Left Ear: External ear normal.     Nose: Nose normal. No congestion or rhinorrhea.  Eyes:     General: No scleral icterus.    Extraocular Movements: Extraocular movements intact.     Conjunctiva/sclera: Conjunctivae normal.     Pupils: Pupils are equal, round, and reactive to light.  Cardiovascular:     Rate and Rhythm: Normal rate and regular rhythm.     Heart sounds: Normal heart sounds. No murmur heard.    No friction rub. No gallop.  Pulmonary:     Effort: Pulmonary effort is normal. No respiratory distress.     Breath sounds: Normal breath sounds.  Abdominal:     General: Bowel sounds are normal.     Palpations: Abdomen is soft.     Tenderness: There is no abdominal tenderness. There is no guarding or rebound.  Musculoskeletal:        General: No swelling, tenderness or  deformity.     Cervical back: Normal range of motion and neck supple. No rigidity or tenderness.     Right lower leg: No edema.     Left lower leg: No edema.     Comments: Decreased muscle mass  Lymphadenopathy:     Head:     Right side of head: No submental, submandibular, tonsillar, preauricular, posterior auricular or occipital adenopathy.     Left side of head: No submental, submandibular, tonsillar, preauricular, posterior auricular or occipital adenopathy.     Cervical: No cervical adenopathy.     Right cervical: No superficial, deep or posterior cervical adenopathy.    Left cervical: No superficial, deep or posterior cervical adenopathy.     Upper Body:     Right upper body: No supraclavicular, axillary, pectoral or epitrochlear adenopathy.     Left upper body: No supraclavicular, axillary, pectoral or epitrochlear adenopathy.  Skin:    General: Skin is warm and dry.     Coloration: Skin is not jaundiced.  Neurological:     General: No focal deficit present.     Mental Status: She is alert and oriented to person, place, and time.     Cranial Nerves: No cranial nerve deficit.  Psychiatric:        Mood and Affect: Mood normal.        Behavior: Behavior normal.        Thought Content: Thought content normal.        Judgment: Judgment normal.    LABORATORY DATA: I have personally reviewed the data as listed:  Appointment on 12/21/2022  Component Date Value Ref Range Status  . Sodium 12/21/2022 139  135 - 145 mmol/L Final  . Potassium 12/21/2022 4.3  3.5 - 5.1 mmol/L Final  . Chloride 12/21/2022 103  98 - 111 mmol/L Final  . CO2 12/21/2022 28  22 - 32 mmol/L Final  . Glucose, Bld 12/21/2022 93  70 - 99 mg/dL Final  Glucose reference range applies only to samples taken after fasting for at least 8 hours.  . BUN 12/21/2022 14  8 - 23 mg/dL Final  . Creatinine 40/98/1191 0.77  0.44 - 1.00 mg/dL Final  . Calcium 47/82/9562 10.8 (H)  8.9 - 10.3 mg/dL Final  . Total Protein  12/21/2022 6.8  6.5 - 8.1 g/dL Final  . Albumin 13/08/6576 4.0  3.5 - 5.0 g/dL Final  . AST 46/96/2952 24  15 - 41 U/L Final  . ALT 12/21/2022 8  0 - 44 U/L Final  . Alkaline Phosphatase 12/21/2022 111  38 - 126 U/L Final  . Total Bilirubin 12/21/2022 1.5 (H)  <1.2 mg/dL Final  . GFR, Estimated 12/21/2022 >60  >60 mL/min Final   Comment: (NOTE) Calculated using the CKD-EPI Creatinine Equation (2021)   . Anion gap 12/21/2022 9  5 - 15 Final   Performed at [facility] at [facility], [address] [name], [name], [address]  . WBC Count 12/21/2022 5.6  4.0 - 10.5 K/uL Final  . RBC 12/21/2022 3.55 (L)  3.87 - 5.11 MIL/uL Final  . Hemoglobin 12/21/2022 11.0 (L)  12.0 - 15.0 g/dL Final  . HCT 44/01/270 34.5 (L)  36.0 - 46.0 % Final  . MCV 12/21/2022 97.2  80.0 - 100.0 fL Final  . MCH 12/21/2022 31.0  26.0 - 34.0 pg Final  . MCHC 12/21/2022 31.9  30.0 - 36.0 g/dL Final  . RDW 53/66/4403 14.4  11.5 - 15.5 % Final  . Platelet Count 12/21/2022 178  150 - 400 K/uL Final  . nRBC 12/21/2022 0.00  0.0 - 0.2 % Final  . Neutrophils Relative % 12/21/2022 70  % Final  . Neutro Abs 12/21/2022 3.9  1.7 - 7.7 K/uL Final  . Lymphocytes Relative 12/21/2022 16  % Final  . Lymphs Abs 12/21/2022 0.9  0.7 - 4.0 K/uL Final  . Monocytes Relative 12/21/2022 10  % Final  . Monocytes Absolute 12/21/2022 0.6  0.1 - 1.0 K/uL Final  . Eosinophils Relative 12/21/2022 2  % Final  . Eosinophils Absolute 12/21/2022 0.1  0.0 - 0.5 K/uL Final  . Basophils Relative 12/21/2022 1  % Final  . Basophils Absolute 12/21/2022 0.1  0.0 - 0.1 K/uL Final  . Immature Granulocytes 12/21/2022 1  % Final  . Abs Immature Granulocytes 12/21/2022 0.08 (H)  0.00 - 0.07 K/uL Final  . nRBC 12/21/2022 0  0 /100 WBC Final   Performed at [facility] at [facility], [address], [place], [name], [address]  Orders Only on 12/21/2022  Component Date Value Ref Range Status  . Course ID 12/21/2022 [id number]    Final  . Course Start Date 12/21/2022 12/08/2022   Final  . Session Number 12/21/2022 6   Final  . Course First Treatment Date 12/21/2022 12/12/2022  1:32 PM   Final  . Course Last Treatment Date 12/21/2022 12/21/2022  1:03 PM   Final  . Course Elapsed Days 12/21/2022 9   Final  . Reference Point ID 12/21/2022 Humerus_R DP   Final  . Reference Point Dosage Given to Da* 12/21/2022 21  Gy Final  . Reference Point Session Dosage Giv* 12/21/2022 3  Gy Final  . Reference Point ID 12/21/2022 [email or web address] DP   Final  . Reference Point Dosage Given to Da* 12/21/2022 21  Gy Final  . Reference Point Session Dosage Giv* 12/21/2022 4.578  Gy Final  . Plan ID 12/21/2022 Ext_Humerus_R   Final  .  Plan Name 12/21/2022 Rt Arm   Final  . Plan Fractions Treated to Date 12/21/2022 7   Final  . Plan Total Fractions Prescribed 12/21/2022 10   Final  . Plan Prescribed Dose Per Fraction 12/21/2022 3  Gy Final  . Plan Total Prescribed Dose 12/21/2022 30.000000  Gy Final  . Plan Primary Reference Point 12/21/2022 Humerus_R DP   Final  . Plan ID 12/21/2022 Spine_[email or web address]   Final  . Plan Fractions Treated to Date 12/21/2022 7   Final  . Plan Total Fractions Prescribed 12/21/2022 10   Final  . Plan Prescribed Dose Per Fraction 12/21/2022 3  Gy Final  . Plan Total Prescribed Dose 12/21/2022 30.000000  Gy Final  . Plan Primary Reference Point 12/21/2022 Spine_T6-7 DP   Final  Orders Only on 12/16/2022  Component Date Value Ref Range Status  . Course ID 12/16/2022 [id number]   Final  . Course Start Date 12/16/2022 12/08/2022   Final  . Session Number 12/16/2022 5   Final  . Course First Treatment Date 12/16/2022 12/12/2022  1:32 PM   Final  . Course Last Treatment Date 12/16/2022 12/16/2022  1:37 PM   Final  . Course Elapsed Days 12/16/2022 4   Final  . Reference Point ID 12/16/2022 Humerus_R DP   Final  . Reference Point Dosage Given to Da* 12/16/2022 15  Gy Final  . Reference Point Session Dosage Giv* 12/16/2022 3  Gy  Final  . Reference Point ID 12/16/2022 [email or web address] DP   Final  . Reference Point Dosage Given to Da* 12/16/2022 15  Gy Final  . Reference Point Session Dosage Giv* 12/16/2022 3  Gy Final  . Plan ID 12/16/2022 Ext_Humerus_R   Final  . Plan Name 12/16/2022 Rt Arm   Final  . Plan Fractions Treated to Date 12/16/2022 5   Final  . Plan Total Fractions Prescribed 12/16/2022 10   Final  . Plan Prescribed Dose Per Fraction 12/16/2022 3  Gy Final  . Plan Total Prescribed Dose 12/16/2022 30.000000  Gy Final  . Plan Primary Reference Point 12/16/2022 Humerus_R DP   Final  . Plan ID 12/16/2022 Spine_T6-7   Final  . Plan Fractions Treated to Date 12/16/2022 5   Final  . Plan Total Fractions Prescribed 12/16/2022 10   Final  . Plan Prescribed Dose Per Fraction 12/16/2022 3  Gy Final  . Plan Total Prescribed Dose 12/16/2022 30.000000  Gy Final  . Plan Primary Reference Point 12/16/2022 Spine_T6-7 DP   Final  Orders Only on 12/15/2022  Component Date Value Ref Range Status  . Course ID 12/15/2022 C1_Multi   Final  . Course Start Date 12/15/2022 12/08/2022   Final  . Session Number 12/15/2022 4   Final  . Course First Treatment Date 12/15/2022 12/12/2022  1:32 PM   Final  . Course Last Treatment Date 12/15/2022 12/15/2022  1:26 PM   Final  . Course Elapsed Days 12/15/2022 3   Final  . Reference Point ID 12/15/2022 Humerus_R DP   Final  . Reference Point Dosage Given to Da* 12/15/2022 12  Gy Final  . Reference Point Session Dosage Giv* 12/15/2022 3  Gy Final  . Reference Point ID 12/15/2022 [email or web address] DP   Final  . Reference Point Dosage Given to Da* 12/15/2022 12  Gy Final  . Reference Point Session Dosage Giv* 12/15/2022 3  Gy Final  . Plan ID 12/15/2022 Ext_Humerus_R   Final  . Plan Name 12/15/2022 Rt Arm   Final  . Plan  Fractions Treated to Date 12/15/2022 4   Final  . Plan Total Fractions Prescribed 12/15/2022 10   Final  . Plan Prescribed Dose Per Fraction 12/15/2022 3  Gy Final  . Plan Total  Prescribed Dose 12/15/2022 30.000000  Gy Final  . Plan Primary Reference Point 12/15/2022 Humerus_R DP   Final  . Plan ID 12/15/2022 Spine_T6-7   Final  . Plan Fractions Treated to Date 12/15/2022 4   Final  . Plan Total Fractions Prescribed 12/15/2022 10   Final  . Plan Prescribed Dose Per Fraction 12/15/2022 3  Gy Final  . Plan Total Prescribed Dose 12/15/2022 30.000000  Gy Final  . Plan Primary Reference Point 12/15/2022 Spine_T6-7 DP   Final  Orders Only on 12/14/2022  Component Date Value Ref Range Status  . Course ID 12/14/2022 C1_Multi   Final  . Course Start Date 12/14/2022 12/08/2022   Final  . Session Number 12/14/2022 3   Final  . Course First Treatment Date 12/14/2022 12/12/2022  1:32 PM   Final  . Course Last Treatment Date 12/14/2022 12/14/2022  1:37 PM   Final  . Course Elapsed Days 12/14/2022 2   Final  . Reference Point ID 12/14/2022 Humerus_R DP   Final  . Reference Point Dosage Given to Da* 12/14/2022 9  Gy Final  . Reference Point Session Dosage Giv* 12/14/2022 3  Gy Final  . Reference Point ID 12/14/2022 Spine_T6-7 DP   Final  . Reference Point Dosage Given to Da* 12/14/2022 9  Gy Final  . Reference Point Session Dosage Giv* 12/14/2022 3  Gy Final  . Plan ID 12/14/2022 Ext_Humerus_R   Final  . Plan Name 12/14/2022 Rt Arm   Final  . Plan Fractions Treated to Date 12/14/2022 3   Final  . Plan Total Fractions Prescribed 12/14/2022 10   Final  . Plan Prescribed Dose Per Fraction 12/14/2022 3  Gy Final  . Plan Total Prescribed Dose 12/14/2022 30.000000  Gy Final  . Plan Primary Reference Point 12/14/2022 Humerus_R DP   Final  . Plan ID 12/14/2022 Spine_T6-7   Final  . Plan Fractions Treated to Date 12/14/2022 3   Final  . Plan Total Fractions Prescribed 12/14/2022 10   Final  . Plan Prescribed Dose Per Fraction 12/14/2022 3  Gy Final  . Plan Total Prescribed Dose 12/14/2022 30.000000  Gy Final  . Plan Primary Reference Point 12/14/2022 Spine_T6-7 DP   Final  Orders  Only on 12/13/2022  Component Date Value Ref Range Status  . Course ID 12/13/2022 C1_Multi   Final  . Course Start Date 12/13/2022 12/08/2022   Final  . Session Number 12/13/2022 2   Final  . Course First Treatment Date 12/13/2022 12/12/2022  1:32 PM   Final  . Course Last Treatment Date 12/13/2022 12/13/2022  1:28 PM   Final  . Course Elapsed Days 12/13/2022 1   Final  . Reference Point ID 12/13/2022 Humerus_R DP   Final  . Reference Point Dosage Given to Da* 12/13/2022 6  Gy Final  . Reference Point Session Dosage Giv* 12/13/2022 3  Gy Final  . Reference Point ID 12/13/2022 Spine_T6-7 DP   Final  . Reference Point Dosage Given to Da* 12/13/2022 6  Gy Final  . Reference Point Session Dosage Giv* 12/13/2022 3  Gy Final  . Plan ID 12/13/2022 Ext_Humerus_R   Final  . Plan Name 12/13/2022 Rt Arm   Final  . Plan Fractions Treated to Date 12/13/2022 2   Final  .  Plan Total Fractions Prescribed 12/13/2022 10   Final  . Plan Prescribed Dose Per Fraction 12/13/2022 3  Gy Final  . Plan Total Prescribed Dose 12/13/2022 30.000000  Gy Final  . Plan Primary Reference Point 12/13/2022 Humerus_R DP   Final  . Plan ID 12/13/2022 Spine_T6-7   Final  . Plan Fractions Treated to Date 12/13/2022 2   Final  . Plan Total Fractions Prescribed 12/13/2022 10   Final  . Plan Prescribed Dose Per Fraction 12/13/2022 3  Gy Final  . Plan Total Prescribed Dose 12/13/2022 30.000000  Gy Final  . Plan Primary Reference Point 12/13/2022 Spine_T6-7 DP   Final  Orders Only on 12/12/2022  Component Date Value Ref Range Status  . Course ID 12/12/2022 C1_Multi   Final  . Course Start Date 12/12/2022 12/08/2022   Final  . Session Number 12/12/2022 1   Final  . Course First Treatment Date 12/12/2022 12/12/2022  1:32 PM   Final  . Course Last Treatment Date 12/12/2022 12/12/2022  1:49 PM   Final  . Course Elapsed Days 12/12/2022 0   Final  . Reference Point ID 12/12/2022 Humerus_R DP   Final  . Reference Point Dosage Given  to Da* 12/12/2022 3  Gy Final  . Reference Point Session Dosage Giv* 12/12/2022 3  Gy Final  . Reference Point ID 12/12/2022 Spine_T6-7 DP   Final  . Reference Point Dosage Given to Da* 12/12/2022 3  Gy Final  . Reference Point Session Dosage Giv* 12/12/2022 3  Gy Final  . Plan ID 12/12/2022 Ext_Humerus_R   Final  . Plan Name 12/12/2022 Rt Arm   Final  . Plan Fractions Treated to Date 12/12/2022 1   Final  . Plan Total Fractions Prescribed 12/12/2022 10   Final  . Plan Prescribed Dose Per Fraction 12/12/2022 3  Gy Final  . Plan Total Prescribed Dose 12/12/2022 30.000000  Gy Final  . Plan Primary Reference Point 12/12/2022 Humerus_R DP   Final  . Plan ID 12/12/2022 Spine_T6-7   Final  . Plan Fractions Treated to Date 12/12/2022 1   Final  . Plan Total Fractions Prescribed 12/12/2022 10   Final  . Plan Prescribed Dose Per Fraction 12/12/2022 3  Gy Final  . Plan Total Prescribed Dose 12/12/2022 30.000000  Gy Final  . Plan Primary Reference Point 12/12/2022 Spine_T6-7 DP   Final  Office Visit on 12/01/2022  Component Date Value Ref Range Status  . CEA ([facility]) 12/01/2022 12.09 (H)  0.00 - 5.00 ng/mL Final   Comment: (NOTE) This test was performed using Beckman Coulter's paramagnetic chemiluminescent immunoassay. Values obtained from different assay methods cannot be used interchangeably. Please note that up to 8% of patients who smoke may see values 5.1-10.0 ng/ml and 1% of patients who smoke may see CEA levels >10.0 ng/ml. Performed at [facility], [address] [place], [name] [address]   Appointment on 12/01/2022  Component Date Value Ref Range Status  . Total Protein ELP 12/01/2022 6.5  6.0 - 8.5 g/dL Final  . Albumin ELP 28/41/3244 3.9  2.9 - 4.4 g/dL Final  . WNUUV-2-ZDGUYQIH 12/01/2022 0.3  0.0 - 0.4 g/dL Final  . KVQQV-9-DGLOVFIE 12/01/2022 0.7  0.4 - 1.0 g/dL Final  . Beta Globulin 12/01/2022 0.9  0.7 - 1.3 g/dL Final  . Gamma Globulin 12/01/2022 0.8   0.4 - 1.8 g/dL Final  . M-Spike, % 33/29/5188 Not Observed  Not Observed g/dL Final  . SPE Interp. 12/01/2022 Comment   Final  Comment: (NOTE) The SPE pattern appears unremarkable. Evidence of monoclonal protein is not apparent. Performed At: [facility] [address] [place], [name] [id number] Jolene Schimke MD WU:[id number]   . Comment 12/01/2022 Comment   Final   Comment: (NOTE) Protein electrophoresis scan will follow via computer, mail, or courier delivery.   . Globulin, Total 12/01/2022 2.6  2.2 - 3.9 g/dL Corrected  . A/G Ratio 12/01/2022 1.5  0.7 - 1.7 Corrected  . aPTT 12/01/2022 28  24 - 36 seconds Final   Performed at [facility], [address], [place], [name] [address]  . Prothrombin Time 12/01/2022 13.4  11.4 - 15.2 seconds Final  . INR 12/01/2022 1.0  0.8 - 1.2 Final   Comment: (NOTE) INR goal varies based on device and disease states. Performed at [facility], [address], [place], [name] [address]   . CA 27.29 12/01/2022 111.7 (H)  0.0 - 38.6 U/mL Final   Comment: (NOTE) Siemens Centaur Immunochemiluminometric Methodology [facility]) Values obtained with different assay methods or kits cannot be used interchangeably. Results cannot be interpreted as absolute evidence of the presence or absence of malignant disease. Performed At: [facility] [address] [place], [name] [id number] Jolene Schimke MD MW:[id number]   . Sodium 12/01/2022 143  135 - 145 mmol/L Final  . Potassium 12/01/2022 3.8  3.5 - 5.1 mmol/L Final  . Chloride 12/01/2022 106  98 - 111 mmol/L Final  . CO2 12/01/2022 28  22 - 32 mmol/L Final  . Glucose, Bld 12/01/2022 92  70 - 99 mg/dL Final   Glucose reference range applies only to samples taken after fasting for at least 8 hours.  . BUN 12/01/2022 15  8 - 23 mg/dL Final  . Creatinine 72/53/6644 0.80  0.44 - 1.00 mg/dL Final  . Calcium 03/47/4259 10.3  8.9 - 10.3 mg/dL Final  .  Total Protein 12/01/2022 6.7  6.5 - 8.1 g/dL Final  . Albumin 56/38/7564 4.1  3.5 - 5.0 g/dL Final  . AST 33/29/5188 22  15 - 41 U/L Final  . ALT 12/01/2022 6  0 - 44 U/L Final  . Alkaline Phosphatase 12/01/2022 107  38 - 126 U/L Final  . Total Bilirubin 12/01/2022 1.3 (H)  <1.2 mg/dL Final  . GFR, Estimated 12/01/2022 >60  >60 mL/min Final   Comment: (NOTE) Calculated using the CKD-EPI Creatinine Equation (2021)   . Anion gap 12/01/2022 9  5 - 15 Final   Performed at [facility] at [facility], [address] [place], [name], [address]  . WBC Count 12/01/2022 5.0  4.0 - 10.5 K/uL Final  . RBC 12/01/2022 3.67 (L)  3.87 - 5.11 MIL/uL Final  . Hemoglobin 12/01/2022 11.4 (L)  12.0 - 15.0 g/dL Final  . HCT 63/01/6008 34.8 (L)  36.0 - 46.0 % Final  . MCV 12/01/2022 94.8  80.0 - 100.0 fL Final  . MCH 12/01/2022 31.1  26.0 - 34.0 pg Final  . MCHC 12/01/2022 32.8  30.0 - 36.0 g/dL Final  . RDW 93/23/5573 14.1  11.5 - 15.5 % Final  . Platelet Count 12/01/2022 195  150 - 400 K/uL Final  . nRBC 12/01/2022 0.00  0.0 - 0.2 % Final  . Neutrophils Relative % 12/01/2022 62  % Final  . Neutro Abs 12/01/2022 3.1  1.7 - 7.7 K/uL Final  . Lymphocytes Relative 12/01/2022 26  % Final  . Lymphs Abs 12/01/2022 1.3  0.7 - 4.0 K/uL Final  .  Monocytes Relative 12/01/2022 7  % Final  . Monocytes Absolute 12/01/2022 0.4  0.1 - 1.0 K/uL Final  . Eosinophils Relative 12/01/2022 3  % Final  . Eosinophils Absolute 12/01/2022 0.1  0.0 - 0.5 K/uL Final  . Basophils Relative 12/01/2022 1  % Final  . Basophils Absolute 12/01/2022 0.1  0.0 - 0.1 K/uL Final  . Immature Granulocytes 12/01/2022 1  % Final  . Abs Immature Granulocytes 12/01/2022 0.04  0.00 - 0.07 K/uL Final  . nRBC 12/01/2022 0  0 /100 WBC Final   Performed at [facility] at [facility], [address] [place], [name], [address]    RADIOGRAPHIC STUDIES: I have personally reviewed the radiological images as listed and agree  with the findings in the report  No results found.  ASSESSMENT/PLAN 89 y.o. 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  Treated with neoadjuvant trastuzumab/pertuzumab/paclitaxel with excellent response.   Received 4 cycles of adjuvant Kadcyla developed toxicity, went on to have single agent trastuzumab to complete 1 year of HER2 targeted therapy in March 2023  Stage IV breast cancer  November 01 2022- Presents with word finding difficulties, new onset back pain and weakness of right arm. MRI head - No acute intracranial abnormality. Moderate to severe chronic small vessel ischemic disease with multiple chronic infarcts.  MRI CT spine  Subcentimeter T1 hypointense, stir hyperintense lesion in C6 vertebral body suspicious for metastatic disease.  Multilevel degenerative change throughout the cervical spine resulting spinal canal stenosis at C3-C4, C4-C5, right neural foraminal stenosis at C6-C7.   Disc degeneration most advanced at C6-C7.  Multiple T2 hyperintense lesions throughout the thoracic spine, concerning for metastatic disease. Likely epidural extension of tumor at T7 with  at least mild spinal canal narrowing.    November 23 2022:  PET/CT- Small hypermetabolic focus in the right lateral breast consistent with small focus of breast cancer.  Diffuse hypermetabolic skeletal metastasis involving the axial and appendicular skeleton. Associated pathologic fractures.  No soft tissue metastatic disease.     Cardiac ECHO- LVEF 50-55%   November 28 2022:  MRI T/L spine-  Extensive osseous metastases seen throughout the T/L spine that enhances following contrast.  Degenerative changes.    December 01 2022: SPEP with IEP negative.  CA 27-29 112.  CEA 12.1 CMP notable for T. bili 1.3   December 07, 2022: CT simulation for treatment of thoracic spine and right humerus   December 21 2022:  Scheduled follow up for  metastatic breast cancer.  Received Fraction 7/10 of XRT to T spine and right shoulder.  Will need discussion of chemotherapy options at completion of XRT  Cancer related pain  December 21 2022- Incomplete relief with Ibuprofen.  Not taking Tramadol because of nausea.  Begin Morphine elixer 5 mg every 4 hrs PRN under cover of Ondansetron  Instructed patient to decrease use of Ibuprofen  Decreased mobility of RUE  December 21 2022- Secondary to pain from metastatic disease and spinal stenosis.    Chemotherapy induced neuropathy  December 21 2022- Involves hands and feet.  Not painful.  Limits activity    Cancer Staging  Malignant neoplasm of upper-outer quadrant of right breast in female, estrogen receptor positive (HCC) Staging form: Breast, AJCC 8th Edition - Clinical stage from 12/18/2019: Stage IB (cT2, cN0, cM0, G3, ER+, PR+, HER2+) - Signed by [name], MD on 01/19/2020 Histopathologic type: Infiltrating duct  carcinoma, NOS Stage prefix: Initial diagnosis Method of lymph node assessment: Clinical Nuclear grade: G3 Multigene prognostic tests performed: None Menopausal status: Postmenopausal Ki-67 (%): 40 Stage used in treatment planning: Yes National guidelines used in treatment planning: Yes Type of national guideline used in treatment planning: NCCN    No problem-specific Assessment & Plan notes found for this encounter.    Orders Placed This Encounter  Procedures  . [facility]    Standing Status:   Future    Number of Occurrences:   1    Standing Expiration Date:   12/21/2023    57  minutes was spent in patient care.  This included time spent preparing to see the patient (e.g., review of tests), obtaining and/or reviewing separately obtained history, counseling and educating the patient/family/caregiver, ordering medications, tests, or procedures; documenting clinical information in the electronic or other health record, independently interpreting  results and communicating results to the patient/family/caregiver as well as coordination of care.       All questions were answered. The patient knows to call the clinic with any problems, questions or concerns.  This note was electronically signed.    Loni Muse, MD  12/26/2022 1:15 PM

## 2022-12-21 NOTE — Telephone Encounter (Signed)
Called and spoke with Pharmacist at [facility], she was unsure who would be able who would have Morphine in stock. She did advise that they could order it and have it Friday. The pharmacist also verified if she would need prior auth while on the phone

## 2022-12-26 ENCOUNTER — Encounter: Payer: Self-pay | Admitting: Oncology

## 2022-12-26 ENCOUNTER — Ambulatory Visit
Admission: RE | Admit: 2022-12-26 | Discharge: 2022-12-26 | Disposition: A | Discharge status: Home / Self Care | Payer: Medicare Other | Source: Ambulatory Visit | Attending: Radiation Oncology | Admitting: Radiation Oncology

## 2022-12-26 DIAGNOSIS — G893 Neoplasm related pain (acute) (chronic): Secondary | ICD-10-CM | POA: Diagnosis not present

## 2022-12-26 DIAGNOSIS — C50411 Malignant neoplasm of upper-outer quadrant of right female breast: Secondary | ICD-10-CM | POA: Insufficient documentation

## 2022-12-26 DIAGNOSIS — C7951 Secondary malignant neoplasm of bone: Secondary | ICD-10-CM | POA: Diagnosis present

## 2022-12-26 DIAGNOSIS — C7952 Secondary malignant neoplasm of bone marrow: Secondary | ICD-10-CM | POA: Diagnosis present

## 2022-12-27 ENCOUNTER — Ambulatory Visit: Payer: Medicare Other

## 2022-12-27 ENCOUNTER — Other Ambulatory Visit: Payer: Self-pay

## 2022-12-27 ENCOUNTER — Ambulatory Visit
Admission: RE | Admit: 2022-12-27 | Discharge: 2022-12-27 | Disposition: A | Discharge status: Home / Self Care | Payer: Medicare Other | Source: Ambulatory Visit | Attending: Radiation Oncology

## 2022-12-27 ENCOUNTER — Ambulatory Visit: Payer: Medicare Other | Admitting: Radiation Oncology

## 2022-12-27 DIAGNOSIS — C7951 Secondary malignant neoplasm of bone: Secondary | ICD-10-CM | POA: Diagnosis not present

## 2022-12-27 LAB — RAD ONC ARIA SESSION SUMMARY
Course Elapsed Days: 15
Plan Fractions Treated to Date: 9
Plan Fractions Treated to Date: 9
Plan Prescribed Dose Per Fraction: 3 Gy
Plan Prescribed Dose Per Fraction: 3 Gy
Plan Total Fractions Prescribed: 10
Plan Total Fractions Prescribed: 10
Plan Total Prescribed Dose: 30 Gy
Plan Total Prescribed Dose: 30 Gy
Reference Point Dosage Given to Date: 27 Gy
Reference Point Dosage Given to Date: 27 Gy
Reference Point Session Dosage Given: 3 Gy
Reference Point Session Dosage Given: 3 Gy
Session Number: 8

## 2022-12-28 ENCOUNTER — Other Ambulatory Visit: Payer: Self-pay

## 2022-12-28 ENCOUNTER — Inpatient Hospital Stay (HOSPITAL_BASED_OUTPATIENT_CLINIC_OR_DEPARTMENT_OTHER): Payer: Medicare Other | Admitting: Oncology

## 2022-12-28 ENCOUNTER — Ambulatory Visit
Admission: RE | Admit: 2022-12-28 | Discharge: 2022-12-28 | Disposition: A | Discharge status: Home / Self Care | Payer: Medicare Other | Source: Ambulatory Visit | Attending: Radiation Oncology | Admitting: Radiation Oncology

## 2022-12-28 VITALS — BP 102/57 | HR 74 | Temp 97.5°F | Resp 16 | Ht 64.5 in

## 2022-12-28 DIAGNOSIS — M79601 Pain in right arm: Secondary | ICD-10-CM | POA: Insufficient documentation

## 2022-12-28 DIAGNOSIS — Z923 Personal history of irradiation: Secondary | ICD-10-CM | POA: Insufficient documentation

## 2022-12-28 DIAGNOSIS — IMO0002 Reserved for concepts with insufficient information to code with codable children: Secondary | ICD-10-CM

## 2022-12-28 DIAGNOSIS — Y633 Inadvertent exposure of patient to radiation during medical care: Secondary | ICD-10-CM

## 2022-12-28 DIAGNOSIS — Z8673 Personal history of transient ischemic attack (TIA), and cerebral infarction without residual deficits: Secondary | ICD-10-CM

## 2022-12-28 DIAGNOSIS — Z79899 Other long term (current) drug therapy: Secondary | ICD-10-CM

## 2022-12-28 DIAGNOSIS — G893 Neoplasm related pain (acute) (chronic): Secondary | ICD-10-CM

## 2022-12-28 DIAGNOSIS — C50411 Malignant neoplasm of upper-outer quadrant of right female breast: Secondary | ICD-10-CM

## 2022-12-28 DIAGNOSIS — C7951 Secondary malignant neoplasm of bone: Secondary | ICD-10-CM

## 2022-12-28 DIAGNOSIS — Z170 Estrogen receptor positive status [ER+]: Secondary | ICD-10-CM

## 2022-12-28 DIAGNOSIS — D649 Anemia, unspecified: Secondary | ICD-10-CM

## 2022-12-28 LAB — RAD ONC ARIA SESSION SUMMARY
Course Elapsed Days: 16
Plan Fractions Treated to Date: 10
Plan Fractions Treated to Date: 10
Plan Prescribed Dose Per Fraction: 3 Gy
Plan Prescribed Dose Per Fraction: 3 Gy
Plan Total Fractions Prescribed: 10
Plan Total Fractions Prescribed: 10
Plan Total Prescribed Dose: 30 Gy
Plan Total Prescribed Dose: 30 Gy
Reference Point Dosage Given to Date: 30 Gy
Reference Point Dosage Given to Date: 30 Gy
Reference Point Session Dosage Given: 3 Gy
Reference Point Session Dosage Given: 3 Gy
Session Number: 9

## 2022-12-28 MED ORDER — TRAMADOL HCL 50 MG PO TABS: 50.0000 mg | ORAL_TABLET | Freq: Four times a day (QID) | ORAL | 0 refills | Status: AC | PRN

## 2022-12-28 MED ORDER — LETROZOLE 2.5 MG PO TABS: 2.5000 mg | ORAL_TABLET | Freq: Every day | ORAL | 1 refills | Status: DC

## 2022-12-28 NOTE — Progress Notes (Unsigned)
[facility] Cancer Initial Visit:  Patient Care Team: [name], MD as PCP - General (General Practice) [name], MD as Consulting Physician (Oncology)  CHIEF COMPLAINTS/PURPOSE OF CONSULTATION:  Oncology History  Malignant neoplasm of upper-outer quadrant of right breast in female, estrogen receptor positive (HCC)  12/18/2019 Cancer Staging   Staging form: Breast, AJCC 8th Edition - Clinical stage from 12/18/2019: Stage IB (cT2, cN0, cM0, G3, ER+, PR+, HER2+) - Signed by [name], MD on 01/19/2020   01/08/2020 Initial Diagnosis   Breast cancer, right (HCC)   01/28/2020 - 03/26/2020 Chemotherapy    Patient is on Treatment Plan: BREAST TRASTUZUMAB + PERTUZUMAB Q21D X 11 CYCLES      06/01/2020 - 06/01/2020 Chemotherapy         06/17/2020 - 07/29/2020 Chemotherapy         09/17/2020 - 04/16/2021 Chemotherapy   Patient is on Treatment Plan : BREAST Trastuzumab q21d X 11 Cycles       HISTORY OF PRESENTING ILLNESS: Brandy Jacobs 89 y.o. female is here because of metastatic breast cancer  November 2021:  Diagnosed with Stage IB hormone and HER2 receptor positive breast cancer.  Received neoadjuvant trastuzumab/pertuzumab/paclitaxel with excellent response.  Received 4 cycles of adjuvant Kadcyla but this was discontinued due to toxicities. She went on to have single agent trastuzumab to complete 1 year of HER2 targeted therapy in March 2023  November 01 2022- Presents with word finding difficulties, new onset back pain and weakness of right arm. MRI head - No acute intracranial abnormality. Moderate to severe chronic small vessel ischemic disease with multiple chronic infarcts.  MRI CT spine  Subcentimeter T1 hypointense, stir hyperintense lesion in C6 vertebral body suspicious for metastatic disease.  Multilevel degenerative change throughout the cervical spine resulting spinal canal stenosis at C3-C4, C4-C5, right neural foraminal stenosis at C6-C7.    Disc degeneration most advanced at C6-C7.  Multiple T2 hyperintense lesions throughout the thoracic spine, concerning for metastatic disease. Likely epidural extension of tumor at T7 with  at least mild spinal canal narrowing.   November 14 2022:  Radiation Oncology Consult- Awaiting results of PET/CT  November 23 2022:  PET/CT- Small hypermetabolic focus in the right lateral breast consistent with small focus of breast cancer.  Diffuse hypermetabolic skeletal metastasis involving the axial and appendicular skeleton. Associated pathologic fractures.  No soft tissue metastatic disease.   Cardiac ECHO- LVEF 50-55%  November 28 2022:  MRI T/L spine-  Extensive osseous metastases seen throughout the T/L spine that enhances following contrast.  Degenerative changes.   December 01 2022: WBC 5.0 hemoglobin 11.4 platelet count 195; 62 segs 26 lymphs 7 monos 3 eos 1 basophil.  INR 1.0 PTT 28 SPEP with IP showed no paraprotein CA 27-29 112.  CEA 12.1 CMP notable for T. bili 1.3  December 07, 2022: CT simulation for treatment of thoracic spine and right humerus  December 21 2022:   Received Fraction 7/10 of XRT to T spine and right shoulder.  Here with daughter.  Can't lift right arm at shoulder.  Having pain in right arm and thoracic spine.  Not taking Tramadol because of nausea but takes ibuprofen.  Patient is staying by herself and does not have help in the home.  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  The neuropathy is not painful.  Patient and daughter have talked to our Child psychotherapist.    December 28 2022:  Scheduled follow up for  metastatic breast cancer.  Completed XRT today.  Decided not to take the Morphine as written for at last visit.  She is taking Tramadol 50 mg every 4 to 6 hrs with incomplete relief of pain.  Takes something to help sleep at night- written by PCP.  No nausea.  Will begin Letrazole 2.5 mg daily  Review of Systems - Oncology  MEDICAL HISTORY: Past Medical  History:  Diagnosis Date   Anemia 10/26/2022   Arthritis    osteoarthritis   Breast cancer (HCC)    Diabetes mellitus without complication (HCC)    not on medication   History of CVA (cerebrovascular accident)    Hypertension    Multiple thyroid nodules    benign   Right arm weakness 10/26/2022   Upper back pain 10/26/2022   Word finding difficulty 10/26/2022    SURGICAL HISTORY: Past Surgical History:  Procedure Laterality Date   BIOPSY THYROID     benign   BREAST BIOPSY     BREAST LUMPECTOMY W/ NEEDLE LOCALIZATION Right 05/04/2020    SOCIAL HISTORY: Social History   Socioeconomic History   Marital status: Widowed    Spouse name: Not on file   Number of children: 2   Years of education: Not on file   Highest education level: Not on file  Occupational History   Not on file  Tobacco Use   Smoking status: Never   Smokeless tobacco: Never  Substance and Sexual Activity   Alcohol use: Not Currently   Drug use: Never   Sexual activity: Not Currently  Other Topics Concern   Not on file  Social History Narrative   Not on file   Social Determinants of Health   Financial Resource Strain: Not on file  Food Insecurity: Not on file  Transportation Needs: Not on file  Physical Activity: Not on file  Stress: Not on file  Social Connections: Not on file  Intimate Partner Violence: Not on file    FAMILY HISTORY Family History  Problem Relation Age of Onset   Colon cancer Father        71s   Breast cancer Sister        42-60   Colon cancer Brother        58s   Prostate cancer Brother        late 77s   Breast cancer Half-Sister        late 64s early 74s    ALLERGIES:  has No Known Allergies.  MEDICATIONS:  Current Outpatient Medications  Medication Sig Dispense Refill   acetaminophen (TYLENOL) 500 MG tablet Take 500 mg by mouth at bedtime.     aspirin EC 81 MG tablet Take 81 mg by mouth daily. Swallow whole.     atorvastatin (LIPITOR) 40 MG tablet Take  40 mg by mouth daily.     cyanocobalamin (VITAMIN B12) 500 MCG tablet Take 1 tablet (500 mcg total) by mouth daily.     losartan (COZAAR) 50 MG tablet Take 50 mg by mouth daily.     morphine 20 MG/5ML solution Take 1.3 mLs (5.2 mg total) by mouth every 4 (four) hours as needed for pain. 100 mL 0   ondansetron (ZOFRAN-ODT) 4 MG disintegrating tablet Take 1 tablet (4 mg total) by mouth every 6 (six) hours as needed for nausea or vomiting. 60 tablet 3   traMADol (ULTRAM) 50 MG tablet Take 1 tablet (50 mg total) by mouth every 6 (six) hours as needed. 30 tablet 0  triamcinolone cream (KENALOG) 0.1 % SMARTSIG:1 Application Topical 2-3 Times Daily     Vitamin D3 (VITAMIN D) 25 MCG tablet Take 1,000 Units by mouth daily.     No current facility-administered medications for this visit.    PHYSICAL EXAMINATION:  ECOG PERFORMANCE STATUS: 2 - Symptomatic, <50% confined to bed   There were no vitals filed for this visit.   There were no vitals filed for this visit.    Physical Exam Vitals and nursing note reviewed.  Constitutional:      Appearance: Normal appearance. She is ill-appearing. She is not toxic-appearing or diaphoretic.     Comments: Frail.  Elderly seated in wheelchair  HENT:     Head: Normocephalic and atraumatic.     Right Ear: External ear normal.     Left Ear: External ear normal.     Nose: Nose normal. No congestion or rhinorrhea.  Eyes:     General: No scleral icterus.    Extraocular Movements: Extraocular movements intact.     Conjunctiva/sclera: Conjunctivae normal.     Pupils: Pupils are equal, round, and reactive to light.  Cardiovascular:     Rate and Rhythm: Normal rate and regular rhythm.     Heart sounds: Normal heart sounds. No murmur heard.    No friction rub. No gallop.  Pulmonary:     Effort: Pulmonary effort is normal. No respiratory distress.     Breath sounds: Normal breath sounds.  Abdominal:     General: Bowel sounds are normal.     Palpations:  Abdomen is soft.     Tenderness: There is no abdominal tenderness. There is no guarding or rebound.  Musculoskeletal:        General: No swelling, tenderness or deformity.     Cervical back: Normal range of motion and neck supple. No rigidity or tenderness.     Right lower leg: No edema.     Left lower leg: No edema.     Comments: Decreased muscle mass  Lymphadenopathy:     Head:     Right side of head: No submental, submandibular, tonsillar, preauricular, posterior auricular or occipital adenopathy.     Left side of head: No submental, submandibular, tonsillar, preauricular, posterior auricular or occipital adenopathy.     Cervical: No cervical adenopathy.     Right cervical: No superficial, deep or posterior cervical adenopathy.    Left cervical: No superficial, deep or posterior cervical adenopathy.     Upper Body:     Right upper body: No supraclavicular, axillary, pectoral or epitrochlear adenopathy.     Left upper body: No supraclavicular, axillary, pectoral or epitrochlear adenopathy.  Skin:    General: Skin is warm and dry.     Coloration: Skin is not jaundiced.  Neurological:     General: No focal deficit present.     Mental Status: She is alert and oriented to person, place, and time.     Cranial Nerves: No cranial nerve deficit.  Psychiatric:        Mood and Affect: Mood normal.        Behavior: Behavior normal.        Thought Content: Thought content normal.        Judgment: Judgment normal.    LABORATORY DATA: I have personally reviewed the data as listed:  Orders Only on 12/27/2022  Component Date Value Ref Range Status   Course ID 12/27/2022 [email or web address]_Multi   Final   Course Start Date 12/27/2022 12/08/2022   Final  Session Number 12/27/2022 8   Final   Course First Treatment Date 12/27/2022 12/12/2022  1:32 PM   Final   Course Last Treatment Date 12/27/2022 12/27/2022  1:19 PM   Final   Course Elapsed Days 12/27/2022 15   Final   Reference Point ID 12/27/2022  Humerus_R DP   Final   Reference Point Dosage Given to Da* 12/27/2022 27  Gy Final   Reference Point Session Dosage Giv* 12/27/2022 3  Gy Final   Reference Point ID 12/27/2022 [email or web address] DP   Final   Reference Point Dosage Given to Da* 12/27/2022 27  Gy Final   Reference Point Session Dosage Giv* 12/27/2022 3  Gy Final   Plan ID 12/27/2022 Ext_Humerus_R   Final   Plan Name 12/27/2022 Rt Arm   Final   Plan Fractions Treated to Date 12/27/2022 9   Final   Plan Total Fractions Prescribed 12/27/2022 10   Final   Plan Prescribed Dose Per Fraction 12/27/2022 3  Gy Final   Plan Total Prescribed Dose 12/27/2022 30.000000  Gy Final   Plan Primary Reference Point 12/27/2022 Humerus_R DP   Final   Plan ID 12/27/2022 [email or web address]   Final   Plan Fractions Treated to Date 12/27/2022 9   Final   Plan Total Fractions Prescribed 12/27/2022 10   Final   Plan Prescribed Dose Per Fraction 12/27/2022 3  Gy Final   Plan Total Prescribed Dose 12/27/2022 30.000000  Gy Final   Plan Primary Reference Point 12/27/2022 Spine_T6-7 DP   Final  Appointment on 12/21/2022  Component Date Value Ref Range Status   Sodium 12/21/2022 139  135 - 145 mmol/L Final   Potassium 12/21/2022 4.3  3.5 - 5.1 mmol/L Final   Chloride 12/21/2022 103  98 - 111 mmol/L Final   CO2 12/21/2022 28  22 - 32 mmol/L Final   Glucose, Bld 12/21/2022 93  70 - 99 mg/dL Final   Glucose reference range applies only to samples taken after fasting for at least 8 hours.   BUN 12/21/2022 14  8 - 23 mg/dL Final   Creatinine 16/10/9602 0.77  0.44 - 1.00 mg/dL Final   Calcium 54/09/8117 10.8 (H)  8.9 - 10.3 mg/dL Final   Total Protein 14/78/2956 6.8  6.5 - 8.1 g/dL Final   Albumin 21/30/8657 4.0  3.5 - 5.0 g/dL Final   AST 84/69/6295 24  15 - 41 U/L Final   ALT 12/21/2022 8  0 - 44 U/L Final   Alkaline Phosphatase 12/21/2022 111  38 - 126 U/L Final   Total Bilirubin 12/21/2022 1.5 (H)  <1.2 mg/dL Final   GFR, Estimated 12/21/2022 >60  >60 mL/min Final    Comment: (NOTE) Calculated using the CKD-EPI Creatinine Equation (2021)    Anion gap 12/21/2022 9  5 - 15 Final   Performed at [facility], [address] [place], [name], [address]   WBC Count 12/21/2022 5.6  4.0 - 10.5 K/uL Final   RBC 12/21/2022 3.55 (L)  3.87 - 5.11 MIL/uL Final   Hemoglobin 12/21/2022 11.0 (L)  12.0 - 15.0 g/dL Final   HCT 24/40/1027 34.5 (L)  36.0 - 46.0 % Final   MCV 12/21/2022 97.2  80.0 - 100.0 fL Final   MCH 12/21/2022 31.0  26.0 - 34.0 pg Final   MCHC 12/21/2022 31.9  30.0 - 36.0 g/dL Final   RDW 25/36/6440 14.4  11.5 - 15.5 % Final   Platelet Count 12/21/2022 178  150 -  400 K/uL Final   nRBC 12/21/2022 0.00  0.0 - 0.2 % Final   Neutrophils Relative % 12/21/2022 70  % Final   Neutro Abs 12/21/2022 3.9  1.7 - 7.7 K/uL Final   Lymphocytes Relative 12/21/2022 16  % Final   Lymphs Abs 12/21/2022 0.9  0.7 - 4.0 K/uL Final   Monocytes Relative 12/21/2022 10  % Final   Monocytes Absolute 12/21/2022 0.6  0.1 - 1.0 K/uL Final   Eosinophils Relative 12/21/2022 2  % Final   Eosinophils Absolute 12/21/2022 0.1  0.0 - 0.5 K/uL Final   Basophils Relative 12/21/2022 1  % Final   Basophils Absolute 12/21/2022 0.1  0.0 - 0.1 K/uL Final   Immature Granulocytes 12/21/2022 1  % Final   Abs Immature Granulocytes 12/21/2022 0.08 (H)  0.00 - 0.07 K/uL Final   nRBC 12/21/2022 0  0 /100 WBC Final   Performed at [facility], [address], [place], [name], [address]  Orders Only on 12/21/2022  Component Date Value Ref Range Status   Course ID 12/21/2022 [email or web address]_Multi   Final   Course Start Date 12/21/2022 12/08/2022   Final   Session Number 12/21/2022 6   Final   Course First Treatment Date 12/21/2022 12/12/2022  1:32 PM   Final   Course Last Treatment Date 12/21/2022 12/21/2022  1:03 PM   Final   Course Elapsed Days 12/21/2022 9   Final   Reference Point ID 12/21/2022 Humerus_R DP   Final   Reference Point Dosage Given  to Da* 12/21/2022 21  Gy Final   Reference Point Session Dosage Giv* 12/21/2022 3  Gy Final   Reference Point ID 12/21/2022 [email or web address] DP   Final   Reference Point Dosage Given to Da* 12/21/2022 21  Gy Final   Reference Point Session Dosage Giv* 12/21/2022 4.578  Gy Final   Plan ID 12/21/2022 Ext_Humerus_R   Final   Plan Name 12/21/2022 Rt Arm   Final   Plan Fractions Treated to Date 12/21/2022 7   Final   Plan Total Fractions Prescribed 12/21/2022 10   Final   Plan Prescribed Dose Per Fraction 12/21/2022 3  Gy Final   Plan Total Prescribed Dose 12/21/2022 30.000000  Gy Final   Plan Primary Reference Point 12/21/2022 Humerus_R DP   Final   Plan ID 12/21/2022 [email or web address]   Final   Plan Fractions Treated to Date 12/21/2022 7   Final   Plan Total Fractions Prescribed 12/21/2022 10   Final   Plan Prescribed Dose Per Fraction 12/21/2022 3  Gy Final   Plan Total Prescribed Dose 12/21/2022 30.000000  Gy Final   Plan Primary Reference Point 12/21/2022 Spine_T6-7 DP   Final  Orders Only on 12/16/2022  Component Date Value Ref Range Status   Course ID 12/16/2022 C1_Multi   Final   Course Start Date 12/16/2022 12/08/2022   Final   Session Number 12/16/2022 5   Final   Course First Treatment Date 12/16/2022 12/12/2022  1:32 PM   Final   Course Last Treatment Date 12/16/2022 12/16/2022  1:37 PM   Final   Course Elapsed Days 12/16/2022 4   Final   Reference Point ID 12/16/2022 Humerus_R DP   Final   Reference Point Dosage Given to Da* 12/16/2022 15  Gy Final   Reference Point Session Dosage Giv* 12/16/2022 3  Gy Final   Reference Point ID 12/16/2022 [email or web address] DP   Final   Reference Point Dosage Given to Da* 12/16/2022 15  Gy Final   Reference Point Session Dosage Giv* 12/16/2022 3  Gy Final   Plan ID 12/16/2022 Ext_Humerus_R   Final   Plan Name 12/16/2022 Rt Arm   Final   Plan Fractions Treated to Date 12/16/2022 5   Final   Plan Total Fractions Prescribed 12/16/2022 10   Final   Plan  Prescribed Dose Per Fraction 12/16/2022 3  Gy Final   Plan Total Prescribed Dose 12/16/2022 30.000000  Gy Final   Plan Primary Reference Point 12/16/2022 Humerus_R DP   Final   Plan ID 12/16/2022 [email or web address]   Final   Plan Fractions Treated to Date 12/16/2022 5   Final   Plan Total Fractions Prescribed 12/16/2022 10   Final   Plan Prescribed Dose Per Fraction 12/16/2022 3  Gy Final   Plan Total Prescribed Dose 12/16/2022 30.000000  Gy Final   Plan Primary Reference Point 12/16/2022 Spine_T6-7 DP   Final  Orders Only on 12/15/2022  Component Date Value Ref Range Status   Course ID 12/15/2022 C1_Multi   Final   Course Start Date 12/15/2022 12/08/2022   Final   Session Number 12/15/2022 4   Final   Course First Treatment Date 12/15/2022 12/12/2022  1:32 PM   Final   Course Last Treatment Date 12/15/2022 12/15/2022  1:26 PM   Final   Course Elapsed Days 12/15/2022 3   Final   Reference Point ID 12/15/2022 Humerus_R DP   Final   Reference Point Dosage Given to Da* 12/15/2022 12  Gy Final   Reference Point Session Dosage Giv* 12/15/2022 3  Gy Final   Reference Point ID 12/15/2022 [email or web address] DP   Final   Reference Point Dosage Given to Da* 12/15/2022 12  Gy Final   Reference Point Session Dosage Giv* 12/15/2022 3  Gy Final   Plan ID 12/15/2022 Ext_Humerus_R   Final   Plan Name 12/15/2022 Rt Arm   Final   Plan Fractions Treated to Date 12/15/2022 4   Final   Plan Total Fractions Prescribed 12/15/2022 10   Final   Plan Prescribed Dose Per Fraction 12/15/2022 3  Gy Final   Plan Total Prescribed Dose 12/15/2022 30.000000  Gy Final   Plan Primary Reference Point 12/15/2022 Humerus_R DP   Final   Plan ID 12/15/2022 [email or web address]   Final   Plan Fractions Treated to Date 12/15/2022 4   Final   Plan Total Fractions Prescribed 12/15/2022 10   Final   Plan Prescribed Dose Per Fraction 12/15/2022 3  Gy Final   Plan Total Prescribed Dose 12/15/2022 30.000000  Gy Final   Plan Primary Reference Point  12/15/2022 Spine_T6-7 DP   Final  Orders Only on 12/14/2022  Component Date Value Ref Range Status   Course ID 12/14/2022 C1_Multi   Final   Course Start Date 12/14/2022 12/08/2022   Final   Session Number 12/14/2022 3   Final   Course First Treatment Date 12/14/2022 12/12/2022  1:32 PM   Final   Course Last Treatment Date 12/14/2022 12/14/2022  1:37 PM   Final   Course Elapsed Days 12/14/2022 2   Final   Reference Point ID 12/14/2022 Humerus_R DP   Final   Reference Point Dosage Given to Da* 12/14/2022 9  Gy Final   Reference Point Session Dosage Giv* 12/14/2022 3  Gy Final   Reference Point ID 12/14/2022 Spine_T6-7 DP   Final   Reference Point Dosage Given to Da* 12/14/2022 9  Gy Final   Reference Point Session Dosage Giv* 12/14/2022  3  Gy Final   Plan ID 12/14/2022 Ext_Humerus_R   Final   Plan Name 12/14/2022 Rt Arm   Final   Plan Fractions Treated to Date 12/14/2022 3   Final   Plan Total Fractions Prescribed 12/14/2022 10   Final   Plan Prescribed Dose Per Fraction 12/14/2022 3  Gy Final   Plan Total Prescribed Dose 12/14/2022 30.000000  Gy Final   Plan Primary Reference Point 12/14/2022 Humerus_R DP   Final   Plan ID 12/14/2022 [email or web address]   Final   Plan Fractions Treated to Date 12/14/2022 3   Final   Plan Total Fractions Prescribed 12/14/2022 10   Final   Plan Prescribed Dose Per Fraction 12/14/2022 3  Gy Final   Plan Total Prescribed Dose 12/14/2022 30.000000  Gy Final   Plan Primary Reference Point 12/14/2022 Spine_T6-7 DP   Final  Orders Only on 12/13/2022  Component Date Value Ref Range Status   Course ID 12/13/2022 C1_Multi   Final   Course Start Date 12/13/2022 12/08/2022   Final   Session Number 12/13/2022 2   Final   Course First Treatment Date 12/13/2022 12/12/2022  1:32 PM   Final   Course Last Treatment Date 12/13/2022 12/13/2022  1:28 PM   Final   Course Elapsed Days 12/13/2022 1   Final   Reference Point ID 12/13/2022 Humerus_R DP   Final   Reference Point  Dosage Given to Da* 12/13/2022 6  Gy Final   Reference Point Session Dosage Giv* 12/13/2022 3  Gy Final   Reference Point ID 12/13/2022 Spine_T6-7 DP   Final   Reference Point Dosage Given to Da* 12/13/2022 6  Gy Final   Reference Point Session Dosage Giv* 12/13/2022 3  Gy Final   Plan ID 12/13/2022 Ext_Humerus_R   Final   Plan Name 12/13/2022 Rt Arm   Final   Plan Fractions Treated to Date 12/13/2022 2   Final   Plan Total Fractions Prescribed 12/13/2022 10   Final   Plan Prescribed Dose Per Fraction 12/13/2022 3  Gy Final   Plan Total Prescribed Dose 12/13/2022 30.000000  Gy Final   Plan Primary Reference Point 12/13/2022 Humerus_R DP   Final   Plan ID 12/13/2022 [email or web address]   Final   Plan Fractions Treated to Date 12/13/2022 2   Final   Plan Total Fractions Prescribed 12/13/2022 10   Final   Plan Prescribed Dose Per Fraction 12/13/2022 3  Gy Final   Plan Total Prescribed Dose 12/13/2022 30.000000  Gy Final   Plan Primary Reference Point 12/13/2022 Spine_T6-7 DP   Final  Orders Only on 12/12/2022  Component Date Value Ref Range Status   Course ID 12/12/2022 C1_Multi   Final   Course Start Date 12/12/2022 12/08/2022   Final   Session Number 12/12/2022 1   Final   Course First Treatment Date 12/12/2022 12/12/2022  1:32 PM   Final   Course Last Treatment Date 12/12/2022 12/12/2022  1:49 PM   Final   Course Elapsed Days 12/12/2022 0   Final   Reference Point ID 12/12/2022 Humerus_R DP   Final   Reference Point Dosage Given to Da* 12/12/2022 3  Gy Final   Reference Point Session Dosage Giv* 12/12/2022 3  Gy Final   Reference Point ID 12/12/2022 Spine_[email or web address] DP   Final   Reference Point Dosage Given to Da* 12/12/2022 3  Gy Final   Reference Point Session Dosage Giv* 12/12/2022 3  Gy Final   Plan ID 12/12/2022 Ext_Humerus_R  Final   Plan Name 12/12/2022 Rt Arm   Final   Plan Fractions Treated to Date 12/12/2022 1   Final   Plan Total Fractions Prescribed 12/12/2022 10   Final   Plan  Prescribed Dose Per Fraction 12/12/2022 3  Gy Final   Plan Total Prescribed Dose 12/12/2022 30.000000  Gy Final   Plan Primary Reference Point 12/12/2022 Humerus_R DP   Final   Plan ID 12/12/2022 Spine_[email or web address]   Final   Plan Fractions Treated to Date 12/12/2022 1   Final   Plan Total Fractions Prescribed 12/12/2022 10   Final   Plan Prescribed Dose Per Fraction 12/12/2022 3  Gy Final   Plan Total Prescribed Dose 12/12/2022 30.000000  Gy Final   Plan Primary Reference Point 12/12/2022 Spine_T6-7 DP   Final  Office Visit on 12/01/2022  Component Date Value Ref Range Status   CEA ([facility]) 12/01/2022 12.09 (H)  0.00 - 5.00 ng/mL Final   Comment: (NOTE) This test was performed using Beck[employer] Coulter's paramagnetic chemiluminescent immunoassay. Values obtained from different assay methods cannot be used interchangeably. Please note that up to 8% of patients who smoke may see values 5.1-10.0 ng/ml and 1% of patients who smoke may see CEA levels >10.0 ng/ml. Performed at [facility], [address] [place], [name] [address]   Appointment on 12/01/2022  Component Date Value Ref Range Status   Total Protein ELP 12/01/2022 6.5  6.0 - 8.5 g/dL Final   Albumin ELP 60/45/4098 3.9  2.9 - 4.4 g/dL Final   JXBJY-7-WGNFAOZH 12/01/2022 0.3  0.0 - 0.4 g/dL Final   YQMVH-8-IONGEXBM 12/01/2022 0.7  0.4 - 1.0 g/dL Final   Beta Globulin 84/13/2440 0.9  0.7 - 1.3 g/dL Final   Gamma Globulin 12/01/2022 0.8  0.4 - 1.8 g/dL Final   M-Spike, % 11/20/2534 Not Observed  Not Observed g/dL Final   SPE Interp. 12/01/2022 Comment   Final   Comment: (NOTE) The SPE pattern appears unremarkable. Evidence of monoclonal protein is not apparent. Performed At: [facility] [address] [place], [name] [id number] Jolene Schimke MD VZ:[id number]    Comment 12/01/2022 Comment   Final   Comment: (NOTE) Protein electrophoresis scan will follow via computer, mail, or courier delivery.     Globulin, Total 12/01/2022 2.6  2.2 - 3.9 g/dL Corrected   A/G Ratio 29/51/8841 1.5  0.7 - 1.7 Corrected   aPTT 12/01/2022 28  24 - 36 seconds Final   Performed at [facility], [address] [place], [name] [address]   Prothrombin Time 12/01/2022 13.4  11.4 - 15.2 seconds Final   INR 12/01/2022 1.0  0.8 - 1.2 Final   Comment: (NOTE) INR goal varies based on device and disease states. Performed at [facility], [address] [place], [name] [address]    CA 27.29 12/01/2022 111.7 (H)  0.0 - 38.6 U/mL Final   Comment: (NOTE) Siemens Centaur Immunochemiluminometric Methodology [facility] [place]) Values obtained with different assay methods or kits cannot be used interchangeably. Results cannot be interpreted as absolute evidence of the presence or absence of malignant disease. Performed At: [facility] [address] [place], [name] [id number] Jolene Schimke MD UK:[id number]    Sodium 12/01/2022 143  135 - 145 mmol/L Final   Potassium 12/01/2022 3.8  3.5 - 5.1 mmol/L Final   Chloride 12/01/2022 106  98 - 111 mmol/L Final   CO2 12/01/2022 28  22 - 32 mmol/L Final   Glucose, Bld 12/01/2022 92  70 - 99 mg/dL Final   Glucose reference range applies only to samples taken after fasting for at least 8 hours.   BUN 12/01/2022 15  8 - 23 mg/dL Final   Creatinine 40/98/1191 0.80  0.44 - 1.00 mg/dL Final   Calcium 47/82/9562 10.3  8.9 - 10.3 mg/dL Final   Total Protein 13/08/6576 6.7  6.5 - 8.1 g/dL Final   Albumin 46/96/2952 4.1  3.5 - 5.0 g/dL Final   AST 84/13/2440 22  15 - 41 U/L Final   ALT 12/01/2022 6  0 - 44 U/L Final   Alkaline Phosphatase 12/01/2022 107  38 - 126 U/L Final   Total Bilirubin 12/01/2022 1.3 (H)  <1.2 mg/dL Final   GFR, Estimated 12/01/2022 >60  >60 mL/min Final   Comment: (NOTE) Calculated using the CKD-EPI Creatinine Equation (2021)    Anion gap 12/01/2022 9  5 - 15 Final   Performed at [facility] at  [facility], [address] [place], [name], [address]   WBC Count 12/01/2022 5.0  4.0 - 10.5 K/uL Final   RBC 12/01/2022 3.67 (L)  3.87 - 5.11 MIL/uL Final   Hemoglobin 12/01/2022 11.4 (L)  12.0 - 15.0 g/dL Final   HCT 53/66/4403 34.8 (L)  36.0 - 46.0 % Final   MCV 12/01/2022 94.8  80.0 - 100.0 fL Final   MCH 12/01/2022 31.1  26.0 - 34.0 pg Final   MCHC 12/01/2022 32.8  30.0 - 36.0 g/dL Final   RDW 47/42/5956 14.1  11.5 - 15.5 % Final   Platelet Count 12/01/2022 195  150 - 400 K/uL Final   nRBC 12/01/2022 0.00  0.0 - 0.2 % Final   Neutrophils Relative % 12/01/2022 62  % Final   Neutro Abs 12/01/2022 3.1  1.7 - 7.7 K/uL Final   Lymphocytes Relative 12/01/2022 26  % Final   Lymphs Abs 12/01/2022 1.3  0.7 - 4.0 K/uL Final   Monocytes Relative 12/01/2022 7  % Final   Monocytes Absolute 12/01/2022 0.4  0.1 - 1.0 K/uL Final   Eosinophils Relative 12/01/2022 3  % Final   Eosinophils Absolute 12/01/2022 0.1  0.0 - 0.5 K/uL Final   Basophils Relative 12/01/2022 1  % Final   Basophils Absolute 12/01/2022 0.1  0.0 - 0.1 K/uL Final   Immature Granulocytes 12/01/2022 1  % Final   Abs Immature Granulocytes 12/01/2022 0.04  0.00 - 0.07 K/uL Final   nRBC 12/01/2022 0  0 /100 WBC Final   Performed at [facility] at [facility], [address], [place], [name], [address]  There may be more visits with results that are not included.    RADIOGRAPHIC STUDIES: I have personally reviewed the radiological images as listed and agree with the findings in the report  No results found.  ASSESSMENT/PLAN 89 y.o. 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  Treated with neoadjuvant trastuzumab/pertuzumab/paclitaxel with excellent response.   Received 4 cycles of adjuvant Kadcyla developed toxicity, went on to have single agent trastuzumab to complete 1 year of HER2 targeted therapy in March 2023  Stage IV breast  cancer  November 01 2022- Presents with word finding difficulties, new onset back pain and weakness of right arm. MRI head - No acute intracranial abnormality. Moderate to severe chronic small vessel ischemic disease with multiple chronic infarcts.  MRI CT spine  Subcentimeter T1 hypointense, stir hyperintense lesion in C6  vertebral body suspicious for metastatic disease.  Multilevel degenerative change throughout the cervical spine resulting spinal canal stenosis at C3-C4, C4-C5, right neural foraminal stenosis at C6-C7.   Disc degeneration most advanced at C6-C7.  Multiple T2 hyperintense lesions throughout the thoracic spine, concerning for metastatic disease. Likely epidural extension of tumor at T7 with  at least mild spinal canal narrowing.    November 23 2022:  PET/CT- Small hypermetabolic focus in the right lateral breast consistent with small focus of breast cancer.  Diffuse hypermetabolic skeletal metastasis involving the axial and appendicular skeleton. Associated pathologic fractures.  No soft tissue metastatic disease.     Cardiac ECHO- LVEF 50-55%   November 28 2022:  MRI T/L spine-  Extensive osseous metastases seen throughout the T/L spine that enhances following contrast.  Degenerative changes.    December 01 2022: SPEP with IEP negative.  CA 27-29 112.  CEA 12.1 CMP notable for T. bili 1.3   December 07, 2022: CT simulation for treatment of thoracic spine and right humerus   December 21 2022:  Scheduled follow up for metastatic breast cancer.  Received Fraction 7/10 of XRT to T spine and right shoulder.  Will need discussion of chemotherapy options at completion of XRT  Cancer related pain  December 21 2022- Incomplete relief with Ibuprofen.  Not taking Tramadol because of nausea.  Begin Morphine elixer 5 mg every 4 hrs PRN under cover of Ondansetron  Instructed patient to decrease use of Ibuprofen  Decreased mobility of RUE  December 21 2022- Secondary to pain from metastatic  disease and spinal stenosis.    Chemotherapy induced neuropathy  December 21 2022- Involves hands and feet.  Not painful.  Limits activity    Cancer Staging  Malignant neoplasm of upper-outer quadrant of right breast in female, estrogen receptor positive (HCC) Staging form: Breast, AJCC 8th Edition - Clinical stage from 12/18/2019: Stage IB (cT2, cN0, cM0, G3, ER+, PR+, HER2+) - Signed by [name], MD on 01/19/2020 Histopathologic type: Infiltrating duct carcinoma, NOS Stage prefix: Initial diagnosis Method of lymph node assessment: Clinical Nuclear grade: G3 Multigene prognostic tests performed: None Menopausal status: Postmenopausal Ki-67 (%): 40 Stage used in treatment planning: Yes National guidelines used in treatment planning: Yes Type of national guideline used in treatment planning: NCCN    No problem-specific Assessment & Plan notes found for this encounter.    No orders of the defined types were placed in this encounter.   57  minutes was spent in patient care.  This included time spent preparing to see the patient (e.g., review of tests), obtaining and/or reviewing separately obtained history, counseling and educating the patient/family/caregiver, ordering medications, tests, or procedures; documenting clinical information in the electronic or other health record, independently interpreting results and communicating results to the patient/family/caregiver as well as coordination of care.       All questions were answered. The patient knows to call the clinic with any problems, questions or concerns.  This note was electronically signed.    Loni Muse, MD  12/28/2022 12:37 PM

## 2022-12-29 NOTE — Radiation Completion Notes (Addendum)
Patient Name: Brandy Jacobs, Brandy Jacobs MRN: [id number] Date of Birth: May 21, 1932 Referring Physician:  , M.D. Date of Service: 2022-12-29 Radiation Oncologist:  , M.D. [facility]                             RADIATION ONCOLOGY END OF TREATMENT NOTE     Diagnosis: C79.51 Secondary malignant neoplasm of bone Staging on 2019-12-18: Malignant neoplasm of upper-outer quadrant of right breast in female, estrogen receptor positive (HCC) T=cT2, N=cN0, M=cM0 Intent: Palliative     ==========DELIVERED PLANS==========  First Treatment Date: 2022-12-12 Last Treatment Date: 2022-12-28   Plan Name: Spine_[id number] Site: Thoracic Spine Technique: 3D Mode: Photon Dose Per Fraction: 3 Gy Prescribed Dose (Delivered / Prescribed): 30 Gy / 30 Gy Prescribed Fxs (Delivered / Prescribed): 10 / 10   Plan Name: Ext_Humerus_R Site: Humerus, Right Technique: Isodose Plan Mode: Photon Dose Per Fraction: 3 Gy Prescribed Dose (Delivered / Prescribed): 30 Gy / 30 Gy Prescribed Fxs (Delivered / Prescribed): 10 / 10     ==========ON TREATMENT VISIT DATES========== 2022-12-13, 2022-12-21, 2022-12-27     ==========UPCOMING VISITS==========       ==========APPENDIX - ON TREATMENT VISIT NOTES==========   See weekly On Treatment Notes in Epic for details in the Media tab (listed as Progress notes on the On Treatment Visit Dates listed above).

## 2023-01-02 ENCOUNTER — Encounter: Payer: Self-pay | Admitting: Oncology

## 2023-01-12 ENCOUNTER — Other Ambulatory Visit: Payer: Self-pay | Admitting: Oncology

## 2023-01-12 DIAGNOSIS — C7951 Secondary malignant neoplasm of bone: Secondary | ICD-10-CM

## 2023-01-13 ENCOUNTER — Inpatient Hospital Stay (HOSPITAL_BASED_OUTPATIENT_CLINIC_OR_DEPARTMENT_OTHER): Payer: Medicare Other | Admitting: Oncology

## 2023-01-13 ENCOUNTER — Inpatient Hospital Stay: Payer: Medicare Other

## 2023-01-13 ENCOUNTER — Encounter: Payer: Self-pay | Admitting: Oncology

## 2023-01-13 VITALS — BP 122/62 | HR 81 | Temp 97.5°F | Resp 18 | Ht 64.5 in | Wt 135.0 lb

## 2023-01-13 DIAGNOSIS — C7951 Secondary malignant neoplasm of bone: Secondary | ICD-10-CM

## 2023-01-13 DIAGNOSIS — C50411 Malignant neoplasm of upper-outer quadrant of right female breast: Secondary | ICD-10-CM

## 2023-01-13 DIAGNOSIS — Z170 Estrogen receptor positive status [ER+]: Secondary | ICD-10-CM | POA: Diagnosis not present

## 2023-01-13 DIAGNOSIS — D649 Anemia, unspecified: Secondary | ICD-10-CM | POA: Diagnosis not present

## 2023-01-13 LAB — CBC WITH DIFFERENTIAL (CANCER CENTER ONLY)
Abs Immature Granulocytes: 0.04 10*3/uL (ref 0.00–0.07)
Basophils Absolute: 0.1 10*3/uL (ref 0.0–0.1)
Basophils Relative: 1 %
Eosinophils Absolute: 0.1 10*3/uL (ref 0.0–0.5)
Eosinophils Relative: 2 %
HCT: 32.1 % — ABNORMAL LOW (ref 36.0–46.0)
Hemoglobin: 10.4 g/dL — ABNORMAL LOW (ref 12.0–15.0)
Immature Granulocytes: 1 %
Lymphocytes Relative: 10 %
Lymphs Abs: 0.5 10*3/uL — ABNORMAL LOW (ref 0.7–4.0)
MCH: 30.7 pg (ref 26.0–34.0)
MCHC: 32.4 g/dL (ref 30.0–36.0)
MCV: 94.7 fL (ref 80.0–100.0)
Monocytes Absolute: 0.4 10*3/uL (ref 0.1–1.0)
Monocytes Relative: 8 %
Neutro Abs: 3.8 10*3/uL (ref 1.7–7.7)
Neutrophils Relative %: 78 %
Platelet Count: 198 10*3/uL (ref 150–400)
RBC: 3.39 MIL/uL — ABNORMAL LOW (ref 3.87–5.11)
RDW: 14.6 % (ref 11.5–15.5)
WBC Count: 4.9 10*3/uL (ref 4.0–10.5)
nRBC: 0 % (ref 0.0–0.2)
nRBC: 0 /100{WBCs}

## 2023-01-13 LAB — CMP (CANCER CENTER ONLY)
ALT: 6 U/L (ref 0–44)
AST: 26 U/L (ref 15–41)
Albumin: 3.8 g/dL (ref 3.5–5.0)
Alkaline Phosphatase: 126 U/L (ref 38–126)
Anion gap: 10 (ref 5–15)
BUN: 12 mg/dL (ref 8–23)
CO2: 27 mmol/L (ref 22–32)
Calcium: 10.7 mg/dL — ABNORMAL HIGH (ref 8.9–10.3)
Chloride: 102 mmol/L (ref 98–111)
Creatinine: 0.8 mg/dL (ref 0.44–1.00)
GFR, Estimated: >60 mL/min (ref >60)
Glucose, Bld: 117 mg/dL — ABNORMAL HIGH (ref 70–99)
Potassium: 4 mmol/L (ref 3.5–5.1)
Sodium: 139 mmol/L (ref 135–145)
Total Bilirubin: 1.3 mg/dL — ABNORMAL HIGH (ref <1.2)
Total Protein: 6.7 g/dL (ref 6.5–8.1)

## 2023-01-13 LAB — VITAMIN B12: Vitamin B-12: 501 pg/mL (ref 180–914)

## 2023-01-13 LAB — CEA (ACCESS): CEA (CHCC): 10.01 ng/mL — ABNORMAL HIGH (ref 0.00–5.00)

## 2023-01-13 NOTE — Progress Notes (Incomplete)
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  She is status post neoadjuvant trastuzumab/pertuzumab/paclitaxel.  She had an excellent response to this, with just a tiny residual invasive carcinoma. She received 4 cycles of adjuvant Kadcyla but this was discontinued due to toxicities. She went on to have single agent trastuzumab to complete 1 year of HER2 targeted therapy. Right diagnostic mammogram in June did not reveal any evidence of malignancy.  Benign postlumpectomy changes.  Benign, stable, biopsied right breast asymmetry.  She is scheduled for bilateral diagnostic mammogram in December and to see Dr. Lequita Halt after that. She does not have evidence of local recurrence but multiple symptoms concerning for possible metastatic disease. Her MRI scans are suspicious for recurrent breast cancer.  PET scan images with her, for which she could see her multiple areas of metastatic bone disease.  Hurts all over Starting to loose right leg.   Having trouble turning over on the  Can tolerate the   Right arm weakness New weakness and pain of her right arm, consistent with cervical radiculopathy. Due to her history of HER2 receptor positive breast cancer, she is at increased risk for metastatic disease. CT of thoracic spine on 11/01/2022 showed there is likely evidence of epidural extension of tumor at the T7 level with likely at least mild spinal canal narrowing/. I will therefore refer her to radiation oncology.     Word finding difficulty Difficulty with word finding, in addition to the right arm weakness. Not oriented to year. She has a prior history of  left sided stroke. Also, concerning for possible metastatic disease. MRI of the brain done on 11/01/2022 shows no acute intracranial abnormality and moderate to severe chronic small vessel ischemic disease with multiple chronic infarctions.   Upper back pain New onset upper back pain. Due to her HER2 receptor positive breast cancer. MRI of the thoracic spine done on 11/01/2022 revealed multiple T2 hyperintense lesions throughout the thoracic spine, concerning for metastatic disease, there is likely evidence of epidural extension of tumor at the T7 level with likely at least mild spinal canal narrowing, and non-specific T2 hyperintense lesion in the posterior right hepatic lobe measuring 6mm, so she has a possible live metastasis as well.   Anemia Mild anemia. On 10/27/2022 her vitamin B-12, folate, ferritin, iron, and TIBC were all normal.    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  Plan:   After her palliative radiation is completed, the patient ultimately needs to decide whether wishes to undergo additional palliative therapy to keep her metastatic disease under a decent semblance of control.  If she chooses to do this, I would theoretically recommend that a biopsy of 1 of these metastatic areas be done to verify that it represents the same HER2 positive breast cancer she has dealt with previously.  From there, some form of palliative treatment plan could be established.  Our office will see this patient back in approximately 3 weeks to see how well she has tolerated her palliative radiation, as well as to discuss what she wishes to to do next for her palliative disease management.  With respect to her pain management, she claims Tramadol  was too strong for her; she has been getting by with Tylenol 650 mg three times daily.  She knows to contact our office if her pain becomes more prominent to where a change needs to be made.  The patient and  her family understand all the plans discussed today and are in agreement with them.  I will prescribe Tramadol 50mg  every 6 hours prn. She had a MRI of the cervical spine done on 11/01/2022 that revealed sub-centimeter T1 hypointense, stir hyperintense lesion in the C6 vertebral body suspicious for metastatic disease, multiple degenerative change throughout the cervical spine resulting in up to mild to moderate spinal canal stenosis at C3-C4 and C4-C5, and moderate right neural foraminal stenosis at C6-C7 with degenerative disc disease most advanced at C6-C7. MRI of the thoracic spine done on the same day that revealed multiple T2 hyperintense lesions throughout the thoracic spine, concerning for metastatic disease.  There is likely evidence of epidural extension of tumor at the T7 level with at least mild spinal canal narrowing, and non-specific T2 hyperintense lesion in the posterior right hepatic lobe measuring 6 mm. MRI of the head also done on 11/01/2022 revealed no acute intracranial abnormality and moderate to severe chronic small vessel ischemic disease with multiple chronic infarctions. Since her original cancer was HER2+, I informed her that this would also likely be HER2+, and there are multiple treatments that she can try even going back to Herceptin. I recommended she have an MRI with contrast for further evaluation. I will schedule a MRI of the T-spine and LS-spine with contrast. I will also schedule a PET to complete staging since there is a suspicious lesion in the liver as well. I will schedule a consultation with Dr. Thersa Salt. She had labs done on 10/26/2022. I will schedule an echocardiogram and she may need to have a biopsy done after the PET scan. Her WBC is 5.1, hemoglobin dropped from 12.0 to 11.2, and platelet count of 172,000 and her calcium was elevated at 10.4, and total bilirubin of 1.5. On 10/27/2022 her vitamin B-12, folate, ferritin, iron, and TIBC were all normal. I will see her back  in 2-3 weeks, after tests are done with CBC, CMP,  CEA, CA27.29, SPEP, PT, and PTT. The patient and her daughter understand the plans discussed today and is in agreement with them.  She knows to contact our office if she develops concerns prior to her next appointment.   I provided 23 minutes of face-to-face time during this this encounter and > 50% was spent counseling as documented under my assessment and plan.    [facility] AT [facility] [address] [name] [address] Dept: [phone] Dept Fax: [phone]   No orders of the defined types were placed in this encounter.   CHIEF COMPLAINT:  CC: Stage IB HER2 receptor positive breast cancer  Current Treatment:  Observation  HISTORY OF PRESENT ILLNESS:   Oncology History  Malignant neoplasm of upper-outer quadrant of right breast in female, estrogen receptor positive (HCC)  12/18/2019 Cancer Staging   Staging form: Breast, AJCC 8th Edition - Clinical stage from 12/18/2019: Stage IB (cT2, cN0, cM0, G3, ER+, PR+, HER2+) - Signed by [name], MD on 01/19/2020   01/08/2020 Initial Diagnosis   Breast cancer, right (HCC)   01/28/2020 - 03/26/2020 Chemotherapy    Patient is on Treatment Plan: BREAST TRASTUZUMAB + PERTUZUMAB Q21D X 11 CYCLES  06/01/2020 - 06/01/2020 Chemotherapy         06/17/2020 - 07/29/2020 Chemotherapy         09/17/2020 - 04/16/2021 Chemotherapy   Patient is on Treatment Plan : BREAST Trastuzumab q21d X 11 Cycles         INTERVAL HISTORY:  The patient comes in today to go over her PET scan images and their implications.  The patient claims to still have significant right arm pain to where her range of motion has been limited.  Her back pain has also somewhat impacted her ambulation. Understandably, her family is concerned she has metastatic disease.    Brandy Jacobs is here today for repeat clinical assessment stage IB HER2  receptor positive breast cancer. Patient states that she feels ok but complains of right arm pain and a pain behind her right breast that has persisted for several months. She informed me that her arms are weak to the point where she cannot lift them. She takes tylenol for pain relief occasionally. I will prescribe  Tramadol 50mg  Q6hrs prn. She had a MRI of the cervical spine done on 11/01/2022 that revealed sub-centimeter T1 hypointense, stir hyperintense lesion in the C6 vertebral body suspicious for metastatic disease or multiple myeloma, multiple degenerative change throughout the cervical spine resulting in up to mild to moderate spinal canal stenosis at C3-C4 and C4-C5, and moderate right neural foraminal stenosis at C6-C7 with degenerative disc disease most advanced at C6-C7. MRI of the thoracic spine done on the same day that revealed multiple T2 hyperintense lesions throughout the thoracic spine, concerning for metastatic disease, there is likely evidence of epidural extension of tumor at the T7 level with at least mild spinal canal narrowing, and non-specific T2 hyperintense lesion in the posterior right hepatic lobe measuring 6 mm. MRI of the head also done on 11/01/2022 revealed no acute intracranial abnormality and moderate to severe chronic small vessel ischemic disease with multiple chronic infarctions. Since her original cancer was HER2+, I informed her that this would also likely be HER2+, and there are multiple treatments that she can try even going back to Herceptin. I recommended she have a MRI with contrast for further evaluation. I will schedule a MRI of the T-spine and LS-spine with contrast. I will also schedule a PET to complete staging since there is a suspicious lesion in the liver as well. I will schedule a consultation with Dr. Thersa Salt. She had labs done on 10/26/2022. I will schedule her a echocardiogram and she may need to have a biopsy done after the PET scan. Her WBC is 5.1,  hemoglobin dropped from 12.0 to 11.2, and platelet count of 172,000 and her calcium was elevated at 10.4, and total bilirubin of 1.5. On 10/27/2022 her vitamin B-12, folate, ferritin, iron, and TIBC were all normal. I will see her back in 2 weeks after tests are done with CBC, CMP, CEA, CA27.29, SPEP, PT, and PTT. She denies signs of infection such as sore throat, sinus drainage, cough, or urinary symptoms.  She denies fevers or recurrent chills. She denies pain. She denies nausea, vomiting, chest pain, dyspnea or cough. Her appetite is good and her weight has increased 7 pounds over last 7 months .   Exam: 11/01/2022 MRI of Cervical Spine without Contrast Impression: Sub-centimeter T1 hypointense, stir hyperintense lesion in the C6 vertebral body suspicious for metastatic disease or multiple myeloma.  Multiple degenerative change throughout the cervical spine resulting in up to mild to moderate spinal canal stenosis at  C3-C4 and C4-C5, and moderate right neural foraminal stenosis at C6-C7 with degenerative disc disease most advanced at C6-C7.  Thoracic spine MRI reported separately.    Exam: 11/01/2022 MRI of Thoracic Spine without Contrast Impression:  multiple T2 hyperintense lesions throughout the thoracic spine, as above, concerning for metastatic disease. There is likely evidence of epidural extension of tumor at the T7 level with likely at least mild spinal canal narrowing. Recommend contrast enhanced MRI of the thoracic spine for further evaluation.  Non-specific T2 hyperintense lesion in the posterior right hepatic lobe measuring 6mm. Recommend further evaluation with dedicated abdominal MRI with and without contrast.    Exam: 11/01/2022 MRI Head without Contrast Impression: No acute intracranial abnormality. Moderate to severe chronic small vessel ischemic disease with multiple chronic infarcts as above.   REVIEW OF SYSTEMS:  Review of Systems  Constitutional:  Positive for  fatigue. Negative for appetite change, chills, diaphoresis, fever and unexpected weight change.  HENT:  Negative.  Negative for hearing loss, lump/mass, mouth sores, nosebleeds, sore throat, tinnitus, trouble swallowing and voice change.   Eyes: Negative.  Negative for eye problems and icterus.  Respiratory: Negative.  Negative for chest tightness, cough, hemoptysis, shortness of breath and wheezing.   Cardiovascular: Negative.  Negative for chest pain, leg swelling and palpitations.  Gastrointestinal: Negative.  Negative for abdominal distention, abdominal pain, blood in stool, constipation, diarrhea, nausea, rectal pain and vomiting.  Endocrine: Negative.   Genitourinary: Negative.  Negative for bladder incontinence, difficulty urinating, dyspareunia, dysuria, frequency, hematuria, menstrual problem, nocturia, pelvic pain, vaginal bleeding and vaginal discharge.   Musculoskeletal:  Positive for back pain, gait problem (uses walker) and neck pain. Negative for arthralgias, flank pain, myalgias and neck stiffness.       Right arm and shoulder pain with decreased ability to use the right upper extremity  Skin: Negative.  Negative for itching, rash and wound.  Neurological:  Positive for extremity weakness (right upper extremity) and gait problem (uses walker). Negative for dizziness, headaches, light-headedness, numbness, seizures and speech difficulty.  Hematological: Negative.  Negative for adenopathy. Does not bruise/bleed easily.  Psychiatric/Behavioral: Negative.  Negative for confusion, decreased concentration, depression, sleep disturbance and suicidal ideas. The patient is not nervous/anxious.    Past Surgical History:  Procedure Laterality Date   BIOPSY THYROID     benign   BREAST BIOPSY     BREAST LUMPECTOMY W/ NEEDLE LOCALIZATION Right 05/04/2020     VITALS:  There were no vitals taken for this visit.  Wt Readings from Last 3 Encounters:  12/21/22 140 lb 6.4 oz (63.7 kg)   11/10/22 141 lb 1.6 oz (64 kg)  10/26/22 139 lb 9.6 oz (63.3 kg)    There is no height or weight on file to calculate BMI.  Performance status (ECOG): 2 - Symptomatic, <50% confined to bed  PHYSICAL EXAM:  Physical Exam Vitals and nursing note reviewed.  Constitutional:      General: She is not in acute distress.    Appearance: Normal appearance. She is normal weight. She is ill-appearing (chronically ill appearing). She is not toxic-appearing or diaphoretic.  HENT:     Head: Normocephalic and atraumatic.     Right Ear: Tympanic membrane, ear canal and external ear normal. There is no impacted cerumen.     Left Ear: Tympanic membrane, ear canal and external ear normal. There is no impacted cerumen.     Nose: Nose normal. No congestion or rhinorrhea.     Mouth/Throat:  Mouth: Mucous membranes are moist.     Pharynx: Oropharynx is clear. No oropharyngeal exudate or posterior oropharyngeal erythema.  Eyes:     General: No scleral icterus.       Right eye: No discharge.        Left eye: No discharge.     Extraocular Movements: Extraocular movements intact.     Conjunctiva/sclera: Conjunctivae normal.     Pupils: Pupils are equal, round, and reactive to light.  Neck:     Vascular: No carotid bruit.  Cardiovascular:     Rate and Rhythm: Normal rate and regular rhythm.     Pulses: Normal pulses.     Heart sounds: Normal heart sounds. No murmur heard.    No friction rub. No gallop.  Pulmonary:     Effort: Pulmonary effort is normal. No respiratory distress.     Breath sounds: Normal breath sounds. No stridor. No wheezing, rhonchi or rales.  Chest:     Chest wall: No tenderness.  Breasts:    Right: Normal.     Left: Normal.  Abdominal:     General: Bowel sounds are normal. There is no distension.     Palpations: Abdomen is soft. There is no mass.     Tenderness: There is no abdominal tenderness. There is no right CVA tenderness, left CVA tenderness, guarding or rebound.      Hernia: No hernia is present.  Musculoskeletal:        General: No swelling, tenderness, deformity or signs of injury. Normal range of motion.     Cervical back: Normal range of motion and neck supple. No rigidity or tenderness.     Right lower leg: No edema.     Left lower leg: No edema.  Lymphadenopathy:     Cervical: No cervical adenopathy.     Upper Body:     Right upper body: No supraclavicular or axillary adenopathy.     Left upper body: No supraclavicular or axillary adenopathy.  Skin:    General: Skin is warm and dry.     Coloration: Skin is not jaundiced or pale.     Findings: No bruising, erythema, lesion or rash.  Neurological:     General: No focal deficit present.     Mental Status: She is alert and oriented to person, place, and time. Mental status is at baseline.     Cranial Nerves: No cranial nerve deficit.     Sensory: Sensation is intact. No sensory deficit.     Motor: Weakness (right upper extremity) present.     Coordination: Coordination normal.     Gait: Gait normal.     Deep Tendon Reflexes: Reflexes normal.     Comments: She is oriented to person, place, month, day and date, but not year. Motor strength is 3+/5 right upper extremity with 4/5 grip. She uses her left arm to lift her right arm. She denies dropping items with the right hand. When she tries to describe how she manages at home she cannot think of the word for end table and recliner.  Psychiatric:        Mood and Affect: Mood normal.        Behavior: Behavior normal.        Thought Content: Thought content normal.        Judgment: Judgment normal.    LABS:      Latest Ref Rng & Units 12/21/2022    2:27 PM 12/01/2022    2:47 PM 10/26/2022  1:01 PM  CBC  WBC 4.0 - 10.5 K/uL 5.6  5.0  5.1   Hemoglobin 12.0 - 15.0 g/dL 04.5  40.9  81.1   Hematocrit 36.0 - 46.0 % 34.5  34.8  36.7   Platelets 150 - 400 K/uL 178  195  172       Latest Ref Rng & Units 12/21/2022    2:27 PM 12/01/2022    2:47 PM  10/26/2022    1:01 PM  CMP  Glucose 70 - 99 mg/dL 93  92  914   BUN 8 - 23 mg/dL 14  15  16    Creatinine 0.44 - 1.00 mg/dL 7.82  9.56  2.13   Sodium 135 - 145 mmol/L 139  143  140   Potassium 3.5 - 5.1 mmol/L 4.3  3.8  4.1   Chloride 98 - 111 mmol/L 103  106  105   CO2 22 - 32 mmol/L 28  28  27    Calcium 8.9 - 10.3 mg/dL 08.6  57.8  46.9   Total Protein 6.5 - 8.1 g/dL 6.8  6.7  7.1   Total Bilirubin <1.2 mg/dL 1.5  1.3  1.4   Alkaline Phos 38 - 126 U/L 111  107  77   AST 15 - 41 U/L 24  22  22    ALT 0 - 44 U/L 8  6  14     Component Ref Range & Units 10/27/2022   Vitamin B-12 180 - 914 pg/mL 234   Component Ref Range & Units 10/27/2022  Folate >5.9 ng/mL 16.9   Lab Results  Component Value Date   CEA 12.09 (H) 12/01/2022   /  CEA ([facility])  Date Value Ref Range Status  12/01/2022 12.09 (H) 0.00 - 5.00 ng/mL Final    Comment:    (NOTE) This test was performed using Beckman Coulter's paramagnetic chemiluminescent immunoassay. Values obtained from different assay methods cannot be used interchangeably. Please note that up to 8% of patients who smoke may see values 5.1-10.0 ng/ml and 1% of patients who smoke may see CEA levels >10.0 ng/ml. Performed at [facility], [address], [place], [name] [address]    No results found for: "PSA1" No results found for: "WUX324" No results found for: "CAN125"  Lab Results  Component Value Date   TOTALPROTELP 6.5 12/01/2022   ALBUMINELP 3.9 12/01/2022   A1GS 0.3 12/01/2022   A2GS 0.7 12/01/2022   BETS 0.9 12/01/2022   GAMS 0.8 12/01/2022   MSPIKE Not Observed 12/01/2022   SPEI Comment 12/01/2022   Lab Results  Component Value Date   TIBC 298 10/27/2022   FERRITIN 96 10/27/2022   IRONPCTSAT 24 10/27/2022   No results found for: "LDH"  STUDIES:  Her PET scan on 11-23-22 revealed the following:  FINDINGS: Mediastinal blood pool activity: SUV max 2.40  Liver activity: SUV max NA  NECK: No  hypermetabolic lymph nodes in the neck. Focus of hypermetabolism near the left tonsillar/tongue base lesion on the left. SUV max is 5.53. There is some soft tissue thickening in this area. Neoplasm versus infection. Recommend direct visualization.  Incidental CT findings: None.  CHEST: Small hypermetabolic focus in the right lateral breast has an SUV max of 5.86 and consistent with small focus of breast cancer. No enlarged or hypermetabolic supraclavicular or axillary lymph nodes. No mediastinal or hilar adenopathy.  Incidental CT findings: Stable advanced vascular disease. No pericardial effusion. No worrisome pulmonary nodules to suggest pulmonary metastatic disease.  ABDOMEN/PELVIS:  No abnormal hypermetabolic activity within the liver, pancreas, adrenal glands, or spleen. No hypermetabolic lymph nodes in the abdomen or pelvis.  Scattered hypermetabolic foci in the colon without CT correlate. This is likely physiologic or inflammatory. Recommend correlation with colon cancer screening history.  Incidental CT findings: Diffuse colonic diverticulosis most severe in the sigmoid colon. Large calcified uterine fibroid noted. Advanced vascular disease without aneurysm. Mild cystocele noted.  SKELETON: Diffuse hypermetabolic skeletal metastasis. This involves the axial and appendicular skeleton.  Index lesions:  Right humerus has SUV max of 13.29. Clear destructive changes involving the cortex.  Anterior first right rib has SUV max of 8.91.  Left fourth posterolateral rib has an SUV max of 13.99 with advanced destructive bony changes.  T7 vertebral body has an SUV max of 11.61. Destructive bony changes on the CT scan.  Incidental CT findings: None.  IMPRESSION: 1. Small hypermetabolic focus in the right lateral breast consistent with small focus of breast cancer. 2. Diffuse hypermetabolic skeletal metastasis involving the axial and appendicular skeleton. Associated  pathologic fractures. 3. No findings for soft tissue metastatic disease. 4. Focus of hypermetabolism near the left tonsillar/tongue base lesion on the left. There is some soft tissue thickening in this area. Neoplasm versus infection. Recommend direct visualization. 5. Scattered hypermetabolic foci in the colon without CT correlate. This is likely physiologic or inflammatory. Recommend correlation with colon cancer screening history.  HISTORY:   Past Medical History:  Diagnosis Date   Anemia 10/26/2022   Arthritis    osteoarthritis   Breast cancer (HCC)    Diabetes mellitus without complication (HCC)    not on medication   History of CVA (cerebrovascular accident)    Hypertension    Multiple thyroid nodules    benign   Right arm weakness 10/26/2022   Upper back pain 10/26/2022   Word finding difficulty 10/26/2022      Family History  Problem Relation Age of Onset   Colon cancer Father        19s   Breast cancer Sister        44-60   Colon cancer Brother        51s   Prostate cancer Brother        late 52s   Breast cancer Half-Sister        late 15s early 11s    Social History:  reports that she has never smoked. She has never used smokeless tobacco. She reports that she does not currently use alcohol. She reports that she does not use drugs.The patient is accompanied by her daughter today.  Allergies: No Known Allergies  Current Medications: Current Outpatient Medications  Medication Sig Dispense Refill   acetaminophen (TYLENOL) 500 MG tablet Take 500 mg by mouth at bedtime.     aspirin EC 81 MG tablet Take 81 mg by mouth daily. Swallow whole.     atorvastatin (LIPITOR) 40 MG tablet Take 40 mg by mouth daily.     cyanocobalamin (VITAMIN B12) 500 MCG tablet Take 1 tablet (500 mcg total) by mouth daily.     letrozole (FEMARA) 2.5 MG tablet Take 1 tablet (2.5 mg total) by mouth daily. 90 tablet 1   losartan (COZAAR) 50 MG tablet Take 50 mg by mouth daily.      ondansetron (ZOFRAN-ODT) 4 MG disintegrating tablet Take 1 tablet (4 mg total) by mouth every 6 (six) hours as needed for nausea or vomiting. 60 tablet 3   traMADol (ULTRAM) 50 MG tablet Take 1 tablet (  50 mg total) by mouth every 6 (six) hours as needed. 90 tablet 0   triamcinolone cream (KENALOG) 0.1 % SMARTSIG:1 Application Topical 2-3 Times Daily     Vitamin D3 (VITAMIN D) 25 MCG tablet Take 1,000 Units by mouth daily.     No current facility-administered medications for this visit.    I,Jasmine M Lassiter,acting as a scribe for [name], MD.,have documented all relevant documentation on the behalf of [name], MD,as directed by  [name], MD while in the presence of [name], MD.

## 2023-01-14 LAB — CANCER ANTIGEN 27.29: CA 27.29: 117.2 U/mL — ABNORMAL HIGH (ref 0.0–38.6)

## 2023-01-19 ENCOUNTER — Encounter: Payer: Self-pay | Admitting: Oncology

## 2023-01-19 ENCOUNTER — Other Ambulatory Visit: Payer: Self-pay | Admitting: Oncology

## 2023-01-19 DIAGNOSIS — C7951 Secondary malignant neoplasm of bone: Secondary | ICD-10-CM

## 2023-01-19 NOTE — Progress Notes (Signed)
[facility]  [address] 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  She is status post neoadjuvant trastuzumab/pertuzumab/paclitaxel.  She had an excellent response to this, with just a tiny residual invasive carcinoma. She received 4 cycles of adjuvant Kadcyla but this was discontinued due to toxicities. She went on to have single agent trastuzumab to complete 1 year of HER2 targeted therapy. Right diagnostic mammogram in June did not reveal any evidence of malignancy.  Benign postlumpectomy changes.  Benign, stable, biopsied right breast asymmetry.  She is scheduled for bilateral diagnostic mammogram in December and to see Dr. Lequita Halt after that. She does not have evidence of local recurrence but multiple symptoms concerning for possible metastatic disease. Her MRI scans are suspicious for recurrent breast cancer.  Pet Scan revealed  multiple areas of metastatic bone disease. Had MRI thoracic imaging on 11/28/2022 which revealed excessive osseous metastasis seen throughout the thoracic and lumbar spine.  Completed palliative radiation to right right shoulder and T-spine on 12/28/2022 with Dr. Thersa Salt.  Right arm weakness New weakness and pain of her right arm, consistent with cervical radiculopathy. Due to her history of HER2 receptor positive breast cancer, she is at increased risk for metastatic disease. CT of thoracic spine on 11/01/2022 showed there is likely evidence of epidural extension of tumor at the T7 level with likely at least mild spinal canal narrowing.   Word finding difficulty Difficulty with word finding, in addition to the right arm weakness. Not oriented to  year. She has a prior history of left sided stroke. Also, concerning for possible metastatic disease. MRI of the brain done on 11/01/2022 shows no acute intracranial abnormality and moderate to severe chronic small vessel ischemic disease with multiple chronic infarctions.   Upper back pain Due to her HER2 receptor positive breast cancer. MRI of the thoracic spine done on 11/01/2022 multiple T2 hyperintense lesions throughout the thoracic spine, concerning for metastatic disease, there is likely evidence of epidural extension of tumor at the T7 level with likely at least mild spinal canal narrowing, and non-specific T2 hyperintense lesion in the posterior right hepatic lobe measuring 6mm, so she has a possible live metastasis as well.   Anemia Mild anemia. On 10/27/2022 her vitamin B-12, folate, ferritin, iron, and TIBC were all normal.    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  Right hip pain This is a new problem.  Plan: She completed palliative radiation on 12/28/2022 with Dr. Thersa Salt to T-spine and right shoulder.  She tolerated treatment well but has not noticed any significant improvement of her pain.  Reports new onset right hip pain.  Having trouble walking.  Reports Tylenol was not helping and she is now taking tramadol50 mg every 4-5 hours which is helping but makes her tired.  She had labs drawn on 01/13/2023 which revealed mostly stable CBC with hemoglobin 10.4.  CEA is elevated at 10.01 (12.09) and CA 27.29 elevated at 117.2.  CMP reveals elevated calcium at 10.7 (10.8) and elevated bilirubin 1.3.  Reviewed notes from Dr. Lavonda Jumbo previous encounter with her which was to discuss if she is interested in additional treatment moving forward versus palliative/comfort care and to assess tolerance of radiation.  Daughter would like to discuss available treatment options for her mom but would like to discuss them with Dr. Gilman Buttner.  We  briefly discussed possible options including Herceptin which I attached to her after visit summary for her to review.  Reports patient was never told to start letrozole 2.5 mg daily.   I spent 20 minutes dedicated to the care of this patient (face-to-face and non-face-to-face) on the date of the encounter to include what is described in the assessment and plan.   [name], NP 01/19/2023 8:16 AM  No orders of the defined types were placed in this encounter.   CHIEF COMPLAINT:  CC: Stage IB HER2 receptor positive breast cancer  Current Treatment: Observation  HISTORY OF PRESENT ILLNESS:   Oncology History  Malignant neoplasm of upper-outer quadrant of right breast in female, estrogen receptor positive (HCC)  12/18/2019 Cancer Staging   Staging form: Breast, AJCC 8th Edition - Clinical stage from 12/18/2019: Stage IB (cT2, cN0, cM0, G3, ER+, PR+, HER2+) - Signed by [name], MD on 01/19/2020   01/08/2020 Initial Diagnosis   Breast cancer, right (HCC)   01/28/2020 - 03/26/2020 Chemotherapy    Patient is on Treatment Plan: BREAST TRASTUZUMAB + PERTUZUMAB Q21D X 11 CYCLES      06/01/2020 - 06/01/2020 Chemotherapy         06/17/2020 - 07/29/2020 Chemotherapy         09/17/2020 - 04/16/2021 Chemotherapy   Patient is on Treatment Plan : BREAST Trastuzumab q21d X 11 Cycles         INTERVAL HISTORY:  The patient comes in today for follow-up after completing palliative radiation to T-spine and right shoulder with Dr. Thersa Salt on 12/28/2022.  She was seen by Dr. Angelene Giovanni on 12/28/2022 for follow-up.  Per his note, she was supposed to start letrozole 2.5 mg daily.  Reports no improvement of her pain following radiation.  She continues to have significant pain in her back and right shoulder and now has almost lost the use of her right leg.  She is taking tramadol 50 mg every 4-6 hours with mostly relief of her pain although it is causing her to feel tired all the time.  She  is here with her daughter today.  They were both hoping to discuss plan moving forward with Dr. Gilman Buttner.  They both deny starting letrozole per Dr. Angelene Giovanni  previous note.  They are unsure of what I am speaking about.  They are interested in discussing treatments moving forward and what they would entail.  She denies any nausea or vomiting, diarrhea or constipation.  Denies any new infections.   She denies signs of infection such as sore throat, sinus drainage, cough, or urinary symptoms.  She denies fevers or recurrent chills. She denies pain. She denies nausea, vomiting, chest pain, dyspnea or cough. Her appetite is fair and her weight is down 5 pounds since her last visit   Exam: 11/01/2022 MRI of Cervical Spine without Contrast Impression: Sub-centimeter T1 hypointense, stir hyperintense lesion in the C6 vertebral body suspicious for metastatic disease or multiple myeloma.  Multiple degenerative change throughout the cervical spine resulting in up to mild to moderate spinal canal stenosis at C3-C4 and C4-C5, and moderate right neural foraminal stenosis at C6-C7 with degenerative disc disease most advanced at C6-C7.  Thoracic spine MRI reported separately.    Exam: 11/01/2022 MRI of Thoracic Spine without Contrast Impression:  multiple T2 hyperintense lesions throughout the thoracic spine, as above, concerning for metastatic  disease. There is likely evidence of epidural extension of tumor at the T7 level with likely at least mild spinal canal narrowing. Recommend contrast enhanced MRI of the thoracic spine for further evaluation.  Non-specific T2 hyperintense lesion in the posterior right hepatic lobe measuring 6mm. Recommend further evaluation with dedicated abdominal MRI with and without contrast.    Exam: 11/01/2022 MRI Head without Contrast Impression: No acute intracranial abnormality. Moderate to severe chronic small vessel ischemic disease with multiple chronic infarcts as above.    REVIEW OF SYSTEMS:  Review of Systems  Constitutional:  Positive for fatigue and unexpected weight change. Negative for fever.  Respiratory:  Negative for cough and shortness of breath.   Gastrointestinal:  Negative for abdominal distention, abdominal pain, constipation, diarrhea, nausea and vomiting.  Musculoskeletal:  Positive for arthralgias, back pain and gait problem. Negative for myalgias.  Neurological:  Positive for extremity weakness and gait problem.   Past Surgical History:  Procedure Laterality Date   BIOPSY THYROID     benign   BREAST BIOPSY     BREAST LUMPECTOMY W/ NEEDLE LOCALIZATION Right 05/04/2020     VITALS:  Blood pressure 122/62, pulse 81, temperature (!) 97.5 F (36.4 C), temperature source Oral, resp. rate 18, height 5' 4.5" (1.638 m), weight 135 lb (61.2 kg), SpO2 99%.  Wt Readings from Last 3 Encounters:  01/13/23 135 lb (61.2 kg)  12/21/22 140 lb 6.4 oz (63.7 kg)  11/10/22 141 lb 1.6 oz (64 kg)    Body mass index is 22.81 kg/m.  Performance status (ECOG): 2 - Symptomatic, <50% confined to bed  PHYSICAL EXAM:  Physical Exam Constitutional:      Appearance: She is well-developed.  HENT:     Head: Normocephalic and atraumatic.  Eyes:     Pupils: Pupils are equal, round, and reactive to light.  Cardiovascular:     Rate and Rhythm: Normal rate and regular rhythm.     Heart sounds: No murmur heard. Pulmonary:     Effort: Pulmonary effort is normal.     Breath sounds: Normal breath sounds. No wheezing.  Abdominal:     General: Bowel sounds are normal. There is no distension.     Palpations: Abdomen is soft. There is no mass.     Tenderness: There is no abdominal tenderness.  Musculoskeletal:        General: Normal range of motion.     Cervical back: Normal range of motion.  Skin:    General: Skin is warm and dry.  Neurological:     Mental Status: She is alert and oriented to person, place, and time.  Psychiatric:        Behavior: Behavior  normal.    LABS:      Latest Ref Rng & Units 01/13/2023    1:54 PM 12/21/2022    2:27 PM 12/01/2022    2:47 PM  CBC  WBC 4.0 - 10.5 K/uL 4.9  5.6  5.0   Hemoglobin 12.0 - 15.0 g/dL 14.7  82.9  56.2   Hematocrit 36.0 - 46.0 % 32.1  34.5  34.8   Platelets 150 - 400 K/uL 198  178  195       Latest Ref Rng & Units 01/13/2023    1:54 PM 12/21/2022    2:27 PM 12/01/2022    2:47 PM  CMP  Glucose 70 - 99 mg/dL 130  93  92   BUN 8 - 23 mg/dL 12  14  15    Creatinine  0.44 - 1.00 mg/dL 1.61  0.96  0.45   Sodium 135 - 145 mmol/L 139  139  143   Potassium 3.5 - 5.1 mmol/L 4.0  4.3  3.8   Chloride 98 - 111 mmol/L 102  103  106   CO2 22 - 32 mmol/L 27  28  28    Calcium 8.9 - 10.3 mg/dL 40.9  81.1  91.4   Total Protein 6.5 - 8.1 g/dL 6.7  6.8  6.7   Total Bilirubin <1.2 mg/dL 1.3  1.5  1.3   Alkaline Phos 38 - 126 U/L 126  111  107   AST 15 - 41 U/L 26  24  22    ALT 0 - 44 U/L 6  8  6     Component Ref Range & Units 10/27/2022   Vitamin B-12 180 - 914 pg/mL 234   Component Ref Range & Units 10/27/2022  Folate >5.9 ng/mL 16.9   Lab Results  Component Value Date   CEA 10.01 (H) 01/13/2023   /  CEA ([facility])  Date Value Ref Range Status  01/13/2023 10.01 (H) 0.00 - 5.00 ng/mL Final    Comment:    (NOTE) This test was performed using Beck[employer] Coulter's paramagnetic chemiluminescent immunoassay. Values obtained from different assay methods cannot be used interchangeably. Please note that up to 8% of patients who smoke may see values 5.1-10.0 ng/ml and 1% of patients who smoke may see CEA levels >10.0 ng/ml. Performed at [facility], [address], [place], [name] [address]    No results found for: "PSA1" No results found for: "AOZ308" No results found for: "CAN125"  Lab Results  Component Value Date   TOTALPROTELP 6.5 12/01/2022   ALBUMINELP 3.9 12/01/2022   A1GS 0.3 12/01/2022   A2GS 0.7 12/01/2022   BETS 0.9 12/01/2022   GAMS 0.8 12/01/2022    MSPIKE Not Observed 12/01/2022   SPEI Comment 12/01/2022   Lab Results  Component Value Date   TIBC 298 10/27/2022   FERRITIN 96 10/27/2022   IRONPCTSAT 24 10/27/2022   No results found for: "LDH"  STUDIES:  Her PET scan on 11-23-22 revealed the following:  FINDINGS: Mediastinal blood pool activity: SUV max 2.40  Liver activity: SUV max NA  NECK: No hypermetabolic lymph nodes in the neck. Focus of hypermetabolism near the left tonsillar/tongue base lesion on the left. SUV max is 5.53. There is some soft tissue thickening in this area. Neoplasm versus infection. Recommend direct visualization.  Incidental CT findings: None.  CHEST: Small hypermetabolic focus in the right lateral breast has an SUV max of 5.86 and consistent with small focus of breast cancer. No enlarged or hypermetabolic supraclavicular or axillary lymph nodes. No mediastinal or hilar adenopathy.  Incidental CT findings: Stable advanced vascular disease. No pericardial effusion. No worrisome pulmonary nodules to suggest pulmonary metastatic disease.  ABDOMEN/PELVIS: No abnormal hypermetabolic activity within the liver, pancreas, adrenal glands, or spleen. No hypermetabolic lymph nodes in the abdomen or pelvis.  Scattered hypermetabolic foci in the colon without CT correlate. This is likely physiologic or inflammatory. Recommend correlation with colon cancer screening history.  Incidental CT findings: Diffuse colonic diverticulosis most severe in the sigmoid colon. Large calcified uterine fibroid noted. Advanced vascular disease without aneurysm. Mild cystocele noted.  SKELETON: Diffuse hypermetabolic skeletal metastasis. This involves the axial and appendicular skeleton.  Index lesions:  Right humerus has SUV max of 13.29. Clear destructive changes involving the cortex.  Anterior first right rib has SUV max of  8.91.  Left fourth posterolateral rib has an SUV max of 13.99 with advanced destructive  bony changes.  T7 vertebral body has an SUV max of 11.61. Destructive bony changes on the CT scan.  Incidental CT findings: None.  IMPRESSION: 1. Small hypermetabolic focus in the right lateral breast consistent with small focus of breast cancer. 2. Diffuse hypermetabolic skeletal metastasis involving the axial and appendicular skeleton. Associated pathologic fractures. 3. No findings for soft tissue metastatic disease. 4. Focus of hypermetabolism near the left tonsillar/tongue base lesion on the left. There is some soft tissue thickening in this area. Neoplasm versus infection. Recommend direct visualization. 5. Scattered hypermetabolic foci in the colon without CT correlate. This is likely physiologic or inflammatory. Recommend correlation with colon cancer screening history.  HISTORY:   Past Medical History:  Diagnosis Date   Anemia 10/26/2022   Arthritis    osteoarthritis   Breast cancer (HCC)    Diabetes mellitus without complication (HCC)    not on medication   History of CVA (cerebrovascular accident)    Hypertension    Multiple thyroid nodules    benign   Right arm weakness 10/26/2022   Upper back pain 10/26/2022   Word finding difficulty 10/26/2022      Family History  Problem Relation Age of Onset   Colon cancer Father        73s   Breast cancer Sister        45-60   Colon cancer Brother        56s   Prostate cancer Brother        late 46s   Breast cancer Half-Sister        late 50s early 72s    Social History:  reports that she has never smoked. She has never used smokeless tobacco. She reports that she does not currently use alcohol. She reports that she does not use drugs.The patient is accompanied by her daughter today.  Allergies: No Known Allergies  Current Medications: Current Outpatient Medications  Medication Sig Dispense Refill   acetaminophen (TYLENOL) 500 MG tablet Take 500 mg by mouth at bedtime.     aspirin EC 81 MG tablet Take 81  mg by mouth daily. Swallow whole.     atorvastatin (LIPITOR) 40 MG tablet Take 40 mg by mouth daily.     cyanocobalamin (VITAMIN B12) 500 MCG tablet Take 1 tablet (500 mcg total) by mouth daily.     letrozole (FEMARA) 2.5 MG tablet Take 1 tablet (2.5 mg total) by mouth daily. 90 tablet 1   losartan (COZAAR) 50 MG tablet Take 50 mg by mouth daily.     ondansetron (ZOFRAN-ODT) 4 MG disintegrating tablet Take 1 tablet (4 mg total) by mouth every 6 (six) hours as needed for nausea or vomiting. 60 tablet 3   traMADol (ULTRAM) 50 MG tablet Take 1 tablet (50 mg total) by mouth every 6 (six) hours as needed. 90 tablet 0   triamcinolone cream (KENALOG) 0.1 % SMARTSIG:1 Application Topical 2-3 Times Daily     Vitamin D3 (VITAMIN D) 25 MCG tablet Take 1,000 Units by mouth daily.     No current facility-administered medications for this visit.

## 2023-01-19 NOTE — Progress Notes (Deleted)
[facility] Cancer Initial Visit:  Patient Care Team: [name], MD as PCP - General (General Practice) [name], MD as Consulting Physician (Oncology)  CHIEF COMPLAINTS/PURPOSE OF CONSULTATION:  Oncology History  Malignant neoplasm of upper-outer quadrant of right breast in female, estrogen receptor positive (HCC)  12/18/2019 Cancer Staging   Staging form: Breast, AJCC 8th Edition - Clinical stage from 12/18/2019: Stage IB (cT2, cN0, cM0, G3, ER+, PR+, HER2+) - Signed by [name], MD on 01/19/2020   01/08/2020 Initial Diagnosis   Breast cancer, right (HCC)   01/28/2020 - 03/26/2020 Chemotherapy    Patient is on Treatment Plan: BREAST TRASTUZUMAB + PERTUZUMAB Q21D X 11 CYCLES      06/01/2020 - 06/01/2020 Chemotherapy         06/17/2020 - 07/29/2020 Chemotherapy         09/17/2020 - 04/16/2021 Chemotherapy   Patient is on Treatment Plan : BREAST Trastuzumab q21d X 11 Cycles       HISTORY OF PRESENTING ILLNESS: Brandy Jacobs 89 y.o. female is here because of metastatic breast cancer  November 2021:  Diagnosed with Stage IB hormone and HER2 receptor positive breast cancer.  Received neoadjuvant trastuzumab/pertuzumab/paclitaxel with excellent response.  Received 4 cycles of adjuvant Kadcyla but this was discontinued due to toxicities. She went on to have single agent trastuzumab to complete 1 year of HER2 targeted therapy in March 2023  November 01 2022- Presents with word finding difficulties, new onset back pain and weakness of right arm. MRI head - No acute intracranial abnormality. Moderate to severe chronic small vessel ischemic disease with multiple chronic infarcts.  MRI CT spine  Subcentimeter T1 hypointense, stir hyperintense lesion in C6 vertebral body suspicious for metastatic disease.  Multilevel degenerative change throughout the cervical spine resulting spinal canal stenosis at C3-C4, C4-C5, right neural foraminal stenosis at C6-C7.    Disc degeneration most advanced at C6-C7.  Multiple T2 hyperintense lesions throughout the thoracic spine, concerning for metastatic disease. Likely epidural extension of tumor at T7 with  at least mild spinal canal narrowing.   November 14 2022:  Radiation Oncology Consult- Awaiting results of PET/CT  November 23 2022:  PET/CT- Small hypermetabolic focus in the right lateral breast consistent with small focus of breast cancer.  Diffuse hypermetabolic skeletal metastasis involving the axial and appendicular skeleton. Associated pathologic fractures.  No soft tissue metastatic disease.   Cardiac ECHO- LVEF 50-55%  November 28 2022:  MRI T/L spine-  Extensive osseous metastases seen throughout the T/L spine that enhances following contrast.  Degenerative changes.   December 01 2022: WBC 5.0 hemoglobin 11.4 platelet count 195; 62 segs 26 lymphs 7 monos 3 eos 1 basophil.  INR 1.0 PTT 28 SPEP with IP showed no paraprotein CA 27-29 112.  CEA 12.1 CMP notable for T. bili 1.3  December 07, 2022: CT simulation for treatment of thoracic spine and right humerus  December 21 2022:   Received Fraction 7/10 of XRT to T spine and right shoulder.  Here with daughter.  Can't lift right arm at shoulder.  Having pain in right arm and thoracic spine.  Not taking Tramadol because of nausea but takes ibuprofen.  Patient is staying by herself and does not have help in the home.  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  The neuropathy is not painful.  Patient and daughter have talked to our Child psychotherapist.    December 28 2022:  Scheduled follow up for  metastatic breast cancer.  Completed XRT today.  Decided not to take the Morphine as written for at last visit.  She is taking Tramadol 50 mg every 4 to 6 hrs with incomplete relief of pain.  Takes something to help sleep at night- written by PCP.  No nausea.  Will begin Letrazole 2.5 mg daily  Review of Systems - Oncology  MEDICAL HISTORY: Past Medical  History:  Diagnosis Date   Anemia 10/26/2022   Arthritis    osteoarthritis   Breast cancer (HCC)    Diabetes mellitus without complication (HCC)    not on medication   History of CVA (cerebrovascular accident)    Hypertension    Multiple thyroid nodules    benign   Right arm weakness 10/26/2022   Upper back pain 10/26/2022   Word finding difficulty 10/26/2022    SURGICAL HISTORY: Past Surgical History:  Procedure Laterality Date   BIOPSY THYROID     benign   BREAST BIOPSY     BREAST LUMPECTOMY W/ NEEDLE LOCALIZATION Right 05/04/2020    SOCIAL HISTORY: Social History   Socioeconomic History   Marital status: Widowed    Spouse name: Not on file   Number of children: 2   Years of education: Not on file   Highest education level: Not on file  Occupational History   Not on file  Tobacco Use   Smoking status: Never   Smokeless tobacco: Never  Substance and Sexual Activity   Alcohol use: Not Currently   Drug use: Never   Sexual activity: Not Currently  Other Topics Concern   Not on file  Social History Narrative   Not on file   Social Drivers of Health   Financial Resource Strain: Not on file  Food Insecurity: Not on file  Transportation Needs: Not on file  Physical Activity: Not on file  Stress: Not on file  Social Connections: Not on file  Intimate Partner Violence: Not on file    FAMILY HISTORY Family History  Problem Relation Age of Onset   Colon cancer Father        70s   Breast cancer Sister        58-60   Colon cancer Brother        78s   Prostate cancer Brother        late 61s   Breast cancer Half-Sister        late 73s early 75s    ALLERGIES:  has no known allergies.  MEDICATIONS:  Current Outpatient Medications  Medication Sig Dispense Refill   acetaminophen (TYLENOL) 500 MG tablet Take 500 mg by mouth at bedtime.     aspirin EC 81 MG tablet Take 81 mg by mouth daily. Swallow whole.     atorvastatin (LIPITOR) 40 MG tablet Take 40 mg  by mouth daily.     cyanocobalamin (VITAMIN B12) 500 MCG tablet Take 1 tablet (500 mcg total) by mouth daily.     letrozole (FEMARA) 2.5 MG tablet Take 1 tablet (2.5 mg total) by mouth daily. 90 tablet 1   losartan (COZAAR) 50 MG tablet Take 50 mg by mouth daily.     ondansetron (ZOFRAN-ODT) 4 MG disintegrating tablet Take 1 tablet (4 mg total) by mouth every 6 (six) hours as needed for nausea or vomiting. 60 tablet 3   traMADol (ULTRAM) 50 MG tablet Take 1 tablet (50 mg total) by mouth every 6 (six) hours as needed. 90 tablet 0   triamcinolone cream (KENALOG) 0.1 %  SMARTSIG:1 Application Topical 2-3 Times Daily     Vitamin D3 (VITAMIN D) 25 MCG tablet Take 1,000 Units by mouth daily.     No current facility-administered medications for this visit.    PHYSICAL EXAMINATION:  ECOG PERFORMANCE STATUS: 2 - Symptomatic, <50% confined to bed   There were no vitals filed for this visit.   There were no vitals filed for this visit.    Physical Exam Vitals and nursing note reviewed.  Constitutional:      Appearance: Normal appearance. She is ill-appearing. She is not toxic-appearing or diaphoretic.     Comments: Frail.  Elderly seated in wheelchair  HENT:     Head: Normocephalic and atraumatic.     Right Ear: External ear normal.     Left Ear: External ear normal.     Nose: Nose normal. No congestion or rhinorrhea.  Eyes:     General: No scleral icterus.    Extraocular Movements: Extraocular movements intact.     Conjunctiva/sclera: Conjunctivae normal.     Pupils: Pupils are equal, round, and reactive to light.  Cardiovascular:     Rate and Rhythm: Normal rate and regular rhythm.     Heart sounds: Normal heart sounds. No murmur heard.    No friction rub. No gallop.  Pulmonary:     Effort: Pulmonary effort is normal. No respiratory distress.     Breath sounds: Normal breath sounds.  Abdominal:     General: Bowel sounds are normal.     Palpations: Abdomen is soft.      Tenderness: There is no abdominal tenderness. There is no guarding or rebound.  Musculoskeletal:        General: No swelling, tenderness or deformity.     Cervical back: Normal range of motion and neck supple. No rigidity or tenderness.     Right lower leg: No edema.     Left lower leg: No edema.     Comments: Decreased muscle mass  Lymphadenopathy:     Head:     Right side of head: No submental, submandibular, tonsillar, preauricular, posterior auricular or occipital adenopathy.     Left side of head: No submental, submandibular, tonsillar, preauricular, posterior auricular or occipital adenopathy.     Cervical: No cervical adenopathy.     Right cervical: No superficial, deep or posterior cervical adenopathy.    Left cervical: No superficial, deep or posterior cervical adenopathy.     Upper Body:     Right upper body: No supraclavicular, axillary, pectoral or epitrochlear adenopathy.     Left upper body: No supraclavicular, axillary, pectoral or epitrochlear adenopathy.  Skin:    General: Skin is warm and dry.     Coloration: Skin is not jaundiced.  Neurological:     General: No focal deficit present.     Mental Status: She is alert and oriented to person, place, and time.     Cranial Nerves: No cranial nerve deficit.  Psychiatric:        Mood and Affect: Mood normal.        Behavior: Behavior normal.        Thought Content: Thought content normal.        Judgment: Judgment normal.     LABORATORY DATA: I have personally reviewed the data as listed:  Appointment on 01/13/2023  Component Date Value Ref Range Status   CEA ([facility]) 01/13/2023 10.01 (H)  0.00 - 5.00 ng/mL Final   Comment: (NOTE) This test was performed using Beckman Coulter's  paramagnetic chemiluminescent immunoassay. Values obtained from different assay methods cannot be used interchangeably. Please note that up to 8% of patients who smoke may see values 5.1-10.0 ng/ml and 1% of patients who smoke may see CEA  levels >10.0 ng/ml. Performed at [facility], [address], [place], [name] [address]    CA 27.29 01/13/2023 117.2 (H)  0.0 - 38.6 U/mL Final   Comment: (NOTE) Siemens Centaur Immunochemiluminometric Methodology [facility]) Values obtained with different assay methods or kits cannot be used interchangeably. Results cannot be interpreted as absolute evidence of the presence or absence of malignant disease. Performed At: [facility] [address] [place], [name] [id number] Jolene Schimke MD [id number]    Vitamin B-12 01/13/2023 501  180 - 914 pg/mL Final   Comment: (NOTE) This assay is not validated for testing neonatal or myeloproliferative syndrome specimens for Vitamin B12 levels. Performed at [facility], [address] [place], [name] [address]    Sodium 01/13/2023 139  135 - 145 mmol/L Final   Potassium 01/13/2023 4.0  3.5 - 5.1 mmol/L Final   Chloride 01/13/2023 102  98 - 111 mmol/L Final   CO2 01/13/2023 27  22 - 32 mmol/L Final   Glucose, Bld 01/13/2023 117 (H)  70 - 99 mg/dL Final   Glucose reference range applies only to samples taken after fasting for at least 8 hours.   BUN 01/13/2023 12  8 - 23 mg/dL Final   Creatinine 57/32/2025 0.80  0.44 - 1.00 mg/dL Final   Calcium 42/70/6237 10.7 (H)  8.9 - 10.3 mg/dL Final   Total Protein 62/83/1517 6.7  6.5 - 8.1 g/dL Final   Albumin 61/60/7371 3.8  3.5 - 5.0 g/dL Final   AST 07/20/9483 26  15 - 41 U/L Final   ALT 01/13/2023 6  0 - 44 U/L Final   Alkaline Phosphatase 01/13/2023 126  38 - 126 U/L Final   Total Bilirubin 01/13/2023 1.3 (H)  <1.2 mg/dL Final   GFR, Estimated 01/13/2023 >60  >60 mL/min Final   Comment: (NOTE) Calculated using the CKD-EPI Creatinine Equation (2021)    Anion gap 01/13/2023 10  5 - 15 Final   Performed at [facility] at [facility], [address] [place], [name], [address]   WBC Count 01/13/2023 4.9  4.0 - 10.5 K/uL Final    RBC 01/13/2023 3.39 (L)  3.87 - 5.11 MIL/uL Final   Hemoglobin 01/13/2023 10.4 (L)  12.0 - 15.0 g/dL Final   HCT 35/00/9381 32.1 (L)  36.0 - 46.0 % Final   MCV 01/13/2023 94.7  80.0 - 100.0 fL Final   MCH 01/13/2023 30.7  26.0 - 34.0 pg Final   MCHC 01/13/2023 32.4  30.0 - 36.0 g/dL Final   RDW 82/99/3716 14.6  11.5 - 15.5 % Final   Platelet Count 01/13/2023 198  150 - 400 K/uL Final   nRBC 01/13/2023 0.00  0.0 - 0.2 % Final   Neutrophils Relative % 01/13/2023 78  % Final   Neutro Abs 01/13/2023 3.8  1.7 - 7.7 K/uL Final   Lymphocytes Relative 01/13/2023 10  % Final   Lymphs Abs 01/13/2023 0.5 (L)  0.7 - 4.0 K/uL Final   Monocytes Relative 01/13/2023 8  % Final   Monocytes Absolute 01/13/2023 0.4  0.1 - 1.0 K/uL Final   Eosinophils Relative 01/13/2023 2  % Final   Eosinophils Absolute 01/13/2023 0.1  0.0 - 0.5 K/uL Final   Basophils Relative 01/13/2023 1  %  Final   Basophils Absolute 01/13/2023 0.1  0.0 - 0.1 K/uL Final   Immature Granulocytes 01/13/2023 1  % Final   Abs Immature Granulocytes 01/13/2023 0.04  0.00 - 0.07 K/uL Final   nRBC 01/13/2023 0  0 /100 WBC Final   Performed at [facility] at [facility], [address] [place], [name], [address]  Orders Only on 12/28/2022  Component Date Value Ref Range Status   Course ID 12/28/2022 [email or web address]_Multi   Final   Course Start Date 12/28/2022 12/08/2022   Final   Session Number 12/28/2022 9   Final   Course First Treatment Date 12/28/2022 12/12/2022  1:32 PM   Final   Course Last Treatment Date 12/28/2022 12/28/2022  1:20 PM   Final   Course Elapsed Days 12/28/2022 16   Final   Reference Point ID 12/28/2022 [email or web address] DP   Final   Reference Point Dosage Given to Da* 12/28/2022 30  Gy Final   Reference Point Session Dosage Giv* 12/28/2022 3  Gy Final   Reference Point ID 12/28/2022 [email or web address] DP   Final   Reference Point Dosage Given to Da* 12/28/2022 30  Gy Final   Reference Point Session Dosage Giv* 12/28/2022 3  Gy Final    Plan ID 12/28/2022 [email or web address]   Final   Plan Name 12/28/2022 Rt Arm   Final   Plan Fractions Treated to Date 12/28/2022 10   Final   Plan Total Fractions Prescribed 12/28/2022 10   Final   Plan Prescribed Dose Per Fraction 12/28/2022 3  Gy Final   Plan Total Prescribed Dose 12/28/2022 30.000000  Gy Final   Plan Primary Reference Point 12/28/2022 [email or web address] DP   Final   Plan ID 12/28/2022 [email or web address]   Final   Plan Fractions Treated to Date 12/28/2022 10   Final   Plan Total Fractions Prescribed 12/28/2022 10   Final   Plan Prescribed Dose Per Fraction 12/28/2022 3  Gy Final   Plan Total Prescribed Dose 12/28/2022 30.000000  Gy Final   Plan Primary Reference Point 12/28/2022 [email or web address] DP   Final  Orders Only on 12/27/2022  Component Date Value Ref Range Status   Course ID 12/27/2022 C1_Multi   Final   Course Start Date 12/27/2022 12/08/2022   Final   Session Number 12/27/2022 8   Final   Course First Treatment Date 12/27/2022 12/12/2022  1:32 PM   Final   Course Last Treatment Date 12/27/2022 12/27/2022  1:19 PM   Final   Course Elapsed Days 12/27/2022 15   Final   Reference Point ID 12/27/2022 [email or web address] DP   Final   Reference Point Dosage Given to Da* 12/27/2022 27  Gy Final   Reference Point Session Dosage Giv* 12/27/2022 3  Gy Final   Reference Point ID 12/27/2022 [email or web address] DP   Final   Reference Point Dosage Given to Da* 12/27/2022 27  Gy Final   Reference Point Session Dosage Giv* 12/27/2022 3  Gy Final   Plan ID 12/27/2022 [email or web address]   Final   Plan Name 12/27/2022 Rt Arm   Final   Plan Fractions Treated to Date 12/27/2022 9   Final   Plan Total Fractions Prescribed 12/27/2022 10   Final   Plan Prescribed Dose Per Fraction 12/27/2022 3  Gy Final   Plan Total Prescribed Dose 12/27/2022 30.000000  Gy Final   Plan Primary Reference Point 12/27/2022 Humerus_R DP   Final   Plan ID 12/27/2022 [email or web address]   Final   Plan Fractions Treated to Date  12/27/2022 9   Final   Plan  Total Fractions Prescribed 12/27/2022 10   Final   Plan Prescribed Dose Per Fraction 12/27/2022 3  Gy Final   Plan Total Prescribed Dose 12/27/2022 30.000000  Gy Final   Plan Primary Reference [facility] 12/27/2022 [email or web address] DP   Final    RADIOGRAPHIC STUDIES: I have personally reviewed the radiological images as listed and agree with the findings in the report  No results found.  ASSESSMENT/PLAN 89 y.o. 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  Treated with neoadjuvant trastuzumab/pertuzumab/paclitaxel with excellent response.   Received 4 cycles of adjuvant Kadcyla developed toxicity, went on to have single agent trastuzumab to complete 1 year of HER2 targeted therapy in March 2023  Stage IV breast cancer  November 01 2022- Presents with word finding difficulties, new onset back pain and weakness of right arm. MRI head - No acute intracranial abnormality. Moderate to severe chronic small vessel ischemic disease with multiple chronic infarcts.  MRI CT spine  Subcentimeter T1 hypointense, stir hyperintense lesion in C6 vertebral body suspicious for metastatic disease.  Multilevel degenerative change throughout the cervical spine resulting spinal canal stenosis at C3-C4, C4-C5, right neural foraminal stenosis at C6-C7.   Disc degeneration most advanced at C6-C7.  Multiple T2 hyperintense lesions throughout the thoracic spine, concerning for metastatic disease. Likely epidural extension of tumor at T7 with  at least mild spinal canal narrowing.    November 23 2022:  PET/CT- Small hypermetabolic focus in the right lateral breast consistent with small focus of breast cancer.  Diffuse hypermetabolic skeletal metastasis involving the axial and appendicular skeleton. Associated pathologic fractures.  No soft tissue metastatic disease.     Cardiac ECHO- LVEF 50-55%   November 28 2022:  MRI T/L spine-  Extensive osseous  metastases seen throughout the T/L spine that enhances following contrast.  Degenerative changes.    December 01 2022: SPEP with IEP negative.  CA 27-29 112.  CEA 12.1 CMP notable for T. bili 1.3   December 07, 2022: CT simulation for treatment of thoracic spine and right humerus   December 21 2022:  Scheduled follow up for metastatic breast cancer.  Received Fraction 7/10 of XRT to T spine and right shoulder.  Will need discussion of chemotherapy options at completion of XRT  Cancer related pain  December 21 2022- Incomplete relief with Ibuprofen.  Not taking Tramadol because of nausea.  Begin Morphine elixer 5 mg every 4 hrs PRN under cover of Ondansetron  Instructed patient to decrease use of Ibuprofen  Decreased mobility of RUE  December 21 2022- Secondary to pain from metastatic disease and spinal stenosis.    Chemotherapy induced neuropathy  December 21 2022- Involves hands and feet.  Not painful.  Limits activity    Cancer Staging  Malignant neoplasm of upper-outer quadrant of right breast in female, estrogen receptor positive (HCC) Staging form: Breast, AJCC 8th Edition - Clinical stage from 12/18/2019: Stage IB (cT2, cN0, cM0, G3, ER+, PR+, HER2+) - Signed by [name], MD on 01/19/2020 Histopathologic type: Infiltrating duct carcinoma, NOS Stage prefix: Initial diagnosis Method of lymph node assessment: Clinical Nuclear grade: G3 Multigene prognostic tests performed: None Menopausal status: Postmenopausal Ki-67 (%): 40 Stage used in treatment planning: Yes National guidelines used in treatment planning: Yes Type of national guideline used in treatment planning: NCCN    No problem-specific Assessment & Plan notes found for  this encounter.    No orders of the defined types were placed in this encounter.   57  minutes was spent in patient care.  This included time spent preparing to see the patient (e.g., review of tests), obtaining and/or reviewing  separately obtained history, counseling and educating the patient/family/caregiver, ordering medications, tests, or procedures; documenting clinical information in the electronic or other health record, independently interpreting results and communicating results to the patient/family/caregiver as well as coordination of care.       All questions were answered. The patient knows to call the clinic with any problems, questions or concerns.  This note was electronically signed.    [name], MD  01/19/2023 7:24 PM

## 2023-01-20 ENCOUNTER — Other Ambulatory Visit: Payer: Self-pay | Admitting: Oncology

## 2023-01-20 ENCOUNTER — Inpatient Hospital Stay (HOSPITAL_BASED_OUTPATIENT_CLINIC_OR_DEPARTMENT_OTHER): Payer: Medicare Other | Admitting: Oncology

## 2023-01-20 ENCOUNTER — Inpatient Hospital Stay: Payer: Medicare Other

## 2023-01-20 ENCOUNTER — Ambulatory Visit: Payer: Medicare Other | Admitting: Radiation Oncology

## 2023-01-20 ENCOUNTER — Encounter: Payer: Self-pay | Admitting: Oncology

## 2023-01-20 VITALS — BP 137/56 | HR 73 | Temp 97.9°F | Resp 17 | Ht 64.5 in | Wt 137.4 lb

## 2023-01-20 DIAGNOSIS — C7951 Secondary malignant neoplasm of bone: Secondary | ICD-10-CM | POA: Diagnosis not present

## 2023-01-20 DIAGNOSIS — C50411 Malignant neoplasm of upper-outer quadrant of right female breast: Secondary | ICD-10-CM

## 2023-01-20 DIAGNOSIS — Z170 Estrogen receptor positive status [ER+]: Secondary | ICD-10-CM

## 2023-01-20 DIAGNOSIS — Z923 Personal history of irradiation: Secondary | ICD-10-CM

## 2023-01-20 DIAGNOSIS — D649 Anemia, unspecified: Secondary | ICD-10-CM

## 2023-01-20 DIAGNOSIS — Z79899 Other long term (current) drug therapy: Secondary | ICD-10-CM

## 2023-01-20 DIAGNOSIS — M79601 Pain in right arm: Secondary | ICD-10-CM

## 2023-01-20 LAB — CBC WITH DIFFERENTIAL (CANCER CENTER ONLY)
Abs Immature Granulocytes: 0.04 10*3/uL (ref 0.00–0.07)
Basophils Absolute: 0.1 10*3/uL (ref 0.0–0.1)
Basophils Relative: 1 %
Eosinophils Absolute: 0.1 10*3/uL (ref 0.0–0.5)
Eosinophils Relative: 2 %
HCT: 32.9 % — ABNORMAL LOW (ref 36.0–46.0)
Hemoglobin: 10.6 g/dL — ABNORMAL LOW (ref 12.0–15.0)
Immature Granulocytes: 1 %
Lymphocytes Relative: 13 %
Lymphs Abs: 0.5 10*3/uL — ABNORMAL LOW (ref 0.7–4.0)
MCH: 31.2 pg (ref 26.0–34.0)
MCHC: 32.2 g/dL (ref 30.0–36.0)
MCV: 96.8 fL (ref 80.0–100.0)
Monocytes Absolute: 0.4 10*3/uL (ref 0.1–1.0)
Monocytes Relative: 9 %
Neutro Abs: 2.9 10*3/uL (ref 1.7–7.7)
Neutrophils Relative %: 74 %
Platelet Count: 187 10*3/uL (ref 150–400)
RBC: 3.4 MIL/uL — ABNORMAL LOW (ref 3.87–5.11)
RDW: 14.6 % (ref 11.5–15.5)
WBC Count: 3.9 10*3/uL — ABNORMAL LOW (ref 4.0–10.5)
nRBC: 0 % (ref 0.0–0.2)
nRBC: 0 /100{WBCs}

## 2023-01-20 LAB — CMP (CANCER CENTER ONLY)
ALT: 5 U/L (ref 0–44)
AST: 27 U/L (ref 15–41)
Albumin: 3.9 g/dL (ref 3.5–5.0)
Alkaline Phosphatase: 154 U/L — ABNORMAL HIGH (ref 38–126)
Anion gap: 10 (ref 5–15)
BUN: 11 mg/dL (ref 8–23)
CO2: 29 mmol/L (ref 22–32)
Calcium: 10.9 mg/dL — ABNORMAL HIGH (ref 8.9–10.3)
Chloride: 100 mmol/L (ref 98–111)
Creatinine: 0.84 mg/dL (ref 0.44–1.00)
GFR, Estimated: >60 mL/min (ref >60)
Glucose, Bld: 129 mg/dL — ABNORMAL HIGH (ref 70–99)
Potassium: 4.1 mmol/L (ref 3.5–5.1)
Sodium: 138 mmol/L (ref 135–145)
Total Bilirubin: 1 mg/dL (ref <1.2)
Total Protein: 6.6 g/dL (ref 6.5–8.1)

## 2023-01-20 MED ORDER — FENTANYL 12 MCG/HR TD PT72: 1.0000 | MEDICATED_PATCH | TRANSDERMAL | 0 refills | Status: DC

## 2023-01-21 ENCOUNTER — Encounter: Payer: Self-pay | Admitting: Oncology

## 2023-01-21 NOTE — Progress Notes (Signed)
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  She is status post neoadjuvant trastuzumab/pertuzumab/paclitaxel.  She had an excellent response to this, with just a tiny residual invasive carcinoma. She received 4 cycles of adjuvant Kadcyla but this was discontinued due to toxicities. She went on to have single agent trastuzumab to complete 1 year of HER2 targeted therapy. Right diagnostic mammogram in June did not reveal any evidence of malignancy.  Benign postlumpectomy changes.  Benign, stable, biopsied right breast asymmetry.  She is scheduled for bilateral diagnostic mammogram in December and to see Dr. Lequita Halt after that. She does not have evidence of local recurrence but multiple symptoms concerning for possible metastatic disease. Her MRI scans were suspicious for recurrent breast cancer. PET Scan revealed  multiple areas of metastatic bone disease. MRI thoracic imaging on 11/28/2022 revealed excessive osseous metastasis seen throughout the thoracic and lumbar spine. She completed palliative radiation to right right shoulder and T-spine on 12/28/2022 with Dr. Thersa Salt, which gave her relief of that pain but now has multiple other areas of constant pain..  Right arm weakness New weakness and pain of her right arm, consistent with cervical radiculopathy. Due to her history of HER2 receptor positive breast cancer, she is at increased risk for metastatic disease. CT of thoracic spine on 11/01/2022 showed there is likely evidence of epidural extension of tumor at the T7 level with likely at least mild spinal canal narrowing.   Word finding difficulty Difficulty  with word finding, in addition to the right arm weakness. Not oriented to year. She has a prior history of left sided stroke. Also, concerning for possible metastatic disease. MRI of the brain done on 11/01/2022 shows no acute intracranial abnormality and moderate to severe chronic small vessel ischemic disease with multiple chronic infarctions.   Upper back pain Due to her HER2 receptor positive breast cancer. MRI of the thoracic spine done on 11/01/2022 multiple T2 hyperintense lesions throughout the thoracic spine, concerning for metastatic disease, there is likely evidence of epidural extension of tumor at the T7 level with likely at least mild spinal canal narrowing, and non-specific T2 hyperintense lesion in the posterior right hepatic lobe measuring 6mm, so she has a possible liver metastasis as well.   Anemia Mild anemia. On 10/27/2022 her vitamin B-12, folate, ferritin, iron, and TIBC were all normal.    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   Plan: She was found to have widespread bone metastases in October of 2024. The patient comes in today for follow-up after completing palliative radiation to T-spine and right shoulder with Dr. Thersa Salt on 12/28/2022. She did have some relief of pain in that area but now complains of constant pain of her lower back and multiple areas. She is unable to sleep due to this and unable to function, is currently in a wheelchair and unable to ambulate. She iis not moving her bowels very well.She was seen by Dr. Angelene Giovanni on 12/28/2022 for follow-up.  Per his note, she was supposed to start letrozole 2.5 mg daily but has refused. He also prescribed liquid morphine but she has refused to try it. She is taking tramadol 50 mg every  4-6 hours with partial relief of her pain although it is causing her to feel tired all the time.Today I discussed the options of hormonal therapy such as an oral aromatase inhibitor  or fulvestrant injections, with little toxicity but limited benefit, and/or HER2 directed therapy such as trastuzumab +/- pertuzumab. Her recent echocardiogram looks good. A more aggressive approach would be Enhertu, but with more risk of toxicities. She is willing to try the SQ trastuzumab so I will work on arranging that. I would also recommend hormonal therapy and bone strengthening medicine but she is hesitant and will think about it. In the meantime we will work on better control of her pain by adding fentanyl 12.5 patch every 3 days and Miralax daily as needed.  I also discussed possible methadone at low dose for an alternative long acting opioid. CBC reveals a WBC of 3.9 and hemoglobin of 10.9. CMP is mainly remarkable for a calcium of 10.9.  I will plan to see her back in 4 weeks when she is due for her 2nd dose of trastuzumab with repeat CBC and CMP. She is here with her daughter today.  She denies any nausea or vomiting, or diarrhea. She denies signs of infection such as sore throat, sinus drainage, cough, or urinary symptoms.  She denies fevers or recurrent chills. She denies pain. She denies nausea, vomiting, chest pain, dyspnea or cough. Her appetite is fair and her weight is up 2 pounds since her last visit. We also discussed the option of comfort care and hospice and I reviewed their services but explained she could not be on active treatment.   I spent 40 minutes dedicated to the care of this patient (face-to-face and non-face-to-face) on the date of the encounter to include what is described in the assessment and plan.   [name], NP 01/21/2023 4:59 PM  No orders of the defined types were placed in this encounter.   CHIEF COMPLAINT:  CC: Stage IB HER2 receptor positive breast cancer  Current Treatment: Observation  HISTORY OF PRESENT ILLNESS:   Oncology History  Malignant neoplasm of upper-outer quadrant of right breast in female, estrogen receptor positive (HCC)   12/18/2019 Cancer Staging   Staging form: Breast, AJCC 8th Edition - Clinical stage from 12/18/2019: Stage IB (cT2, cN0, cM0, G3, ER+, PR+, HER2+) - Signed by [name], MD on 01/19/2020   01/08/2020 Initial Diagnosis   Breast cancer, right (HCC)   01/28/2020 - 03/26/2020 Chemotherapy    Patient is on Treatment Plan: BREAST TRASTUZUMAB + PERTUZUMAB Q21D X 11 CYCLES      06/01/2020 - 06/01/2020 Chemotherapy         06/17/2020 - 07/29/2020 Chemotherapy         09/17/2020 - 04/16/2021 Chemotherapy   Patient is on Treatment Plan : BREAST Trastuzumab q21d X 11 Cycles     01/31/2023 -  Chemotherapy   Patient is on Treatment Plan : BREAST MAINTENANCE Trastuzumab IV (6) or SQ (600) D1 q21d x 13 cycles     Malignant neoplasm metastatic to bone (HCC)  11/10/2022 Initial Diagnosis   Malignant neoplasm metastatic to bone (HCC)   01/31/2023 -  Chemotherapy   Patient is on Treatment Plan : BREAST MAINTENANCE Trastuzumab IV (6) or SQ (600) D1 q21d x 13 cycles         INTERVAL HISTORY:  Brandy Jacobs is an 89 year old woman with Stage IB HER2 positive, hormone positive breast cancer diagnosed in November of 2021 and treated with  neoadjuvant chemotherapy with THP. Postop she received 4 cycles of Kadcyla followed by trastuzumab and pertuzumab. She was found to have widespread bone metastases in October of 2024. The patient comes in today for follow-up after completing palliative radiation to T-spine and right shoulder with Dr. Thersa Salt on 12/28/2022. She did have some relief of pain in that area but now complains of constant pain of her lower back and multiple areas. She is unable to sleep due to this and unable to function, is currently in a wheelchair and unable to ambulate. She iis not moving her bowels very well.She was seen by Dr. Angelene Giovanni on 12/28/2022 for follow-up.  Per his note, she was supposed to start letrozole 2.5 mg daily but has refused. He also prescribed liquid morphine but she has  refused to try it. She is taking tramadol 50 mg every 4-6 hours with partial relief of her pain although it is causing her to feel tired all the time.Today I discussed the options of hormonal therapy such as an oral aromatase inhibitor or fulvestrant injections, with little toxicity but limited benefit, and/or HER2 directed therapy such as trastuzumab +/- pertuzumab. Her recent echocardiogram looks good. A more aggressive approach would be Enhertu, but with more risk of toxicities. She is willing to try the SQ trastuzumab so I will work on arranging that. I would also recommend hormonal therapy and bone strengthening medicine but she is hesitant and will think about it. In the meantime we will work on better control of her pain by adding fentanyl 12.5 patch every 3 days and Miralax daily as needed.  I also discussed possible methadone at low dose for an alternative long acting opioid. CBC reveals a WBC of 3.9 and hemoglobin of 10.9. CMP is mainly remarkable for a calcium of 10.9.  I will plan to see her back in 4 weeks when she is due for her 2nd dose of trastuzumab with repeat CBC and CMP. She is here with her daughter today.  She denies any nausea or vomiting, or diarrhea. She denies signs of infection such as sore throat, sinus drainage, cough, or urinary symptoms.  She denies fevers or recurrent chills. She denies pain. She denies nausea, vomiting, chest pain, dyspnea or cough. Her appetite is fair and her weight is up 2 pounds since her last visit. We also discussed the option of comfort care and hospice and I reviewed their services but explained she could not be on active treatment.   Exam: 11/01/2022 MRI of Cervical Spine without Contrast Impression: Sub-centimeter T1 hypointense, stir hyperintense lesion in the C6 vertebral body suspicious for metastatic disease or multiple myeloma.  Multiple degenerative change throughout the cervical spine resulting in up to mild to moderate spinal canal stenosis  at C3-C4 and C4-C5, and moderate right neural foraminal stenosis at C6-C7 with degenerative disc disease most advanced at C6-C7.  Thoracic spine MRI reported separately.    Exam: 11/01/2022 MRI of Thoracic Spine without Contrast Impression:  multiple T2 hyperintense lesions throughout the thoracic spine, as above, concerning for metastatic disease. There is likely evidence of epidural extension of tumor at the T7 level with likely at least mild spinal canal narrowing. Recommend contrast enhanced MRI of the thoracic spine for further evaluation.  Non-specific T2 hyperintense lesion in the posterior right hepatic lobe measuring 6mm. Recommend further evaluation with dedicated abdominal MRI with and without contrast.    Exam: 11/01/2022 MRI Head without Contrast Impression: No acute intracranial abnormality. Moderate to severe chronic small vessel ischemic  disease with multiple chronic infarcts as above.   REVIEW OF SYSTEMS:  Review of Systems  Constitutional:  Positive for fatigue and unexpected weight change. Negative for fever.  Respiratory:  Negative for cough and shortness of breath.   Gastrointestinal:  Negative for abdominal distention, abdominal pain, constipation, diarrhea, nausea and vomiting.  Musculoskeletal:  Positive for arthralgias, back pain and gait problem. Negative for myalgias.  Neurological:  Positive for extremity weakness and gait problem.  Psychiatric/Behavioral:  Positive for sleep disturbance.    Past Surgical History:  Procedure Laterality Date   BIOPSY THYROID     benign   BREAST BIOPSY     BREAST LUMPECTOMY W/ NEEDLE LOCALIZATION Right 05/04/2020     VITALS:  Blood pressure (!) 137/56, pulse 73, temperature 97.9 F (36.6 C), temperature source Oral, resp. rate 17, height 5' 4.5" (1.638 m), weight 137 lb 6.4 oz (62.3 kg), SpO2 97%.  Wt Readings from Last 3 Encounters:  01/20/23 137 lb 6.4 oz (62.3 kg)  01/13/23 135 lb (61.2 kg)  12/21/22 140 lb 6.4 oz  (63.7 kg)    Body mass index is 23.22 kg/m.  Performance status (ECOG): 2 - Symptomatic, <50% confined to bed  PHYSICAL EXAM:  Physical Exam Constitutional:      Appearance: She is well-developed.  HENT:     Head: Normocephalic and atraumatic.  Eyes:     Pupils: Pupils are equal, round, and reactive to light.  Cardiovascular:     Rate and Rhythm: Normal rate and regular rhythm.     Heart sounds: No murmur heard. Pulmonary:     Effort: Pulmonary effort is normal.     Breath sounds: Normal breath sounds. No wheezing.  Abdominal:     General: Bowel sounds are normal. There is no distension.     Palpations: Abdomen is soft. There is no mass.     Tenderness: There is no abdominal tenderness.  Musculoskeletal:        General: Normal range of motion.     Cervical back: Normal range of motion.  Skin:    General: Skin is warm and dry.  Neurological:     Mental Status: She is alert and oriented to person, place, and time.  Psychiatric:        Behavior: Behavior normal.   LABS:      Latest Ref Rng & Units 01/20/2023    1:14 PM 01/13/2023    1:54 PM 12/21/2022    2:27 PM  CBC  WBC 4.0 - 10.5 K/uL 3.9  4.9  5.6   Hemoglobin 12.0 - 15.0 g/dL 34.7  42.5  95.6   Hematocrit 36.0 - 46.0 % 32.9  32.1  34.5   Platelets 150 - 400 K/uL 187  198  178       Latest Ref Rng & Units 01/20/2023    1:14 PM 01/13/2023    1:54 PM 12/21/2022    2:27 PM  CMP  Glucose 70 - 99 mg/dL 387  564  93   BUN 8 - 23 mg/dL 11  12  14    Creatinine 0.44 - 1.00 mg/dL 3.32  9.51  8.84   Sodium 135 - 145 mmol/L 138  139  139   Potassium 3.5 - 5.1 mmol/L 4.1  4.0  4.3   Chloride 98 - 111 mmol/L 100  102  103   CO2 22 - 32 mmol/L 29  27  28    Calcium 8.9 - 10.3 mg/dL 16.6  06.3  01.6  Total Protein 6.5 - 8.1 g/dL 6.6  6.7  6.8   Total Bilirubin <1.2 mg/dL 1.0  1.3  1.5   Alkaline Phos 38 - 126 U/L 154  126  111   AST 15 - 41 U/L 27  26  24    ALT 0 - 44 U/L 5  6  8     Component Ref Range & Units  10/27/2022   Vitamin B-12 180 - 914 pg/mL 234   Component Ref Range & Units 10/27/2022  Folate >5.9 ng/mL 16.9   Lab Results  Component Value Date   CEA 10.01 (H) 01/13/2023   /  CEA [facility])  Date Value Ref Range Status  01/13/2023 10.01 (H) 0.00 - 5.00 ng/mL Final    Comment:    (NOTE) This test was performed using Beckman Coulter's paramagnetic chemiluminescent immunoassay. Values obtained from different assay methods cannot be used interchangeably. Please note that up to 8% of patients who smoke may see values 5.1-10.0 ng/ml and 1% of patients who smoke may see CEA levels >10.0 ng/ml. Performed at [facility], [name] [place], [name] [address]    No results found for: "PSA1" No results found for: "OZD664" No results found for: "CAN125"  Lab Results  Component Value Date   TOTALPROTELP 6.5 12/01/2022   ALBUMINELP 3.9 12/01/2022   A1GS 0.3 12/01/2022   A2GS 0.7 12/01/2022   BETS 0.9 12/01/2022   GAMS 0.8 12/01/2022   MSPIKE Not Observed 12/01/2022   SPEI Comment 12/01/2022   Lab Results  Component Value Date   TIBC 298 10/27/2022   FERRITIN 96 10/27/2022   IRONPCTSAT 24 10/27/2022   No results found for: "LDH"  STUDIES:  Her PET scan on 11-23-22 revealed the following:  FINDINGS: Mediastinal blood pool activity: SUV max 2.40  Liver activity: SUV max NA  NECK: No hypermetabolic lymph nodes in the neck. Focus of hypermetabolism near the left tonsillar/tongue base lesion on the left. SUV max is 5.53. There is some soft tissue thickening in this area. Neoplasm versus infection. Recommend direct visualization.  Incidental CT findings: None.  CHEST: Small hypermetabolic focus in the right lateral breast has an SUV max of 5.86 and consistent with small focus of breast cancer. No enlarged or hypermetabolic supraclavicular or axillary lymph nodes. No mediastinal or hilar adenopathy.  Incidental CT findings: Stable advanced  vascular disease. No pericardial effusion. No worrisome pulmonary nodules to suggest pulmonary metastatic disease.  ABDOMEN/PELVIS: No abnormal hypermetabolic activity within the liver, pancreas, adrenal glands, or spleen. No hypermetabolic lymph nodes in the abdomen or pelvis.  Scattered hypermetabolic foci in the colon without CT correlate. This is likely physiologic or inflammatory. Recommend correlation with colon cancer screening history.  Incidental CT findings: Diffuse colonic diverticulosis most severe in the sigmoid colon. Large calcified uterine fibroid noted. Advanced vascular disease without aneurysm. Mild cystocele noted.  SKELETON: Diffuse hypermetabolic skeletal metastasis. This involves the axial and appendicular skeleton.  Index lesions:  Right humerus has SUV max of 13.29. Clear destructive changes involving the cortex.  Anterior first right rib has SUV max of 8.91.  Left fourth posterolateral rib has an SUV max of 13.99 with advanced destructive bony changes.  T7 vertebral body has an SUV max of 11.61. Destructive bony changes on the CT scan.  Incidental CT findings: None.  IMPRESSION: 1. Small hypermetabolic focus in the right lateral breast consistent with small focus of breast cancer. 2. Diffuse hypermetabolic skeletal metastasis involving the axial and appendicular skeleton. Associated pathologic  fractures. 3. No findings for soft tissue metastatic disease. 4. Focus of hypermetabolism near the left tonsillar/tongue base lesion on the left. There is some soft tissue thickening in this area. Neoplasm versus infection. Recommend direct visualization. 5. Scattered hypermetabolic foci in the colon without CT correlate. This is likely physiologic or inflammatory. Recommend correlation with colon cancer screening history.  HISTORY:   Past Medical History:  Diagnosis Date   Anemia 10/26/2022   Arthritis    osteoarthritis   Breast cancer (HCC)     Diabetes mellitus without complication (HCC)    not on medication   History of CVA (cerebrovascular accident)    Hypertension    Multiple thyroid nodules    benign   Right arm weakness 10/26/2022   Upper back pain 10/26/2022   Word finding difficulty 10/26/2022      Family History  Problem Relation Age of Onset   Colon cancer Father        68s   Breast cancer Sister        35-60   Colon cancer Brother        30s   Prostate cancer Brother        late 28s   Breast cancer Half-Sister        late 11s early 17s    Social History:  reports that she has never smoked. She has never used smokeless tobacco. She reports that she does not currently use alcohol. She reports that she does not use drugs.The patient is accompanied by her daughter today.  Allergies: No Known Allergies  Current Medications: Current Outpatient Medications  Medication Sig Dispense Refill   gabapentin (NEURONTIN) 100 MG capsule Take 100 mg by mouth at bedtime as needed.     acetaminophen (TYLENOL) 500 MG tablet Take 500 mg by mouth at bedtime.     aspirin EC 81 MG tablet Take 81 mg by mouth daily. Swallow whole.     atorvastatin (LIPITOR) 40 MG tablet Take 40 mg by mouth daily.     cyanocobalamin (VITAMIN B12) 500 MCG tablet Take 1 tablet (500 mcg total) by mouth daily.     fentaNYL (DURAGESIC) 12 MCG/HR Place 1 patch onto the skin every 3 (three) days. 5 patch 0   letrozole (FEMARA) 2.5 MG tablet Take 1 tablet (2.5 mg total) by mouth daily. 90 tablet 1   losartan (COZAAR) 50 MG tablet Take 50 mg by mouth daily.     ondansetron (ZOFRAN-ODT) 4 MG disintegrating tablet Take 1 tablet (4 mg total) by mouth every 6 (six) hours as needed for nausea or vomiting. 60 tablet 3   traMADol (ULTRAM) 50 MG tablet Take 1 tablet (50 mg total) by mouth every 6 (six) hours as needed. 90 tablet 0   triamcinolone cream (KENALOG) 0.1 % SMARTSIG:1 Application Topical 2-3 Times Daily     Vitamin D3 (VITAMIN D) 25 MCG tablet Take  1,000 Units by mouth daily.     No current facility-administered medications for this visit.

## 2023-01-22 ENCOUNTER — Encounter: Payer: Self-pay | Admitting: Oncology

## 2023-01-24 ENCOUNTER — Telehealth: Payer: Self-pay | Admitting: Oncology

## 2023-01-24 ENCOUNTER — Encounter: Payer: Self-pay | Admitting: Oncology

## 2023-01-24 NOTE — Telephone Encounter (Signed)
 Contacted pt to schedule an appt. Unable to reach via phone, voicemail was left.    new Rx Received: 4 days ago Cornelius Wanda DEL, MD  Phy, Devere HERO, [facility]; Bartell, Laurel G, RN; Lonzell Race; Cosme Aquas; Figueroa, Damaris She is reluctantly agreeable to starting SQ Herceptin  so need to schedule the week of 1/6 Then she will need appt with me, labs and infusion 3 weeks later  Ideally would give hormonal therapy also but she is refusing, will later discuss Marzette, one thing at a time       Comments  01/24/23 - Auth pending /DF

## 2023-01-26 NOTE — Telephone Encounter (Signed)
 Patient has been scheduled. Aware of appt date and time.

## 2023-01-27 ENCOUNTER — Other Ambulatory Visit: Payer: Self-pay

## 2023-01-27 ENCOUNTER — Encounter: Payer: Self-pay | Admitting: Oncology

## 2023-01-27 ENCOUNTER — Other Ambulatory Visit: Payer: Self-pay | Admitting: Oncology

## 2023-01-27 DIAGNOSIS — Z170 Estrogen receptor positive status [ER+]: Secondary | ICD-10-CM

## 2023-01-27 DIAGNOSIS — C7951 Secondary malignant neoplasm of bone: Secondary | ICD-10-CM

## 2023-01-27 DIAGNOSIS — Y633 Inadvertent exposure of patient to radiation during medical care: Secondary | ICD-10-CM | POA: Insufficient documentation

## 2023-01-30 ENCOUNTER — Encounter: Payer: Self-pay | Admitting: Oncology

## 2023-01-31 ENCOUNTER — Inpatient Hospital Stay: Payer: Medicare Other | Attending: Hematology and Oncology

## 2023-01-31 ENCOUNTER — Inpatient Hospital Stay: Payer: Medicare Other

## 2023-01-31 ENCOUNTER — Other Ambulatory Visit: Payer: Self-pay | Admitting: Oncology

## 2023-01-31 VITALS — BP 107/67 | HR 67 | Temp 97.6°F | Resp 18 | Ht 64.5 in | Wt 138.0 lb

## 2023-01-31 DIAGNOSIS — G893 Neoplasm related pain (acute) (chronic): Secondary | ICD-10-CM | POA: Diagnosis not present

## 2023-01-31 DIAGNOSIS — D649 Anemia, unspecified: Secondary | ICD-10-CM | POA: Diagnosis not present

## 2023-01-31 DIAGNOSIS — C50411 Malignant neoplasm of upper-outer quadrant of right female breast: Secondary | ICD-10-CM | POA: Insufficient documentation

## 2023-01-31 DIAGNOSIS — R41841 Cognitive communication deficit: Secondary | ICD-10-CM | POA: Insufficient documentation

## 2023-01-31 DIAGNOSIS — R748 Abnormal levels of other serum enzymes: Secondary | ICD-10-CM | POA: Insufficient documentation

## 2023-01-31 DIAGNOSIS — R531 Weakness: Secondary | ICD-10-CM | POA: Diagnosis not present

## 2023-01-31 DIAGNOSIS — C7951 Secondary malignant neoplasm of bone: Secondary | ICD-10-CM

## 2023-01-31 DIAGNOSIS — Z5111 Encounter for antineoplastic chemotherapy: Secondary | ICD-10-CM | POA: Diagnosis present

## 2023-01-31 DIAGNOSIS — Z9221 Personal history of antineoplastic chemotherapy: Secondary | ICD-10-CM | POA: Insufficient documentation

## 2023-01-31 DIAGNOSIS — Z170 Estrogen receptor positive status [ER+]: Secondary | ICD-10-CM | POA: Insufficient documentation

## 2023-01-31 DIAGNOSIS — Z923 Personal history of irradiation: Secondary | ICD-10-CM | POA: Insufficient documentation

## 2023-01-31 LAB — CBC WITH DIFFERENTIAL (CANCER CENTER ONLY)
Abs Immature Granulocytes: 0.06 10*3/uL (ref 0.00–0.07)
Basophils Absolute: 0 10*3/uL (ref 0.0–0.1)
Basophils Relative: 1 %
Eosinophils Absolute: 0.2 10*3/uL (ref 0.0–0.5)
Eosinophils Relative: 4 %
HCT: 32.6 % — ABNORMAL LOW (ref 36.0–46.0)
Hemoglobin: 10.6 g/dL — ABNORMAL LOW (ref 12.0–15.0)
Immature Granulocytes: 1 %
Lymphocytes Relative: 9 %
Lymphs Abs: 0.4 10*3/uL — ABNORMAL LOW (ref 0.7–4.0)
MCH: 30.9 pg (ref 26.0–34.0)
MCHC: 32.5 g/dL (ref 30.0–36.0)
MCV: 95 fL (ref 80.0–100.0)
Monocytes Absolute: 0.3 10*3/uL (ref 0.1–1.0)
Monocytes Relative: 8 %
Neutro Abs: 3.2 10*3/uL (ref 1.7–7.7)
Neutrophils Relative %: 77 %
Platelet Count: 206 10*3/uL (ref 150–400)
RBC: 3.43 MIL/uL — ABNORMAL LOW (ref 3.87–5.11)
RDW: 14.6 % (ref 11.5–15.5)
WBC Count: 4.2 10*3/uL (ref 4.0–10.5)
nRBC: 0 % (ref 0.0–0.2)
nRBC: 0 /100{WBCs}

## 2023-01-31 LAB — CMP (CANCER CENTER ONLY)
ALT: 7 U/L (ref 0–44)
AST: 27 U/L (ref 15–41)
Albumin: 3.8 g/dL (ref 3.5–5.0)
Alkaline Phosphatase: 150 U/L — ABNORMAL HIGH (ref 38–126)
Anion gap: 11 (ref 5–15)
BUN: 12 mg/dL (ref 8–23)
CO2: 25 mmol/L (ref 22–32)
Calcium: 10.7 mg/dL — ABNORMAL HIGH (ref 8.9–10.3)
Chloride: 100 mmol/L (ref 98–111)
Creatinine: 0.85 mg/dL (ref 0.44–1.00)
GFR, Estimated: >60 mL/min (ref >60)
Glucose, Bld: 210 mg/dL — ABNORMAL HIGH (ref 70–99)
Potassium: 3.9 mmol/L (ref 3.5–5.1)
Sodium: 136 mmol/L (ref 135–145)
Total Bilirubin: 1 mg/dL (ref 0.0–1.2)
Total Protein: 6.5 g/dL (ref 6.5–8.1)

## 2023-01-31 LAB — CEA (ACCESS): CEA (CHCC): 10.17 ng/mL — ABNORMAL HIGH (ref 0.00–5.00)

## 2023-01-31 MED ORDER — ACETAMINOPHEN 325 MG PO TABS
650.0000 mg | ORAL_TABLET | Freq: Once | ORAL | Status: AC
  Administered 2023-01-31: 650 mg via ORAL
  Filled 2023-01-31: qty 2

## 2023-01-31 MED ORDER — DIPHENHYDRAMINE HCL 25 MG PO CAPS
50.0000 mg | ORAL_CAPSULE | Freq: Once | ORAL | Status: AC
  Administered 2023-01-31: 50 mg via ORAL
  Filled 2023-01-31: qty 2

## 2023-01-31 MED ORDER — TRASTUZUMAB-HYALURONIDASE-OYSK 600-10000 MG-UNT/5ML ~~LOC~~ SOLN
600.0000 mg | Freq: Once | SUBCUTANEOUS | Status: AC
  Administered 2023-01-31: 600 mg via SUBCUTANEOUS
  Filled 2023-01-31: qty 5

## 2023-01-31 NOTE — Progress Notes (Signed)
..  Pharmacist Chemotherapy Monitoring - Initial Assessment    Anticipated start date: 01/31/23  The following has been reviewed per standard work regarding the patient's treatment regimen: The patient's diagnosis, treatment plan and drug doses, and organ/hematologic function Lab orders and baseline tests specific to treatment regimen  The treatment plan start date, drug sequencing, and pre-medications Prior authorization status  Patient's documented medication list, including drug-drug interaction screen and prescriptions for anti-emetics and supportive care specific to the treatment regimen The drug concentrations, fluid compatibility, administration routes, and timing of the medications to be used The patient's access for treatment and lifetime cumulative dose history, if applicable  The patient's medication allergies and previous infusion related reactions, if applicable   Changes made to treatment plan:  N/A  Follow up needed:  N/A   Brandy Jacobs [place], [facility], 01/31/2023  2:11 PM

## 2023-01-31 NOTE — Patient Instructions (Signed)
 Trastuzumab; Hyaluronidase Injection What is this medication? TRASTUZUMAB; HYALURONIDASE (tras TOO zoo mab; hye al ur ON i dase) treats breast cancer. Trastuzumab works by blocking a protein that causes cancer cells to grow and multiply. This helps to slow or stop the spread of cancer cells. Hyaluronidase works by increasing the absorption of other medications in the body to help them work better. It is a combination medication that contains a monoclonal antibody. This medicine may be used for other purposes; ask your health care provider or pharmacist if you have questions. COMMON BRAND NAME(S): HERCEPTIN HYLECTA What should I tell my care team before I take this medication? They need to know if you have any of these conditions: Heart failure Lung disease An unusual or allergic reaction to trastuzumab, or other medications, foods, dyes, or preservatives Pregnant or trying to get pregnant Breast-feeding How should I use this medication? This medication is injected under the skin. It is given by your care team in a hospital or clinic setting. Talk to your care team about the use of this medication in children. It is not approved for use in children. Overdosage: If you think you have taken too much of this medicine contact a poison control center or emergency room at once. NOTE: This medicine is only for you. Do not share this medicine with others. What if I miss a dose? Keep appointments for follow-up doses. It is important not to miss your dose. Call your care team if you are unable to keep an appointment. What may interact with this medication? Certain types of chemotherapy, such as daunorubicin, doxorubicin, epirubicin, idarubicin This list may not describe all possible interactions. Give your health care provider a list of all the medicines, herbs, non-prescription drugs, or dietary supplements you use. Also tell them if you smoke, drink alcohol, or use illegal drugs. Some items may interact  with your medicine. What should I watch for while using this medication? Your condition will be monitored carefully while you are receiving this medication. This medication may make you feel generally unwell. This is not uncommon as chemotherapy can affect healthy cells as well as cancer cells. Report any side effects. Continue your course of treatment even though you feel ill unless your care team tells you to stop. This medication may increase your risk of getting an infection. Call your care team for advice if you get a fever, chills, sore throat, or other symptoms of a cold or flu. Do not treat yourself. Try to avoid being around people who are sick. Avoid taking medications that contain aspirin, acetaminophen, ibuprofen, naproxen, or ketoprofen unless instructed by your care team. These medications may hide a fever. Talk to your care team if you may be pregnant. Serious birth defects can occur if you take this medication during pregnancy and for 7 months after the last dose. You will need a negative pregnancy test before starting this medication. Contraception is recommended while taking this medication and for 7 months after the last dose. Your care team can help you find the option that works for you. Do not breastfeed while taking this medication or for 7 months after the last dose. What side effects may I notice from receiving this medication? Side effects that you should report to your care team as soon as possible: Allergic reactions or angioedema--skin rash, itching or hives, swelling of the face, eyes, lips, tongue, arms, or legs, trouble swallowing or breathing Dry cough, shortness of breath or trouble breathing Heart failure--shortness of breath, swelling  of the ankles, feet, or hands, sudden weight gain, unusual weakness or fatigue Heart rhythm changes--fast or irregular heartbeat, dizziness, feeling faint or lightheaded, chest pain, trouble breathing Increase in blood  pressure Infection--fever, chills, cough, or sore throat Side effects that usually do not require medical attention (report these to your care team if they continue or are bothersome): Diarrhea Hair loss Headache Muscle pain Nausea Unusual weakness or fatigue This list may not describe all possible side effects. Call your doctor for medical advice about side effects. You may report side effects to FDA at 1-800-FDA-1088. Where should I keep my medication? This medication is given in a hospital or clinic. It will not be stored at home. NOTE: This sheet is a summary. It may not cover all possible information. If you have questions about this medicine, talk to your doctor, pharmacist, or health care provider.  2024 Elsevier/Gold Standard (2021-05-25 00:00:00)

## 2023-02-01 ENCOUNTER — Telehealth: Payer: Self-pay

## 2023-02-01 LAB — CANCER ANTIGEN 27.29: CA 27.29: 130.8 U/mL — ABNORMAL HIGH (ref 0.0–38.6)

## 2023-02-01 NOTE — Telephone Encounter (Signed)
 I called pt to see how she is feeling today, since getting the Herceptin  yesterday. Pt replied, I'm feeling alright. I just slept late this morning. I reminded pt she got benadryl  yesterday before the Herceptin , which causes drowsiness/gives feeling of being sleepy. She acknowledged they gave her the benadryl . She denies skin reactions, itching, SOB, cough, chest pain, bone and joint pain, N/V, diarrhea, and fever. Constipation is something she deals with normally. I asked if she took anything for constipation. She replied, No. I encouraged her to take a stool softener BID. I also reminded her to call us  if she develops temp of 100.4 or higher, day or night. She verbalized understanding.

## 2023-02-03 LAB — GUARDANT 360

## 2023-02-08 ENCOUNTER — Other Ambulatory Visit: Payer: Self-pay | Admitting: Oncology

## 2023-02-08 DIAGNOSIS — C7951 Secondary malignant neoplasm of bone: Secondary | ICD-10-CM

## 2023-02-16 NOTE — Progress Notes (Signed)
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  She is status post neoadjuvant trastuzumab/pertuzumab/paclitaxel.  She had an excellent response to this, with just a tiny residual invasive carcinoma. She received 4 cycles of adjuvant Kadcyla but this was discontinued due to toxicities. She went on to have single agent trastuzumab to complete 1 year of HER2 targeted therapy. Right diagnostic mammogram in June did not reveal any evidence of malignancy.  She had benign postlumpectomy changes and benign, stable, biopsied right breast asymmetry.  She is scheduled for bilateral diagnostic mammogram in December and to see Dr. Lequita Halt after that.   Malignant neoplasm metastatic to bone She does not have evidence of local recurrence but multiple symptoms concerning for possible metastatic disease. Her MRI scans were suspicious for recurrent breast cancer. PET Scan revealed  multiple areas of metastatic bone disease. MRI thoracic imaging on 11/28/2022 revealed excessive osseous metastasis seen throughout the thoracic and lumbar spine. She completed palliative radiation to right right shoulder and T-spine on 12/28/2022 with Dr. Thersa Salt, which gave her relief of that pain. She was still having constant pain which was disturbing her sleep and limiting her activities. We started the Herceptin and her symptoms are already improving and she is able to sleep through the night.   Right arm weakness CT of thoracic spine on 11/01/2022 showed there is likely evidence of epidural extension of tumor at the T7 level with likely at least mild spinal canal narrowing. She has received  radiation to that area with some improvement. She is also on low-doses of analgesics and Herceptin.  Word finding difficulty Difficulty with word finding, in addition to the right arm weakness. MRI of the brain done on 11/01/2022 shows no acute intracranial abnormality and moderate to severe chronic small vessel ischemic disease with multiple chronic infarctions.    Upper back pain Due to her HER2 receptor positive breast cancer. MRI of the thoracic spine done on 11/01/2022 multiple T2 hyperintense lesions throughout the thoracic spine, concerning for metastatic disease, there is likely evidence of epidural extension of tumor at the T7 level with likely at least mild spinal canal narrowing, and non-specific T2 hyperintense lesion in the posterior right hepatic lobe measuring 6mm, so she has a possible liver metastasis as well.   Anemia Mild anemia. On 10/27/2022 her vitamin B-12, folate, ferritin, iron, and TIBC were all normal.    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  Plan: She was found to have widespread bone metastases in October of 2024, with epidural extension at T7. The patient completed palliative radiation to T-spine and right shoulder with Dr. Thersa Salt on 12/28/2022. She has previously refused Letrozole. She was started on trastuzumab subcutaneous in early January, 2025.  She is now able to use her right arm to some degree and move about better. She is using the fentanyl patch 12 mcg and requires 2 tramadol pills per day. She has had her first dose of treatment of SQ trastuzumab in early January of 2025 and tolerated this well. Her day 1 cycle 2 of Herceptin/Hylecta is scheduled on 02/21/2023. She has a WBC of 4.0, a low hemoglobin of 10.5, and platelet count of  177,000. Her CMP is normal other than a low total protein of 6.4 and elevated alkaline phosphatase of 197 up from 150. I will see her back in 3 weeks with CBC, CMP, and CA  27.29.   I provided 17 minutes of face-to-face time during this this encounter and > 50% was spent counseling as documented under my assessment and plan.    Brandy Beckwith, MD  [name] [address] [name] [address] Dept: [phone] Dept Fax: [phone]   No orders of the defined types were placed in this encounter.   CHIEF COMPLAINT:  CC: Stage IB HER2 receptor positive breast cancer  Current Treatment: Observation  HISTORY OF PRESENT ILLNESS:   Oncology History  Malignant neoplasm of upper-outer quadrant of right breast in female, estrogen receptor positive (HCC)  12/18/2019 Cancer Staging   Staging form: Breast, AJCC 8th Edition - Clinical stage from 12/18/2019: Stage IB (cT2, cN0, cM0, G3, ER+, PR+, HER2+) - Signed by Brandy Beckwith, MD on 01/19/2020   01/08/2020 Initial Diagnosis   Breast cancer, right (HCC)   01/28/2020 - 03/26/2020 Chemotherapy    Patient is on Treatment Plan: BREAST TRASTUZUMAB + PERTUZUMAB Q21D X 11 CYCLES      06/01/2020 - 06/01/2020 Chemotherapy         06/17/2020 - 07/29/2020 Chemotherapy         09/17/2020 - 04/16/2021 Chemotherapy   Patient is on Treatment Plan : BREAST Trastuzumab q21d X 11 Cycles     01/31/2023 -  Chemotherapy   Patient is on Treatment Plan : BREAST MAINTENANCE Trastuzumab SQ (600) D1 q21d x 13 cycles     Malignant neoplasm metastatic to bone (HCC)  11/10/2022 Initial Diagnosis   Malignant neoplasm metastatic to bone (HCC)   01/31/2023 -  Chemotherapy   Patient is on Treatment Plan : BREAST MAINTENANCE Trastuzumab SQ (600) D1 q21d x 13 cycles        INTERVAL HISTORY:  Brandy Jacobs is an 89 year old woman with Stage IB HER2 positive, hormone positive breast cancer diagnosed in November of 2021 and treated with neoadjuvant chemotherapy with THP. Postop she received 4 cycles of Kadcyla followed by trastuzumab and pertuzumab.  She was found to have widespread bone metastases in October of 2024 with epidural extension at T7. The patient completed palliative radiation to T-spine and right shoulder with Dr. Thersa Salt on 12/28/2022. Patient states that she feels well but complains of constipation. She is now able to use her right arm to some degree and move about better. She is using the fentanyl patch 12 mcg and requires 2 tramadol pills daily. She has had her first dose of treatment with SQ trastuzumab in early January 2025 and tolerated this well. Her day 1 cycle 2 of Herceptin/Hylecta is scheduled on 02/21/2023. She has a WBC of 4.0, a low hemoglobin of 10.5, and platelet count of 177,000. Her CMP is normal other than a low total protein of 6.4 and elevated alkaline phosphatase of 197 up from 150. I will see her back in 3 weeks with CBC, CMP, and CA 27.29.   She denies signs of infection such as sore throat, sinus drainage, cough, or urinary symptoms.  She denies fevers or recurrent chills. She denies nausea, vomiting, chest pain, dyspnea or cough. Her appetite is good and her weight has decreased 4 pounds over last month . This patient is accompanied in the office  by her daughter.   REVIEW OF SYSTEMS:  Review of Systems  Constitutional:  Positive for fatigue. Negative for appetite change, chills, diaphoresis, fever and unexpected weight change.  HENT:  Negative.  Negative for hearing loss, lump/mass, mouth sores, nosebleeds, sore throat, tinnitus, trouble swallowing and voice change.   Eyes: Negative.  Negative for eye problems and icterus.  Respiratory: Negative.  Negative for chest tightness, cough, hemoptysis, shortness of breath and wheezing.   Cardiovascular: Negative.  Negative for chest pain, leg swelling and palpitations.  Gastrointestinal:  Positive for constipation. Negative for abdominal distention, abdominal pain, blood in stool, diarrhea, nausea, rectal pain and vomiting.  Endocrine: Negative.   Genitourinary:  Negative.  Negative for bladder incontinence, difficulty urinating, dyspareunia, dysuria, frequency, hematuria, menstrual problem, nocturia, pelvic pain, vaginal bleeding and vaginal discharge.   Musculoskeletal:  Positive for arthralgias, back pain and gait problem (wheelchair). Negative for flank pain, myalgias, neck pain and neck stiffness.  Skin: Negative.  Negative for itching, rash and wound.  Neurological:  Positive for extremity weakness and gait problem (wheelchair). Negative for dizziness, headaches, light-headedness, numbness, seizures and speech difficulty.  Hematological: Negative.  Negative for adenopathy. Does not bruise/bleed easily.  Psychiatric/Behavioral: Negative.  Negative for confusion, decreased concentration, depression, sleep disturbance and suicidal ideas. The patient is not nervous/anxious.    VITALS:  Blood pressure 128/64, pulse 68, temperature 98.4 F (36.9 C), temperature source Oral, resp. rate 18, height 5' 4.5" (1.638 m), weight 133 lb 3.2 oz (60.4 kg), SpO2 100%.  Wt Readings from Last 3 Encounters:  02/21/23 133 lb 3.2 oz (60.4 kg)  01/31/23 138 lb (62.6 kg)  01/20/23 137 lb 6.4 oz (62.3 kg)    Body mass index is 22.51 kg/m.  Performance status (ECOG): 2 - Symptomatic, <50% confined to bed  PHYSICAL EXAM:  Physical Exam Vitals and nursing note reviewed. Exam conducted with a chaperone present.  Constitutional:      General: She is not in acute distress.    Appearance: Normal appearance. She is well-developed and normal weight. She is not ill-appearing, toxic-appearing or diaphoretic.  HENT:     Head: Normocephalic and atraumatic.     Right Ear: Tympanic membrane, ear canal and external ear normal. There is no impacted cerumen.     Left Ear: Tympanic membrane, ear canal and external ear normal. There is no impacted cerumen.     Nose: Nose normal. No congestion or rhinorrhea.     Mouth/Throat:     Mouth: Mucous membranes are moist.     Pharynx:  Oropharynx is clear. No oropharyngeal exudate or posterior oropharyngeal erythema.  Eyes:     General: No scleral icterus.       Right eye: No discharge.        Left eye: No discharge.     Extraocular Movements: Extraocular movements intact.     Conjunctiva/sclera: Conjunctivae normal.     Pupils: Pupils are equal, round, and reactive to light.  Neck:     Vascular: No carotid bruit.  Cardiovascular:     Rate and Rhythm: Normal rate and regular rhythm.     Pulses: Normal pulses.     Heart sounds: Normal heart sounds. No murmur heard.    No friction rub. No gallop.  Pulmonary:     Effort: Pulmonary effort is normal. No respiratory distress.     Breath sounds: Normal breath sounds. No stridor. No wheezing, rhonchi or rales.  Chest:     Chest wall: No tenderness.  Abdominal:  General: Bowel sounds are normal. There is no distension.     Palpations: Abdomen is soft. There is no hepatomegaly, splenomegaly or mass.     Tenderness: There is no abdominal tenderness. There is no right CVA tenderness, left CVA tenderness, guarding or rebound.     Hernia: No hernia is present.  Musculoskeletal:        General: No swelling, tenderness, deformity or signs of injury. Normal range of motion.     Cervical back: Normal range of motion and neck supple. No rigidity or tenderness.     Right lower leg: Edema (minimal) present.     Left lower leg: Edema (minimal) present.  Lymphadenopathy:     Cervical: No cervical adenopathy.     Right cervical: No superficial, deep or posterior cervical adenopathy.    Left cervical: No superficial, deep or posterior cervical adenopathy.     Upper Body:     Right upper body: No supraclavicular, axillary or pectoral adenopathy.     Left upper body: No supraclavicular, axillary or pectoral adenopathy.  Skin:    General: Skin is warm and dry.     Coloration: Skin is not jaundiced or pale.     Findings: No bruising, erythema, lesion or rash.  Neurological:      General: No focal deficit present.     Mental Status: She is alert and oriented to person, place, and time. Mental status is at baseline.     Cranial Nerves: No cranial nerve deficit.     Sensory: No sensory deficit.     Motor: No weakness.     Coordination: Coordination normal.     Gait: Gait normal.     Deep Tendon Reflexes: Reflexes normal.  Psychiatric:        Mood and Affect: Mood normal.        Behavior: Behavior normal.        Thought Content: Thought content normal.        Judgment: Judgment normal.    LABS:      Latest Ref Rng & Units 02/21/2023    1:03 PM 01/31/2023    1:22 PM 01/20/2023    1:14 PM  CBC  WBC 4.0 - 10.5 K/uL 4.8  4.2  3.9   Hemoglobin 12.0 - 15.0 g/dL 47.8  29.5  62.1   Hematocrit 36.0 - 46.0 % 33.1  32.6  32.9   Platelets 150 - 400 K/uL 177  206  187       Latest Ref Rng & Units 02/21/2023    1:03 PM 01/31/2023    1:22 PM 01/20/2023    1:14 PM  CMP  Glucose 70 - 99 mg/dL 308  657  846   BUN 8 - 23 mg/dL 11  12  11    Creatinine 0.44 - 1.00 mg/dL 9.62  9.52  8.41   Sodium 135 - 145 mmol/L 138  136  138   Potassium 3.5 - 5.1 mmol/L 4.2  3.9  4.1   Chloride 98 - 111 mmol/L 100  100  100   CO2 22 - 32 mmol/L 27  25  29    Calcium 8.9 - 10.3 mg/dL 32.4  40.1  02.7   Total Protein 6.5 - 8.1 g/dL 6.4  6.5  6.6   Total Bilirubin 0.0 - 1.2 mg/dL 0.9  1.0  1.0   Alkaline Phos 38 - 126 U/L 197  150  154   AST 15 - 41 U/L 25  27  27  ALT 0 - 44 U/L 9  7  5     Component Ref Range & Units (hover) 2 wk ago 1 mo ago 2 mo ago  CA 27.29 130.8 High  117.2 High  CM 111.7 High  C     Lab Results  Component Value Date   CEA 10.17 (H) 01/31/2023   /  CEA [facility])  Date Value Ref Range Status  01/31/2023 10.17 (H) 0.00 - 5.00 ng/mL Final    Comment:    (NOTE) This test was performed using Beckman Coulter's paramagnetic chemiluminescent immunoassay. Values obtained from different assay methods cannot be used interchangeably. Please note that up to 8%  of patients who smoke may see values 5.1-10.0 ng/ml and 1% of patients who smoke may see CEA levels >10.0 ng/ml. Performed at [facility], [address] [place], [name] [address]    No results found for: "PSA1" No results found for: "WUJ811" No results found for: "BJY782"  Lab Results  Component Value Date   TOTALPROTELP 6.5 12/01/2022   ALBUMINELP 3.9 12/01/2022   A1GS 0.3 12/01/2022   A2GS 0.7 12/01/2022   BETS 0.9 12/01/2022   GAMS 0.8 12/01/2022   MSPIKE Not Observed 12/01/2022   SPEI Comment 12/01/2022   Lab Results  Component Value Date   TIBC 298 10/27/2022   FERRITIN 96 10/27/2022   IRONPCTSAT 24 10/27/2022   Lab Results  Component Value Date   VITAMINB12 501 01/13/2023   No results found for: "LDH"  STUDIES:  Her PET scan on 11-23-22 revealed the following:      HISTORY:   Past Medical History:  Diagnosis Date   Anemia 10/26/2022   Arthritis    osteoarthritis   Breast cancer (HCC)    Diabetes mellitus without complication (HCC)    not on medication   History of CVA (cerebrovascular accident)    Hypertension    Multiple thyroid nodules    benign   Right arm weakness 10/26/2022   Upper back pain 10/26/2022   Word finding difficulty 10/26/2022   Family History  Problem Relation Age of Onset   Colon cancer Father        48s   Breast cancer Sister        70-60   Colon cancer Brother        74s   Prostate cancer Brother        late 86s   Breast cancer Half-Sister        late 50s early 59s    Social History:  reports that she has never smoked. She has never used smokeless tobacco. She reports that she does not currently use alcohol. She reports that she does not use drugs.The patient is accompanied by her daughter today.  Allergies: No Known Allergies  Current Medications: Current Outpatient Medications  Medication Sig Dispense Refill   acetaminophen (TYLENOL) 500 MG tablet Take 500 mg by mouth at bedtime.      aspirin EC 81 MG tablet Take 81 mg by mouth daily. Swallow whole.     atorvastatin (LIPITOR) 40 MG tablet Take 40 mg by mouth daily.     cyanocobalamin (VITAMIN B12) 500 MCG tablet Take 1 tablet (500 mcg total) by mouth daily.     docusate sodium (COLACE) 100 MG capsule Take 100 mg by mouth 2 (two) times daily.     fentaNYL (DURAGESIC) 12 MCG/HR Place 1 patch onto the skin every 3 (three) days. 10 patch 0   gabapentin (NEURONTIN) 100 MG capsule  Take 100 mg by mouth at bedtime as needed.     letrozole (FEMARA) 2.5 MG tablet Take 1 tablet (2.5 mg total) by mouth daily. 90 tablet 1   losartan (COZAAR) 50 MG tablet Take 50 mg by mouth daily.     ondansetron (ZOFRAN-ODT) 4 MG disintegrating tablet Take 1 tablet (4 mg total) by mouth every 6 (six) hours as needed for nausea or vomiting. 60 tablet 3   traMADol (ULTRAM) 50 MG tablet Take 1 tablet (50 mg total) by mouth every 6 (six) hours as needed. 100 tablet 0   triamcinolone cream (KENALOG) 0.1 % SMARTSIG:1 Application Topical 2-3 Times Daily     Vitamin D3 (VITAMIN D) 25 MCG tablet Take 1,000 Units by mouth daily.     No current facility-administered medications for this visit.    I,Jasmine M Lassiter,acting as a scribe for Brandy Beckwith, MD.,have documented all relevant documentation on the behalf of Brandy Beckwith, MD,as directed by  Brandy Beckwith, MD while in the presence of Brandy Beckwith, MD.

## 2023-02-21 ENCOUNTER — Other Ambulatory Visit: Payer: Self-pay | Admitting: Oncology

## 2023-02-21 ENCOUNTER — Encounter: Payer: Self-pay | Admitting: Oncology

## 2023-02-21 ENCOUNTER — Inpatient Hospital Stay: Payer: Medicare Other

## 2023-02-21 ENCOUNTER — Inpatient Hospital Stay (HOSPITAL_BASED_OUTPATIENT_CLINIC_OR_DEPARTMENT_OTHER): Payer: Medicare Other | Admitting: Oncology

## 2023-02-21 ENCOUNTER — Telehealth: Payer: Self-pay | Admitting: Oncology

## 2023-02-21 VITALS — BP 115/53 | HR 62 | Resp 16

## 2023-02-21 VITALS — BP 128/64 | HR 68 | Temp 98.4°F | Resp 18 | Ht 64.5 in | Wt 133.2 lb

## 2023-02-21 DIAGNOSIS — Z5111 Encounter for antineoplastic chemotherapy: Secondary | ICD-10-CM | POA: Diagnosis not present

## 2023-02-21 DIAGNOSIS — Z170 Estrogen receptor positive status [ER+]: Secondary | ICD-10-CM

## 2023-02-21 DIAGNOSIS — C7951 Secondary malignant neoplasm of bone: Secondary | ICD-10-CM

## 2023-02-21 DIAGNOSIS — C50411 Malignant neoplasm of upper-outer quadrant of right female breast: Secondary | ICD-10-CM

## 2023-02-21 LAB — CMP (CANCER CENTER ONLY)
ALT: 9 U/L (ref 0–44)
AST: 25 U/L (ref 15–41)
Albumin: 3.8 g/dL (ref 3.5–5.0)
Alkaline Phosphatase: 197 U/L — ABNORMAL HIGH (ref 38–126)
Anion gap: 11 (ref 5–15)
BUN: 11 mg/dL (ref 8–23)
CO2: 27 mmol/L (ref 22–32)
Calcium: 10 mg/dL (ref 8.9–10.3)
Chloride: 100 mmol/L (ref 98–111)
Creatinine: 0.76 mg/dL (ref 0.44–1.00)
GFR, Estimated: >60 mL/min (ref >60)
Glucose, Bld: 121 mg/dL — ABNORMAL HIGH (ref 70–99)
Potassium: 4.2 mmol/L (ref 3.5–5.1)
Sodium: 138 mmol/L (ref 135–145)
Total Bilirubin: 0.9 mg/dL (ref 0.0–1.2)
Total Protein: 6.4 g/dL — ABNORMAL LOW (ref 6.5–8.1)

## 2023-02-21 LAB — CBC WITH DIFFERENTIAL (CANCER CENTER ONLY)
Abs Immature Granulocytes: 0.04 10*3/uL (ref 0.00–0.07)
Basophils Absolute: 0.1 10*3/uL (ref 0.0–0.1)
Basophils Relative: 1 %
Eosinophils Absolute: 0.2 10*3/uL (ref 0.0–0.5)
Eosinophils Relative: 4 %
HCT: 33.1 % — ABNORMAL LOW (ref 36.0–46.0)
Hemoglobin: 10.5 g/dL — ABNORMAL LOW (ref 12.0–15.0)
Immature Granulocytes: 1 %
Lymphocytes Relative: 10 %
Lymphs Abs: 0.5 10*3/uL — ABNORMAL LOW (ref 0.7–4.0)
MCH: 29.9 pg (ref 26.0–34.0)
MCHC: 31.7 g/dL (ref 30.0–36.0)
MCV: 94.3 fL (ref 80.0–100.0)
Monocytes Absolute: 0.5 10*3/uL (ref 0.1–1.0)
Monocytes Relative: 9 %
Neutro Abs: 3.6 10*3/uL (ref 1.7–7.7)
Neutrophils Relative %: 75 %
Platelet Count: 177 10*3/uL (ref 150–400)
RBC: 3.51 MIL/uL — ABNORMAL LOW (ref 3.87–5.11)
RDW: 14.7 % (ref 11.5–15.5)
WBC Count: 4.8 10*3/uL (ref 4.0–10.5)
nRBC: 0 % (ref 0.0–0.2)
nRBC: 0 /100{WBCs}

## 2023-02-21 MED ORDER — DIPHENHYDRAMINE HCL 25 MG PO CAPS
50.0000 mg | ORAL_CAPSULE | Freq: Once | ORAL | Status: AC
  Administered 2023-02-21: 50 mg via ORAL
  Filled 2023-02-21: qty 2

## 2023-02-21 MED ORDER — TRASTUZUMAB-HYALURONIDASE-OYSK 600-10000 MG-UNT/5ML ~~LOC~~ SOLN
600.0000 mg | Freq: Once | SUBCUTANEOUS | Status: AC
  Administered 2023-02-21: 600 mg via SUBCUTANEOUS
  Filled 2023-02-21: qty 5

## 2023-02-21 MED ORDER — ACETAMINOPHEN 325 MG PO TABS
650.0000 mg | ORAL_TABLET | Freq: Once | ORAL | Status: AC
Start: 2023-02-21 — End: 2023-02-21
  Administered 2023-02-21: 650 mg via ORAL
  Filled 2023-02-21: qty 2

## 2023-02-21 NOTE — Telephone Encounter (Signed)
Patient has been scheduled for follow-up visit per 02/21/23 LOS.  Pt given an appt calendar with date and time.

## 2023-02-21 NOTE — Patient Instructions (Signed)
[facility] [place] - A DEPT OF [facility]  Discharge Instructions: Thank you for choosing [facility] to provide your oncology and hematology care.  If you have a lab appointment with the Cancer Center, please go directly to the Cancer Center and check in at the registration area.   Wear comfortable clothing and clothing appropriate for easy access to any Portacath or PICC line.   We strive to give you quality time with your provider. You may need to reschedule your appointment if you arrive late (15 or more minutes).  Arriving late affects you and other patients whose appointments are after yours.  Also, if you miss three or more appointments without notifying the office, you may be dismissed from the clinic at the provider's discretion.      For prescription refill requests, have your pharmacy contact our office and allow 72 hours for refills to be completed.    Today you received the following chemotherapy and/or immunotherapy agents Herceptin Hylecta      To help prevent nausea and vomiting after your treatment, we encourage you to take your nausea medication as directed.  BELOW ARE SYMPTOMS THAT SHOULD BE REPORTED IMMEDIATELY: *FEVER GREATER THAN 100.4 F (38 C) OR HIGHER *CHILLS OR SWEATING *NAUSEA AND VOMITING THAT IS NOT CONTROLLED WITH YOUR NAUSEA MEDICATION *UNUSUAL SHORTNESS OF BREATH *UNUSUAL BRUISING OR BLEEDING *URINARY PROBLEMS (pain or burning when urinating, or frequent urination) *BOWEL PROBLEMS (unusual diarrhea, constipation, pain near the anus) TENDERNESS IN MOUTH AND THROAT WITH OR WITHOUT PRESENCE OF ULCERS (sore throat, sores in mouth, or a toothache) UNUSUAL RASH, SWELLING OR PAIN  UNUSUAL VAGINAL DISCHARGE OR ITCHING   Items with * indicate a potential emergency and should be followed up as soon as possible or go to the Emergency Department if any problems should occur.  Please show the CHEMOTHERAPY ALERT CARD or  IMMUNOTHERAPY ALERT CARD at check-in to the Emergency Department and triage nurse.  Should you have questions after your visit or need to cancel or reschedule your appointment, please contact [facility] [place] - [facility]  Dept: [phone]  and follow the prompts.  Office hours are 8:00 a.m. to 4:30 p.m. Monday - Friday. Please note that voicemails left after 4:00 p.m. may not be returned until the following business day.  We are closed weekends and major holidays. You have access to a nurse at all times for urgent questions. Please call the main number to the clinic Dept: [phone] and follow the prompts.  For any non-urgent questions, you may also contact your provider using MyChart. We now offer e-Visits for anyone 34 and older to request care online for non-urgent symptoms. For details visit [email or web address].   Also download the MyChart app! Go to the app store, search "MyChart", open the app, select [place], and log in with your MyChart username and password.

## 2023-02-22 ENCOUNTER — Encounter: Payer: Self-pay | Admitting: Oncology

## 2023-02-22 ENCOUNTER — Other Ambulatory Visit: Payer: Self-pay | Admitting: Pharmacist

## 2023-02-22 ENCOUNTER — Other Ambulatory Visit: Payer: Self-pay

## 2023-02-26 ENCOUNTER — Encounter: Payer: Self-pay | Admitting: Oncology

## 2023-03-02 ENCOUNTER — Other Ambulatory Visit: Payer: Self-pay | Admitting: Oncology

## 2023-03-02 DIAGNOSIS — C7951 Secondary malignant neoplasm of bone: Secondary | ICD-10-CM

## 2023-03-04 ENCOUNTER — Other Ambulatory Visit: Payer: Self-pay

## 2023-03-10 NOTE — Progress Notes (Signed)
 [name]  [address] 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  She is status post neoadjuvant trastuzumab/pertuzumab/paclitaxel. She had an excellent response to this, with just a tiny residual invasive carcinoma. She received 4 cycles of adjuvant Kadcyla but this was discontinued due to toxicities. She went on to have single agent trastuzumab to complete 1 year of HER2 targeted therapy. Right diagnostic mammogram in June did not reveal any evidence of malignancy.  She had benign postlumpectomy changes and benign, stable, biopsied right breast asymmetry.    Malignant neoplasm metastatic to bone Her MRI scans were suspicious for recurrent breast cancer. This was confirmed bone metastases as of November, 2024. PET Scan revealed multiple areas of metastatic bone disease. MRI thoracic imaging on 11/28/2022 revealed excessive osseous metastasis seen throughout the thoracic and lumbar spine. There is likely evidence of epidural extension of tumor at the T7 level with likely at least mild spinal canal narrowing. She completed palliative radiation to right right shoulder and T-spine on 12/28/2022 with Dr. Thersa Salt, which gave her relief of that pain. She was still having constant pain which was disturbing her sleep and limiting her activities. We started the Herceptin and her pain is well controlled after 2 doses.   Right arm weakness CT of thoracic spine on 11/01/2022 showed there is likely evidence of epidural extension of tumor at the T7 level with likely at least mild spinal canal narrowing. She has received radiation to that area with some improvement. She is also on low-doses of analgesics and  Herceptin.  Word finding difficulty Difficulty with word finding, in addition to the right arm weakness. MRI of the brain done on 11/01/2022 shows no acute intracranial abnormality and moderate to severe chronic small vessel ischemic disease with multiple chronic infarctions.    Anemia Mild anemia, stable. On 10/27/2022 her vitamin B-12, folate, ferritin, iron, and TIBC were all normal.    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  Plan: She now takes Tramadol 50 mg 3 tablets a day in addition to the fentanyl patch 12 mcg and her pain is well controlled. Her day 1 cycle 3 of trastuzumab SQ is scheduled on 03/14/2023. She has a WBC of 4.9, a low hemoglobin of 10.6 up from 10.5, and platelet count of 180,000. Her CMP is normal other than a high-normal calcium of 10.4 and alkaline phosphatase of 182 improved from 197. Her CA 27.29 today is pending. I will see her back in 3 weeks with CBC and CMP.   I provided 15 minutes of face-to-face time during this this encounter and > 50% was spent counseling as documented under my assessment and plan.   Brandy Beckwith, MD  [facility] - A DEPT OF [facility] [address] [name] [address] Dept: [phone] Dept Fax: [phone]   No orders of the defined types were placed in this encounter.   CHIEF COMPLAINT:  CC: Stage IB HER2 receptor positive breast cancer  Current Treatment: Observation  HISTORY OF PRESENT ILLNESS:   Oncology History  Malignant neoplasm of upper-outer quadrant of right breast in female, estrogen receptor positive (HCC)  12/18/2019 Cancer Staging  Staging form: Breast, AJCC 8th Edition - Clinical stage from 12/18/2019: Stage IB (cT2, cN0, cM0, G3, ER+, PR+, HER2+) - Signed by Brandy Beckwith, MD on 01/19/2020   01/08/2020 Initial Diagnosis   Breast cancer, right (HCC)   01/28/2020 -  03/26/2020 Chemotherapy    Patient is on Treatment Plan: BREAST TRASTUZUMAB + PERTUZUMAB Q21D X 11 CYCLES      06/01/2020 - 06/01/2020 Chemotherapy         06/17/2020 - 07/29/2020 Chemotherapy         09/17/2020 - 04/16/2021 Chemotherapy   Patient is on Treatment Plan : BREAST Trastuzumab q21d X 11 Cycles     01/31/2023 -  Chemotherapy   Patient is on Treatment Plan : BREAST MAINTENANCE Trastuzumab SQ (600) D1 q21d x 13 cycles     Malignant neoplasm metastatic to bone (HCC)  11/10/2022 Initial Diagnosis   Malignant neoplasm metastatic to bone (HCC)   01/31/2023 -  Chemotherapy   Patient is on Treatment Plan : BREAST MAINTENANCE Trastuzumab SQ (600) D1 q21d x 13 cycles        INTERVAL HISTORY:  Brandy Jacobs is an 89 year old woman with Stage IB HER2 positive, hormone positive breast cancer diagnosed in November of 2021 and treated with neoadjuvant chemotherapy with THP. Postop she received 4 cycles of Kadcyla followed by trastuzumab and pertuzumab for a total of 1 year post-op. She was found to have widespread bone metastases in October, 2024 with epidural extension at T7. The patient completed palliative radiation to T-spine and right shoulder with Dr. Thersa Salt on 12/28/2022. Patient states that she feels well and states that her pain is well managed. She now takes Tramadol 50 mg 3 tablets a day in addition to the fentanyl patch 12 mcg. Her day 1 cycle 3 of trastuzumab SQ is scheduled on 03/14/2023. She has a WBC of 4.9, a low hemoglobin of 10.6 up from 10.5, and platelet count of 180,000. Her CMP is normal other than a high-normal calcium of 10.4 and alkaline phosphatase of 182 improved from 197. Her CA 27.29 today is pending. I will see her back in 3 weeks with CBC and CMP.  She denies signs of infection such as sore throat, sinus drainage, cough, or urinary symptoms.  She denies fevers or recurrent chills. She denies nausea, vomiting, chest pain, dyspnea or cough. Her appetite is ok and her weight  has decreased 5 pounds over last 3 weeks . This patient is accompanied in the office by her daughter.   REVIEW OF SYSTEMS:  Review of Systems  Constitutional:  Positive for fatigue. Negative for appetite change, chills, diaphoresis, fever and unexpected weight change.  HENT:  Negative.  Negative for hearing loss, lump/mass, mouth sores, nosebleeds, sore throat, tinnitus, trouble swallowing and voice change.   Eyes: Negative.  Negative for eye problems and icterus.  Respiratory: Negative.  Negative for chest tightness, cough, hemoptysis, shortness of breath and wheezing.   Cardiovascular: Negative.  Negative for chest pain, leg swelling and palpitations.  Gastrointestinal:  Positive for constipation. Negative for abdominal distention, abdominal pain, blood in stool, diarrhea, nausea, rectal pain and vomiting.  Endocrine: Negative.   Genitourinary: Negative.  Negative for bladder incontinence, difficulty urinating, dyspareunia, dysuria, frequency, hematuria, menstrual problem, nocturia, pelvic pain, vaginal bleeding and vaginal discharge.   Musculoskeletal:  Positive for arthralgias, back pain (imporving) and gait problem (wheelchair). Negative for flank pain, myalgias, neck pain and neck stiffness.  Skin: Negative.  Negative for itching, rash and wound.  Neurological:  Positive for extremity weakness and gait problem (wheelchair). Negative for dizziness, headaches, light-headedness, numbness, seizures and speech difficulty.  Hematological: Negative.  Negative for adenopathy. Does not bruise/bleed easily.  Psychiatric/Behavioral: Negative.  Negative for confusion, decreased concentration, depression, sleep disturbance and suicidal ideas. The patient is not nervous/anxious.    VITALS:  Blood pressure 124/60, pulse 69, temperature 98.3 F (36.8 C), temperature source Oral, resp. rate 18, height 5' 4.5" (1.638 m), weight 128 lb 11.2 oz (58.4 kg), SpO2 97%.  Wt Readings from Last 3 Encounters:   03/14/23 128 lb 11.2 oz (58.4 kg)  02/21/23 133 lb 3.2 oz (60.4 kg)  01/31/23 138 lb (62.6 kg)    Body mass index is 21.75 kg/m.  Performance status (ECOG): 2 - Symptomatic, <50% confined to bed  PHYSICAL EXAM:  Physical Exam Vitals and nursing note reviewed. Exam conducted with a chaperone present.  Constitutional:      General: She is not in acute distress.    Appearance: Normal appearance. She is well-developed and normal weight. She is not ill-appearing, toxic-appearing or diaphoretic.  HENT:     Head: Normocephalic and atraumatic.     Right Ear: Tympanic membrane, ear canal and external ear normal. There is no impacted cerumen.     Left Ear: Tympanic membrane, ear canal and external ear normal. There is no impacted cerumen.     Nose: Nose normal. No congestion or rhinorrhea.     Mouth/Throat:     Mouth: Mucous membranes are moist.     Pharynx: Oropharynx is clear. No oropharyngeal exudate or posterior oropharyngeal erythema.  Eyes:     General: No scleral icterus.       Right eye: No discharge.        Left eye: No discharge.     Extraocular Movements: Extraocular movements intact.     Conjunctiva/sclera: Conjunctivae normal.     Pupils: Pupils are equal, round, and reactive to light.  Neck:     Vascular: No carotid bruit.  Cardiovascular:     Rate and Rhythm: Normal rate and regular rhythm.     Pulses: Normal pulses.     Heart sounds: Normal heart sounds. No murmur heard.    No friction rub. No gallop.  Pulmonary:     Effort: Pulmonary effort is normal. No respiratory distress.     Breath sounds: Normal breath sounds. No stridor. No wheezing, rhonchi or rales.  Chest:     Chest wall: No tenderness.  Abdominal:     General: Bowel sounds are normal. There is no distension.     Palpations: Abdomen is soft. There is no hepatomegaly, splenomegaly or mass.     Tenderness: There is no abdominal tenderness. There is no right CVA tenderness, left CVA tenderness, guarding or  rebound.     Hernia: No hernia is present.  Musculoskeletal:        General: No swelling, tenderness, deformity or signs of injury. Normal range of motion.     Cervical back: Normal range of motion and neck supple. No rigidity or tenderness.     Right lower leg: No edema.     Left lower leg: No edema.  Lymphadenopathy:     Cervical: No cervical adenopathy.     Right cervical: No superficial, deep or posterior cervical adenopathy.    Left cervical: No superficial, deep or posterior cervical adenopathy.     Upper Body:     Right upper body: No supraclavicular, axillary or pectoral adenopathy.  Left upper body: No supraclavicular, axillary or pectoral adenopathy.  Skin:    General: Skin is warm and dry.     Coloration: Skin is not jaundiced or pale.     Findings: No bruising, erythema, lesion or rash.  Neurological:     General: No focal deficit present.     Mental Status: She is alert and oriented to person, place, and time. Mental status is at baseline.     Cranial Nerves: No cranial nerve deficit.     Sensory: No sensory deficit.     Motor: No weakness.     Coordination: Coordination normal.     Gait: Gait normal.     Deep Tendon Reflexes: Reflexes normal.  Psychiatric:        Mood and Affect: Mood normal.        Behavior: Behavior normal.        Thought Content: Thought content normal.        Judgment: Judgment normal.    LABS:      Latest Ref Rng & Units 03/14/2023   12:55 PM 02/21/2023    1:03 PM 01/31/2023    1:22 PM  CBC  WBC 4.0 - 10.5 K/uL 4.9  4.8  4.2   Hemoglobin 12.0 - 15.0 g/dL 60.4  54.0  98.1   Hematocrit 36.0 - 46.0 % 32.5  33.1  32.6   Platelets 150 - 400 K/uL 180  177  206       Latest Ref Rng & Units 03/14/2023   12:55 PM 02/21/2023    1:03 PM 01/31/2023    1:22 PM  CMP  Glucose 70 - 99 mg/dL 191  478  295   BUN 8 - 23 mg/dL 16  11  12    Creatinine 0.44 - 1.00 mg/dL 6.21  3.08  6.57   Sodium 135 - 145 mmol/L 137  138  136   Potassium 3.5 - 5.1  mmol/L 4.1  4.2  3.9   Chloride 98 - 111 mmol/L 102  100  100   CO2 22 - 32 mmol/L 26  27  25    Calcium 8.9 - 10.3 mg/dL 84.6  96.2  95.2   Total Protein 6.5 - 8.1 g/dL 6.5  6.4  6.5   Total Bilirubin 0.0 - 1.2 mg/dL 0.9  0.9  1.0   Alkaline Phos 38 - 126 U/L 182  197  150   AST 15 - 41 U/L 21  25  27    ALT 0 - 44 U/L 6  9  7     Component Ref Range & Units (hover) 01/31/2023 1 mo ago 2 mo ago  CA 27.29 130.8 High  117.2 High  CM 111.7 High  C     Lab Results  Component Value Date   CEA 10.17 (H) 01/31/2023   /  CEA ([facility])  Date Value Ref Range Status  01/31/2023 10.17 (H) 0.00 - 5.00 ng/mL Final    Comment:    (NOTE) This test was performed using Beckman Coulter's paramagnetic chemiluminescent immunoassay. Values obtained from different assay methods cannot be used interchangeably. Please note that up to 8% of patients who smoke may see values 5.1-10.0 ng/ml and 1% of patients who smoke may see CEA levels >10.0 ng/ml. Performed at [facility], [address], [place], [name] [address]    No results found for: "PSA1" No results found for: "GMW102" No results found for: "VOZ366"  Lab Results  Component Value Date   TOTALPROTELP 6.5  12/01/2022   ALBUMINELP 3.9 12/01/2022   A1GS 0.3 12/01/2022   A2GS 0.7 12/01/2022   BETS 0.9 12/01/2022   GAMS 0.8 12/01/2022   MSPIKE Not Observed 12/01/2022   SPEI Comment 12/01/2022   Lab Results  Component Value Date   TIBC 298 10/27/2022   FERRITIN 96 10/27/2022   IRONPCTSAT 24 10/27/2022   Lab Results  Component Value Date   VITAMINB12 501 01/13/2023   No results found for: "LDH"  STUDIES:       HISTORY:   Past Medical History:  Diagnosis Date   Anemia 10/26/2022   Arthritis    osteoarthritis   Breast cancer (HCC)    Diabetes mellitus without complication (HCC)    not on medication   History of CVA (cerebrovascular accident)    Hypertension    Multiple thyroid nodules    benign   Right  arm weakness 10/26/2022   Upper back pain 10/26/2022   Word finding difficulty 10/26/2022   Family History  Problem Relation Age of Onset   Colon cancer Father        72s   Breast cancer Sister        77-60   Colon cancer Brother        76s   Prostate cancer Brother        late 40s   Breast cancer Half-Sister        late 64s early 33s    Social History:  reports that she has never smoked. She has never used smokeless tobacco. She reports that she does not currently use alcohol. She reports that she does not use drugs.The patient is accompanied by her daughter today.  Allergies: No Known Allergies  Current Medications: Current Outpatient Medications  Medication Sig Dispense Refill   acetaminophen (TYLENOL) 500 MG tablet Take 500 mg by mouth at bedtime.     aspirin EC 81 MG tablet Take 81 mg by mouth daily. Swallow whole.     atorvastatin (LIPITOR) 40 MG tablet Take 40 mg by mouth daily.     cyanocobalamin (VITAMIN B12) 500 MCG tablet Take 1 tablet (500 mcg total) by mouth daily.     docusate sodium (COLACE) 100 MG capsule Take 100 mg by mouth 2 (two) times daily.     fentaNYL (DURAGESIC) 12 MCG/HR Place 1 patch onto the skin every 3 (three) days. 10 patch 0   gabapentin (NEURONTIN) 100 MG capsule Take 100 mg by mouth at bedtime as needed.     letrozole (FEMARA) 2.5 MG tablet Take 1 tablet (2.5 mg total) by mouth daily. 90 tablet 1   losartan (COZAAR) 50 MG tablet Take 50 mg by mouth daily.     ondansetron (ZOFRAN-ODT) 4 MG disintegrating tablet Take 1 tablet (4 mg total) by mouth every 6 (six) hours as needed for nausea or vomiting. 60 tablet 3   traMADol (ULTRAM) 50 MG tablet Take 1 tablet (50 mg total) by mouth every 6 (six) hours as needed. 100 tablet 0   triamcinolone cream (KENALOG) 0.1 % SMARTSIG:1 Application Topical 2-3 Times Daily     Vitamin D3 (VITAMIN D) 25 MCG tablet Take 1,000 Units by mouth daily.     No current facility-administered medications for this visit.     I,Jasmine M Lassiter,acting as a scribe for Brandy Beckwith, MD.,have documented all relevant documentation on the behalf of Brandy Beckwith, MD,as directed by  Brandy Beckwith, MD while in the presence of Brandy Beckwith, MD.

## 2023-03-14 ENCOUNTER — Inpatient Hospital Stay: Payer: Medicare Other | Attending: Hematology and Oncology

## 2023-03-14 ENCOUNTER — Telehealth: Payer: Self-pay | Admitting: Oncology

## 2023-03-14 ENCOUNTER — Encounter: Payer: Self-pay | Admitting: Oncology

## 2023-03-14 ENCOUNTER — Inpatient Hospital Stay (HOSPITAL_BASED_OUTPATIENT_CLINIC_OR_DEPARTMENT_OTHER): Payer: Medicare Other | Admitting: Oncology

## 2023-03-14 ENCOUNTER — Inpatient Hospital Stay: Payer: Medicare Other

## 2023-03-14 VITALS — BP 124/60 | HR 69 | Temp 98.3°F | Resp 18 | Ht 64.5 in | Wt 128.7 lb

## 2023-03-14 DIAGNOSIS — Z79899 Other long term (current) drug therapy: Secondary | ICD-10-CM | POA: Diagnosis not present

## 2023-03-14 DIAGNOSIS — Z5111 Encounter for antineoplastic chemotherapy: Secondary | ICD-10-CM | POA: Diagnosis present

## 2023-03-14 DIAGNOSIS — Z170 Estrogen receptor positive status [ER+]: Secondary | ICD-10-CM

## 2023-03-14 DIAGNOSIS — D649 Anemia, unspecified: Secondary | ICD-10-CM | POA: Insufficient documentation

## 2023-03-14 DIAGNOSIS — C7951 Secondary malignant neoplasm of bone: Secondary | ICD-10-CM | POA: Diagnosis not present

## 2023-03-14 DIAGNOSIS — C50411 Malignant neoplasm of upper-outer quadrant of right female breast: Secondary | ICD-10-CM | POA: Insufficient documentation

## 2023-03-14 DIAGNOSIS — Z923 Personal history of irradiation: Secondary | ICD-10-CM | POA: Diagnosis not present

## 2023-03-14 LAB — CMP (CANCER CENTER ONLY)
ALT: 6 U/L (ref 0–44)
AST: 21 U/L (ref 15–41)
Albumin: 3.7 g/dL (ref 3.5–5.0)
Alkaline Phosphatase: 182 U/L — ABNORMAL HIGH (ref 38–126)
Anion gap: 10 (ref 5–15)
BUN: 16 mg/dL (ref 8–23)
CO2: 26 mmol/L (ref 22–32)
Calcium: 10.4 mg/dL — ABNORMAL HIGH (ref 8.9–10.3)
Chloride: 102 mmol/L (ref 98–111)
Creatinine: 0.74 mg/dL (ref 0.44–1.00)
GFR, Estimated: >60 mL/min (ref >60)
Glucose, Bld: 112 mg/dL — ABNORMAL HIGH (ref 70–99)
Potassium: 4.1 mmol/L (ref 3.5–5.1)
Sodium: 137 mmol/L (ref 135–145)
Total Bilirubin: 0.9 mg/dL (ref 0.0–1.2)
Total Protein: 6.5 g/dL (ref 6.5–8.1)

## 2023-03-14 LAB — CBC WITH DIFFERENTIAL (CANCER CENTER ONLY)
Abs Immature Granulocytes: 0.04 10*3/uL (ref 0.00–0.07)
Basophils Absolute: 0.1 10*3/uL (ref 0.0–0.1)
Basophils Relative: 1 %
Eosinophils Absolute: 0.1 10*3/uL (ref 0.0–0.5)
Eosinophils Relative: 3 %
HCT: 32.5 % — ABNORMAL LOW (ref 36.0–46.0)
Hemoglobin: 10.6 g/dL — ABNORMAL LOW (ref 12.0–15.0)
Immature Granulocytes: 1 %
Lymphocytes Relative: 11 %
Lymphs Abs: 0.5 10*3/uL — ABNORMAL LOW (ref 0.7–4.0)
MCH: 30.5 pg (ref 26.0–34.0)
MCHC: 32.6 g/dL (ref 30.0–36.0)
MCV: 93.4 fL (ref 80.0–100.0)
Monocytes Absolute: 0.4 10*3/uL (ref 0.1–1.0)
Monocytes Relative: 8 %
Neutro Abs: 3.7 10*3/uL (ref 1.7–7.7)
Neutrophils Relative %: 76 %
Platelet Count: 180 10*3/uL (ref 150–400)
RBC: 3.48 MIL/uL — ABNORMAL LOW (ref 3.87–5.11)
RDW: 14.6 % (ref 11.5–15.5)
WBC Count: 4.9 10*3/uL (ref 4.0–10.5)
nRBC: 0 % (ref 0.0–0.2)
nRBC: 0 /100{WBCs}

## 2023-03-14 MED ORDER — DIPHENHYDRAMINE HCL 25 MG PO CAPS
50.0000 mg | ORAL_CAPSULE | Freq: Once | ORAL | Status: AC
Start: 2023-03-14 — End: 2023-03-14
  Administered 2023-03-14: 50 mg via ORAL
  Filled 2023-03-14: qty 2

## 2023-03-14 MED ORDER — ACETAMINOPHEN 325 MG PO TABS
650.0000 mg | ORAL_TABLET | Freq: Once | ORAL | Status: AC
Start: 2023-03-14 — End: 2023-03-14
  Administered 2023-03-14: 650 mg via ORAL
  Filled 2023-03-14: qty 2

## 2023-03-14 MED ORDER — TRASTUZUMAB-HYALURONIDASE-OYSK 600-10000 MG-UNT/5ML ~~LOC~~ SOLN
600.0000 mg | Freq: Once | SUBCUTANEOUS | Status: AC
Start: 2023-03-14 — End: 2023-03-14
  Administered 2023-03-14: 600 mg via SUBCUTANEOUS
  Filled 2023-03-14: qty 5

## 2023-03-14 NOTE — Patient Instructions (Signed)
Pertuzumab; Trastuzumab; Hyaluronidase Injection What is this medication? PERTUZUMAB; TRASTUZUMAB; HYALURONIDASE (per TOOZ ue mab; tras TOO zoo mab; hye al ur ON i dase) treats breast cancer. Pertuzumab and trastuzumab work by blocking a protein that causes cancer cells to grow and multiply. This helps to slow or stop the spread of cancer cells. Hyaluronidase works by increasing the absorption of other medications in the body to help them work better. It is a combination medication that contains two monoclonal antibodies. This medicine may be used for other purposes; ask your health care provider or pharmacist if you have questions. COMMON BRAND NAME(S): PHESGO What should I tell my care team before I take this medication? They need to know if you have any of these conditions: Heart failure High blood pressure Irregular heartbeat or rhythm Lung disease An unusual or allergic reaction to pertuzumab, trastuzumab, hyaluronidase, other medications, foods, dyes, or preservatives Pregnant or trying to get pregnant Breast-feeding How should I use this medication? This medication is injected under the skin. It is usually given by your care team in a hospital or clinic setting. It may also be given at home by your care team. Talk to your care team about the use of this medication in children. Special care may be needed. Overdosage: If you think you have taken too much of this medicine contact a poison control center or emergency room at once. NOTE: This medicine is only for you. Do not share this medicine with others. What if I miss a dose? Keep appointments for follow-up doses. It is important not to miss your dose. Call your care team if you are unable to keep an appointment. What may interact with this medication? Certain types of chemotherapy, such as daunorubicin, doxorubicin, epirubicin, idarubicin This list may not describe all possible interactions. Give your health care provider a list of all  the medicines, herbs, non-prescription drugs, or dietary supplements you use. Also tell them if you smoke, drink alcohol, or use illegal drugs. Some items may interact with your medicine. What should I watch for while using this medication? Your condition will be monitored carefully while you are receiving this medication. This medication may make you feel generally unwell. This is not uncommon as chemotherapy can affect healthy cells as well as cancer cells. Report any side effects. Continue your course of treatment even though you feel ill unless your care team tells you to stop. This medication may increase your risk of getting an infection. Call your care team for advice if you get a fever, chills, sore throat, or other symptoms of a cold or flu. Do not treat yourself. Try to avoid being around people who are sick. Avoid taking medications that contain aspirin, acetaminophen, ibuprofen, naproxen, or ketoprofen unless instructed by your care team. These medications may hide a fever. Talk to your care team if you may be pregnant. Serious birth defects can occur if you take this medication during pregnancy and for 7 months after the last dose. You will need a negative pregnancy test before starting this medication. Contraception is recommended while taking this medication and for 7 months after the last dose. Your care team can help you find the option that works for you. Do not breastfeed while taking this medication and for 7 months after the last dose. What side effects may I notice from receiving this medication? Side effects that you should report to your care team as soon as possible: Allergic reactions or angioedema--skin rash, itching or hives, swelling of the  face, eyes, lips, tongue, arms, or legs, trouble swallowing or breathing Dry cough, shortness of breath or trouble breathing Heart failure--shortness of breath, swelling of the ankles, feet, or hands, sudden weight gain, unusual weakness  or fatigue Infection--fever, chills, cough, or sore throat Infusion reactions--chest pain, shortness of breath or trouble breathing, feeling faint or lightheaded Side effects that usually do not require medical attention (report these to your care team if they continue or are bothersome): Diarrhea Hair loss Nausea Pain, redness, or irritation at injection site Pain, redness, or swelling with sores inside the mouth or throat Unusual weakness or fatigue This list may not describe all possible side effects. Call your doctor for medical advice about side effects. You may report side effects to FDA at 1-800-FDA-1088. Where should I keep my medication? This medication is given in a hospital or clinic. It will not be stored at home. NOTE: This sheet is a summary. It may not cover all possible information. If you have questions about this medicine, talk to your doctor, pharmacist, or health care provider.  2024 Elsevier/Gold Standard (2021-05-28 00:00:00)

## 2023-03-14 NOTE — Telephone Encounter (Signed)
03/14/23 Spoke with patient and confirmed next appt.

## 2023-03-15 ENCOUNTER — Other Ambulatory Visit: Payer: Self-pay

## 2023-03-15 LAB — CANCER ANTIGEN 27.29: CA 27.29: 89 U/mL — ABNORMAL HIGH (ref 0.0–38.6)

## 2023-03-21 ENCOUNTER — Encounter: Payer: Self-pay | Admitting: Oncology

## 2023-03-22 ENCOUNTER — Telehealth: Payer: Self-pay

## 2023-03-22 NOTE — Telephone Encounter (Signed)
-----   Message from [name] sent at 03/21/2023  7:07 AM EST ----- Regarding: call Let her/daughter know the cancer test has come down by over 40 points

## 2023-03-22 NOTE — Telephone Encounter (Signed)
 Daughter Valentino Saxon aware of message.

## 2023-03-30 ENCOUNTER — Other Ambulatory Visit: Payer: Self-pay | Admitting: Oncology

## 2023-03-30 DIAGNOSIS — Z170 Estrogen receptor positive status [ER+]: Secondary | ICD-10-CM

## 2023-04-03 ENCOUNTER — Telehealth: Payer: Self-pay

## 2023-04-03 ENCOUNTER — Encounter: Payer: Self-pay | Admitting: Oncology

## 2023-04-03 ENCOUNTER — Other Ambulatory Visit: Payer: Self-pay | Admitting: Oncology

## 2023-04-03 DIAGNOSIS — M25552 Pain in left hip: Secondary | ICD-10-CM

## 2023-04-03 NOTE — Telephone Encounter (Signed)
 She had a fall about 1.5 weeks ago, but didn't get hurt, never c/o pain. She got back up and was fine. Pt started c/o left hip pain yesterday, and today it is worse per pt.  Pain is worse with movement. She is taking pain medication as prescribed. Pt's daughter req recommendation.  ?need xray before sees Dr Gilman Buttner on Wednesday.

## 2023-04-04 ENCOUNTER — Encounter: Payer: Self-pay | Admitting: Oncology

## 2023-04-05 ENCOUNTER — Other Ambulatory Visit: Payer: Self-pay | Admitting: Oncology

## 2023-04-05 ENCOUNTER — Encounter: Payer: Self-pay | Admitting: Oncology

## 2023-04-05 ENCOUNTER — Inpatient Hospital Stay: Payer: Medicare Other

## 2023-04-05 ENCOUNTER — Inpatient Hospital Stay (HOSPITAL_BASED_OUTPATIENT_CLINIC_OR_DEPARTMENT_OTHER): Payer: Medicare Other

## 2023-04-05 ENCOUNTER — Inpatient Hospital Stay: Payer: Medicare Other | Attending: Hematology and Oncology | Admitting: Oncology

## 2023-04-05 ENCOUNTER — Ambulatory Visit (INDEPENDENT_AMBULATORY_CARE_PROVIDER_SITE_OTHER)
Admission: RE | Admit: 2023-04-05 | Discharge: 2023-04-05 | Disposition: A | Discharge status: Home / Self Care | Source: Ambulatory Visit | Attending: Oncology | Admitting: Oncology

## 2023-04-05 VITALS — BP 145/71 | HR 77 | Temp 98.2°F | Resp 16 | Ht 64.5 in

## 2023-04-05 DIAGNOSIS — Z5111 Encounter for antineoplastic chemotherapy: Secondary | ICD-10-CM | POA: Diagnosis present

## 2023-04-05 DIAGNOSIS — Z79899 Other long term (current) drug therapy: Secondary | ICD-10-CM | POA: Insufficient documentation

## 2023-04-05 DIAGNOSIS — C50411 Malignant neoplasm of upper-outer quadrant of right female breast: Secondary | ICD-10-CM | POA: Insufficient documentation

## 2023-04-05 DIAGNOSIS — C7951 Secondary malignant neoplasm of bone: Secondary | ICD-10-CM | POA: Insufficient documentation

## 2023-04-05 DIAGNOSIS — D649 Anemia, unspecified: Secondary | ICD-10-CM | POA: Diagnosis not present

## 2023-04-05 DIAGNOSIS — M25552 Pain in left hip: Secondary | ICD-10-CM | POA: Diagnosis not present

## 2023-04-05 DIAGNOSIS — Z923 Personal history of irradiation: Secondary | ICD-10-CM | POA: Insufficient documentation

## 2023-04-05 DIAGNOSIS — Z170 Estrogen receptor positive status [ER+]: Secondary | ICD-10-CM

## 2023-04-05 LAB — CMP (CANCER CENTER ONLY)
ALT: <5 U/L (ref 0–44)
AST: 16 U/L (ref 15–41)
Albumin: 3.7 g/dL (ref 3.5–5.0)
Alkaline Phosphatase: 201 U/L — ABNORMAL HIGH (ref 38–126)
Anion gap: 11 (ref 5–15)
BUN: 16 mg/dL (ref 8–23)
CO2: 26 mmol/L (ref 22–32)
Calcium: 10.3 mg/dL (ref 8.9–10.3)
Chloride: 101 mmol/L (ref 98–111)
Creatinine: 0.7 mg/dL (ref 0.44–1.00)
GFR, Estimated: >60 mL/min (ref >60)
Glucose, Bld: 117 mg/dL — ABNORMAL HIGH (ref 70–99)
Potassium: 3.8 mmol/L (ref 3.5–5.1)
Sodium: 137 mmol/L (ref 135–145)
Total Bilirubin: 0.9 mg/dL (ref 0.0–1.2)
Total Protein: 6.5 g/dL (ref 6.5–8.1)

## 2023-04-05 LAB — CBC WITH DIFFERENTIAL (CANCER CENTER ONLY)
Abs Immature Granulocytes: 0.04 10*3/uL (ref 0.00–0.07)
Basophils Absolute: 0.1 10*3/uL (ref 0.0–0.1)
Basophils Relative: 1 %
Eosinophils Absolute: 0.1 10*3/uL (ref 0.0–0.5)
Eosinophils Relative: 2 %
HCT: 33.8 % — ABNORMAL LOW (ref 36.0–46.0)
Hemoglobin: 10.8 g/dL — ABNORMAL LOW (ref 12.0–15.0)
Immature Granulocytes: 1 %
Lymphocytes Relative: 11 %
Lymphs Abs: 0.5 10*3/uL — ABNORMAL LOW (ref 0.7–4.0)
MCH: 30 pg (ref 26.0–34.0)
MCHC: 32 g/dL (ref 30.0–36.0)
MCV: 93.9 fL (ref 80.0–100.0)
Monocytes Absolute: 0.4 10*3/uL (ref 0.1–1.0)
Monocytes Relative: 8 %
Neutro Abs: 3.9 10*3/uL (ref 1.7–7.7)
Neutrophils Relative %: 77 %
Platelet Count: 205 10*3/uL (ref 150–400)
RBC: 3.6 MIL/uL — ABNORMAL LOW (ref 3.87–5.11)
RDW: 14.5 % (ref 11.5–15.5)
WBC Count: 5 10*3/uL (ref 4.0–10.5)
nRBC: 0 % (ref 0.0–0.2)
nRBC: 0 /100{WBCs}

## 2023-04-05 MED ORDER — DIPHENHYDRAMINE HCL 25 MG PO CAPS
50.0000 mg | ORAL_CAPSULE | Freq: Once | ORAL | Status: AC
  Administered 2023-04-05: 50 mg via ORAL
  Filled 2023-04-05: qty 2

## 2023-04-05 MED ORDER — ACETAMINOPHEN 325 MG PO TABS
650.0000 mg | ORAL_TABLET | Freq: Once | ORAL | Status: AC
  Administered 2023-04-05: 650 mg via ORAL
  Filled 2023-04-05: qty 2

## 2023-04-05 MED ORDER — TRASTUZUMAB-HYALURONIDASE-OYSK 600-10000 MG-UNT/5ML ~~LOC~~ SOLN
600.0000 mg | Freq: Once | SUBCUTANEOUS | Status: AC
  Administered 2023-04-05: 600 mg via SUBCUTANEOUS
  Filled 2023-04-05: qty 5

## 2023-04-05 NOTE — Patient Instructions (Signed)
 Trastuzumab; Hyaluronidase Injection What is this medication? TRASTUZUMAB; HYALURONIDASE (tras TOO zoo mab; hye al ur ON i dase) treats breast cancer. Trastuzumab works by blocking a protein that causes cancer cells to grow and multiply. This helps to slow or stop the spread of cancer cells. Hyaluronidase works by increasing the absorption of other medications in the body to help them work better. It is a combination medication that contains a monoclonal antibody. This medicine may be used for other purposes; ask your health care provider or pharmacist if you have questions. COMMON BRAND NAME(S): HERCEPTIN HYLECTA What should I tell my care team before I take this medication? They need to know if you have any of these conditions: Heart failure Lung disease An unusual or allergic reaction to trastuzumab, or other medications, foods, dyes, or preservatives Pregnant or trying to get pregnant Breast-feeding How should I use this medication? This medication is injected under the skin. It is given by your care team in a hospital or clinic setting. Talk to your care team about the use of this medication in children. It is not approved for use in children. Overdosage: If you think you have taken too much of this medicine contact a poison control center or emergency room at once. NOTE: This medicine is only for you. Do not share this medicine with others. What if I miss a dose? Keep appointments for follow-up doses. It is important not to miss your dose. Call your care team if you are unable to keep an appointment. What may interact with this medication? Certain types of chemotherapy, such as daunorubicin, doxorubicin, epirubicin, idarubicin This list may not describe all possible interactions. Give your health care provider a list of all the medicines, herbs, non-prescription drugs, or dietary supplements you use. Also tell them if you smoke, drink alcohol, or use illegal drugs. Some items may interact  with your medicine. What should I watch for while using this medication? Your condition will be monitored carefully while you are receiving this medication. This medication may make you feel generally unwell. This is not uncommon as chemotherapy can affect healthy cells as well as cancer cells. Report any side effects. Continue your course of treatment even though you feel ill unless your care team tells you to stop. This medication may increase your risk of getting an infection. Call your care team for advice if you get a fever, chills, sore throat, or other symptoms of a cold or flu. Do not treat yourself. Try to avoid being around people who are sick. Avoid taking medications that contain aspirin, acetaminophen, ibuprofen, naproxen, or ketoprofen unless instructed by your care team. These medications may hide a fever. Talk to your care team if you may be pregnant. Serious birth defects can occur if you take this medication during pregnancy and for 7 months after the last dose. You will need a negative pregnancy test before starting this medication. Contraception is recommended while taking this medication and for 7 months after the last dose. Your care team can help you find the option that works for you. Do not breastfeed while taking this medication or for 7 months after the last dose. What side effects may I notice from receiving this medication? Side effects that you should report to your care team as soon as possible: Allergic reactions or angioedema--skin rash, itching or hives, swelling of the face, eyes, lips, tongue, arms, or legs, trouble swallowing or breathing Dry cough, shortness of breath or trouble breathing Heart failure--shortness of breath, swelling  of the ankles, feet, or hands, sudden weight gain, unusual weakness or fatigue Heart rhythm changes--fast or irregular heartbeat, dizziness, feeling faint or lightheaded, chest pain, trouble breathing Increase in blood  pressure Infection--fever, chills, cough, or sore throat Side effects that usually do not require medical attention (report these to your care team if they continue or are bothersome): Diarrhea Hair loss Headache Muscle pain Nausea Unusual weakness or fatigue This list may not describe all possible side effects. Call your doctor for medical advice about side effects. You may report side effects to FDA at 1-800-FDA-1088. Where should I keep my medication? This medication is given in a hospital or clinic. It will not be stored at home. NOTE: This sheet is a summary. It may not cover all possible information. If you have questions about this medicine, talk to your doctor, pharmacist, or health care provider.  2024 Elsevier/Gold Standard (2021-05-25 00:00:00)

## 2023-04-05 NOTE — Progress Notes (Signed)
 [facility]  [address] 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  She is status post neoadjuvant trastuzumab/pertuzumab/paclitaxel. She had an excellent response to this, with just a tiny residual invasive carcinoma. She received 4 cycles of adjuvant Kadcyla but this was discontinued due to toxicities. She went on to have single agent trastuzumab to complete 1 year of HER2 targeted therapy. Right diagnostic mammogram in June did not reveal any evidence of malignancy.  She had benign postlumpectomy changes and benign, stable, biopsied right breast asymmetry.    Malignant neoplasm metastatic to bone Her MRI scans were suspicious for recurrent breast cancer. This was confirmed bone metastases as of November, 2024. PET Scan revealed multiple areas of metastatic bone disease. MRI thoracic imaging on 11/28/2022 revealed excessive osseous metastasis seen throughout the thoracic and lumbar spine. There is likely evidence of epidural extension of tumor at the T7 level with likely at least mild spinal canal narrowing. She completed palliative radiation to right right shoulder and T-spine on 12/28/2022 with Dr. Thersa Salt, which gave her relief of that pain. She was still having constant pain which was disturbing her sleep and limiting her activities. We started the Herceptin and her pain is steadily improving.    Right arm weakness CT of thoracic spine on 11/01/2022 showed there is likely evidence of epidural extension of tumor at the T7 level with likely at least mild spinal canal narrowing. She has received radiation to that area with some improvement. She is also on low-doses of analgesics and Herceptin.  Word  finding difficulty Difficulty with word finding, in addition to the right arm weakness. MRI of the brain done on 11/01/2022 shows no acute intracranial abnormality and moderate to severe chronic small vessel ischemic disease with multiple chronic infarctions.    Anemia Mild anemia, stable. On 10/27/2022 her vitamin B-12, folate, ferritin, iron, and TIBC were all normal.    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  Plan: I ordered a hip x-ray which was done today for a recent hip injury and results are pending. Her daughter informed me that her mother is not taking any of her medications other than tramadol TID. I informed her that she can take tramadol as much as four a day. She notes her last bowel movement was last Saturday. I instructed her to take Senokot BID and milk of magnesia. Her day 1 cycle 4 of Herceptin-Hylecta is scheduled on 04/05/2023. She has a WBC of 5.0, hemoglobin of 10.8, and platelet count of 205,000. Her CMP is normal besides a elevated phosphatase of 201 up from 182. I will see her back in 3 weeks with CBC and CMP.   I provided 18 minutes of face-to-face time during this this encounter and > 50% was spent counseling as documented under my assessment and plan.   [name], MD  [facility] - A DEPT OF [name] [address] [name] [address] Dept: [phone] Dept Fax: [phone]   No orders of the defined types were placed in this encounter.   CHIEF COMPLAINT:  CC: Stage IB HER2 receptor positive breast cancer  Current Treatment: Observation  HISTORY OF  PRESENT ILLNESS:   Oncology History  Malignant neoplasm of upper-outer quadrant of right breast in female, estrogen receptor positive (HCC)  12/18/2019 Cancer Staging   Staging form: Breast, AJCC 8th Edition - Clinical stage from 12/18/2019: Stage IB (cT2, cN0, cM0, G3, ER+,  PR+, HER2+) - Signed by [name], MD on 01/19/2020   01/08/2020 Initial Diagnosis   Breast cancer, right (HCC)   01/28/2020 - 03/26/2020 Chemotherapy    Patient is on Treatment Plan: BREAST TRASTUZUMAB + PERTUZUMAB Q21D X 11 CYCLES      06/01/2020 - 06/01/2020 Chemotherapy         06/17/2020 - 07/29/2020 Chemotherapy         09/17/2020 - 04/16/2021 Chemotherapy   Patient is on Treatment Plan : BREAST Trastuzumab q21d X 11 Cycles     01/31/2023 -  Chemotherapy   Patient is on Treatment Plan : BREAST MAINTENANCE Trastuzumab SQ (600) D1 q21d x 13 cycles     Malignant neoplasm metastatic to bone (HCC)  11/10/2022 Initial Diagnosis   Malignant neoplasm metastatic to bone (HCC)   01/31/2023 -  Chemotherapy   Patient is on Treatment Plan : BREAST MAINTENANCE Trastuzumab SQ (600) D1 q21d x 13 cycles       INTERVAL HISTORY:  Brandy Jacobs is an 89 year old woman with Stage IB HER2 positive, hormone positive breast cancer diagnosed in November of 2021 and treated with neoadjuvant chemotherapy with THP. Postop she received 4 cycles of Kadcyla followed by trastuzumab and pertuzumab for a total of 1 year post-op. She was found to have widespread bone metastases in October, 2024 with epidural extension at T7. The patient completed palliative radiation to T-spine and right shoulder with Dr. Thersa Salt on 12/28/2022. Patient states that she feels ok but complains of left hip pain with spasm rating a 6/10. She recently had a fall and cannot remember exactly how she fell or where she landed on but has noted hip pain that has worsened over the weekend. I ordered a hip x-ray which was done today but results are pending. Her daughter informed me that her mother is not taking any of her medications besides tramadol TID. I informed her that she can take tramadol as much as four a day. She notes her last bowel movement was last Saturday. I instructed her to take Senokot BID and milk of magnesia. Her day 1 cycle 4 of  Herceptin-Hylecta is scheduled on 04/05/2023. She has a WBC of 5.0, hemoglobin of 10.8, and platelet count of 205,000. Her CMP is normal besides a elevated phosphatase of 201 up from 182. I will see her back in 3 weeks with CBC and CMP.  She denies signs of infection such as sore throat, sinus drainage, cough, or urinary symptoms.  She denies fevers or recurrent chills. She denies nausea, vomiting, chest pain, dyspnea or cough. Her appetite is poor. This patient is accompanied in the office by her daughter.   REVIEW OF SYSTEMS:  Review of Systems  Constitutional:  Positive for fatigue. Negative for appetite change, chills, diaphoresis, fever and unexpected weight change.  HENT:   Positive for hearing loss. Negative for lump/mass, mouth sores, nosebleeds, sore throat, tinnitus, trouble swallowing and voice change.   Eyes: Negative.  Negative for eye problems and icterus.  Respiratory: Negative.  Negative for chest tightness, cough, hemoptysis, shortness of breath and wheezing.   Cardiovascular: Negative.  Negative for chest pain, leg swelling and palpitations.  Gastrointestinal:  Positive for constipation. Negative for abdominal distention,  abdominal pain, blood in stool, diarrhea, nausea, rectal pain and vomiting.  Endocrine: Negative.   Genitourinary: Negative.  Negative for bladder incontinence, difficulty urinating, dyspareunia, dysuria, frequency, hematuria, menstrual problem, nocturia, pelvic pain, vaginal bleeding and vaginal discharge.   Musculoskeletal:  Positive for arthralgias, back pain (imporving) and gait problem (wheelchair). Negative for flank pain, myalgias, neck pain and neck stiffness.       Left hip pain with spasm, 6/10  Skin: Negative.  Negative for itching, rash and wound.  Neurological:  Positive for extremity weakness and gait problem (wheelchair). Negative for dizziness, headaches, light-headedness, numbness, seizures and speech difficulty.  Hematological: Negative.  Negative  for adenopathy. Does not bruise/bleed easily.  Psychiatric/Behavioral:  Positive for confusion and sleep disturbance. Negative for decreased concentration, depression and suicidal ideas. The patient is not nervous/anxious.    VITALS:  Blood pressure (!) 145/71, pulse 77, temperature 98.2 F (36.8 C), temperature source Oral, resp. rate 16, height 5' 4.5" (1.638 m), SpO2 97%.  Wt Readings from Last 3 Encounters:  03/14/23 128 lb 11.2 oz (58.4 kg)  02/21/23 133 lb 3.2 oz (60.4 kg)  01/31/23 138 lb (62.6 kg)    Body mass index is 21.75 kg/m.  Performance status (ECOG): 2 - Symptomatic, <50% confined to bed  PHYSICAL EXAM:  Physical Exam Vitals and nursing note reviewed. Exam conducted with a chaperone present.  Constitutional:      General: She is not in acute distress.    Appearance: Normal appearance. She is well-developed and normal weight. She is not ill-appearing, toxic-appearing or diaphoretic.  HENT:     Head: Normocephalic and atraumatic.     Right Ear: Tympanic membrane, ear canal and external ear normal. There is no impacted cerumen.     Left Ear: Tympanic membrane, ear canal and external ear normal. There is no impacted cerumen.     Nose: Nose normal. No congestion or rhinorrhea.     Mouth/Throat:     Mouth: Mucous membranes are moist.     Pharynx: Oropharynx is clear. No oropharyngeal exudate or posterior oropharyngeal erythema.  Eyes:     General: No scleral icterus.       Right eye: No discharge.        Left eye: No discharge.     Extraocular Movements: Extraocular movements intact.     Conjunctiva/sclera: Conjunctivae normal.     Pupils: Pupils are equal, round, and reactive to light.  Neck:     Vascular: No carotid bruit.  Cardiovascular:     Rate and Rhythm: Normal rate and regular rhythm.     Pulses: Normal pulses.     Heart sounds: Normal heart sounds. No murmur heard.    No friction rub. No gallop.  Pulmonary:     Effort: Pulmonary effort is normal. No  respiratory distress.     Breath sounds: Normal breath sounds. No stridor. No wheezing, rhonchi or rales.  Chest:     Chest wall: No tenderness.  Abdominal:     General: Bowel sounds are normal. There is no distension.     Palpations: Abdomen is soft. There is no hepatomegaly, splenomegaly or mass.     Tenderness: There is no abdominal tenderness. There is no right CVA tenderness, left CVA tenderness, guarding or rebound.     Hernia: No hernia is present.  Musculoskeletal:        General: No swelling, tenderness, deformity or signs of injury. Normal range of motion.     Cervical back: Normal range of  motion and neck supple. No rigidity or tenderness.     Right lower leg: No edema.     Left lower leg: No edema.  Lymphadenopathy:     Cervical: No cervical adenopathy.     Right cervical: No superficial, deep or posterior cervical adenopathy.    Left cervical: No superficial, deep or posterior cervical adenopathy.     Upper Body:     Right upper body: No supraclavicular, axillary or pectoral adenopathy.     Left upper body: No supraclavicular, axillary or pectoral adenopathy.  Skin:    General: Skin is warm and dry.     Coloration: Skin is not jaundiced or pale.     Findings: No bruising, erythema, lesion or rash.  Neurological:     General: No focal deficit present.     Mental Status: She is alert and oriented to person, place, and time. Mental status is at baseline.     Cranial Nerves: No cranial nerve deficit.     Sensory: No sensory deficit.     Motor: No weakness.     Coordination: Coordination normal.     Gait: Gait normal.     Deep Tendon Reflexes: Reflexes normal.  Psychiatric:        Mood and Affect: Mood normal.        Behavior: Behavior normal.        Thought Content: Thought content normal.        Judgment: Judgment normal.    LABS:      Latest Ref Rng & Units 04/05/2023   12:51 PM 03/14/2023   12:55 PM 02/21/2023    1:03 PM  CBC  WBC 4.0 - 10.5 K/uL 5.0  4.9   4.8   Hemoglobin 12.0 - 15.0 g/dL 57.8  46.9  62.9   Hematocrit 36.0 - 46.0 % 33.8  32.5  33.1   Platelets 150 - 400 K/uL 205  180  177       Latest Ref Rng & Units 04/05/2023   12:51 PM 03/14/2023   12:55 PM 02/21/2023    1:03 PM  CMP  Glucose 70 - 99 mg/dL 528  413  244   BUN 8 - 23 mg/dL 16  16  11    Creatinine 0.44 - 1.00 mg/dL 0.10  2.72  5.36   Sodium 135 - 145 mmol/L 137  137  138   Potassium 3.5 - 5.1 mmol/L 3.8  4.1  4.2   Chloride 98 - 111 mmol/L 101  102  100   CO2 22 - 32 mmol/L 26  26  27    Calcium 8.9 - 10.3 mg/dL 64.4  03.4  74.2   Total Protein 6.5 - 8.1 g/dL 6.5  6.5  6.4   Total Bilirubin 0.0 - 1.2 mg/dL 0.9  0.9  0.9   Alkaline Phos 38 - 126 U/L 201  182  197   AST 15 - 41 U/L 16  21  25    ALT 0 - 44 U/L 5  6  9     Component Ref Range & Units (hover) 3 wk ago (03/14/23) 2 mo ago (01/31/23) 2 mo ago (01/13/23) 4 mo ago (12/01/22)  CA 27.29 89.0 High  130.8 High  CM 117.2 High  CM 111.7 High  CM   Lab Results  Component Value Date   CEA 10.17 (H) 01/31/2023   /  CEA ([facility])  Date Value Ref Range Status  01/31/2023 10.17 (H) 0.00 - 5.00 ng/mL Final  Comment:    (NOTE) This test was performed using Beckman Coulter's paramagnetic chemiluminescent immunoassay. Values obtained from different assay methods cannot be used interchangeably. Please note that up to 8% of patients who smoke may see values 5.1-10.0 ng/ml and 1% of patients who smoke may see CEA levels >10.0 ng/ml. Performed at [facility], [address], [place], [name] [address]    No results found for: "PSA1" No results found for: "UXL244" No results found for: "WNU272"  Lab Results  Component Value Date   TOTALPROTELP 6.5 12/01/2022   ALBUMINELP 3.9 12/01/2022   A1GS 0.3 12/01/2022   A2GS 0.7 12/01/2022   BETS 0.9 12/01/2022   GAMS 0.8 12/01/2022   MSPIKE Not Observed 12/01/2022   SPEI Comment 12/01/2022   Lab Results  Component Value Date   TIBC 298  10/27/2022   FERRITIN 96 10/27/2022   IRONPCTSAT 24 10/27/2022   Lab Results  Component Value Date   VITAMINB12 501 01/13/2023   No results found for: "LDH"  STUDIES:       HISTORY:   Past Medical History:  Diagnosis Date   Anemia 10/26/2022   Arthritis    osteoarthritis   Breast cancer (HCC)    Diabetes mellitus without complication (HCC)    not on medication   History of CVA (cerebrovascular accident)    Hypertension    Multiple thyroid nodules    benign   Right arm weakness 10/26/2022   Upper back pain 10/26/2022   Word finding difficulty 10/26/2022   Family History  Problem Relation Age of Onset   Colon cancer Father        58s   Breast cancer Sister        98-60   Colon cancer Brother        31s   Prostate cancer Brother        late 16s   Breast cancer Half-Sister        late 21s early 108s    Social History:  reports that she has never smoked. She has never used smokeless tobacco. She reports that she does not currently use alcohol. She reports that she does not use drugs.The patient is accompanied by her daughter today.  Allergies: No Known Allergies  Current Medications: Current Outpatient Medications  Medication Sig Dispense Refill   acetaminophen (TYLENOL) 500 MG tablet Take 500 mg by mouth at bedtime.     docusate sodium (COLACE) 100 MG capsule Take 100 mg by mouth 2 (two) times daily.     fentaNYL (DURAGESIC) 12 MCG/HR Place 1 patch onto the skin every 3 (three) days. 10 patch 0   ondansetron (ZOFRAN-ODT) 4 MG disintegrating tablet Take 1 tablet (4 mg total) by mouth every 6 (six) hours as needed for nausea or vomiting. 60 tablet 3   traMADol (ULTRAM) 50 MG tablet Take 1 tablet (50 mg total) by mouth every 6 (six) hours as needed. 100 tablet 0   No current facility-administered medications for this visit.    I,Jasmine M Lassiter,acting as a scribe for [name], MD.,have documented all relevant documentation on the behalf of  [name], MD,as directed by  [name], MD while in the presence of [name], MD.

## 2023-04-06 ENCOUNTER — Other Ambulatory Visit: Payer: Self-pay

## 2023-04-10 ENCOUNTER — Other Ambulatory Visit: Payer: Self-pay

## 2023-04-12 ENCOUNTER — Other Ambulatory Visit: Payer: Self-pay | Admitting: Pharmacist

## 2023-04-12 DIAGNOSIS — C50411 Malignant neoplasm of upper-outer quadrant of right female breast: Secondary | ICD-10-CM

## 2023-04-13 ENCOUNTER — Telehealth: Payer: Self-pay | Admitting: Oncology

## 2023-04-13 ENCOUNTER — Other Ambulatory Visit: Payer: Self-pay | Admitting: Oncology

## 2023-04-13 DIAGNOSIS — C7951 Secondary malignant neoplasm of bone: Secondary | ICD-10-CM

## 2023-04-13 NOTE — Telephone Encounter (Signed)
 04/20/23 Spoke with patients daughter and confirmed Echo appt scheduled on 04/20/23@1pm  at [facility].

## 2023-04-14 ENCOUNTER — Encounter: Payer: Self-pay | Admitting: Oncology

## 2023-04-20 DIAGNOSIS — I340 Nonrheumatic mitral (valve) insufficiency: Secondary | ICD-10-CM

## 2023-04-20 DIAGNOSIS — I361 Nonrheumatic tricuspid (valve) insufficiency: Secondary | ICD-10-CM

## 2023-04-26 ENCOUNTER — Inpatient Hospital Stay (HOSPITAL_BASED_OUTPATIENT_CLINIC_OR_DEPARTMENT_OTHER): Admitting: Hematology and Oncology

## 2023-04-26 ENCOUNTER — Inpatient Hospital Stay

## 2023-04-26 ENCOUNTER — Encounter: Payer: Self-pay | Admitting: Hematology and Oncology

## 2023-04-26 ENCOUNTER — Inpatient Hospital Stay: Attending: Hematology and Oncology

## 2023-04-26 ENCOUNTER — Ambulatory Visit (HOSPITAL_BASED_OUTPATIENT_CLINIC_OR_DEPARTMENT_OTHER)
Admission: RE | Admit: 2023-04-26 | Discharge: 2023-04-26 | Disposition: A | Discharge status: Home / Self Care | Source: Ambulatory Visit | Attending: Hematology and Oncology | Admitting: Hematology and Oncology

## 2023-04-26 VITALS — BP 144/71 | HR 74 | Temp 98.2°F | Resp 18

## 2023-04-26 DIAGNOSIS — G893 Neoplasm related pain (acute) (chronic): Secondary | ICD-10-CM | POA: Diagnosis not present

## 2023-04-26 DIAGNOSIS — Z8673 Personal history of transient ischemic attack (TIA), and cerebral infarction without residual deficits: Secondary | ICD-10-CM | POA: Insufficient documentation

## 2023-04-26 DIAGNOSIS — Z79899 Other long term (current) drug therapy: Secondary | ICD-10-CM | POA: Insufficient documentation

## 2023-04-26 DIAGNOSIS — R410 Disorientation, unspecified: Secondary | ICD-10-CM | POA: Insufficient documentation

## 2023-04-26 DIAGNOSIS — C7951 Secondary malignant neoplasm of bone: Secondary | ICD-10-CM | POA: Diagnosis not present

## 2023-04-26 DIAGNOSIS — Z923 Personal history of irradiation: Secondary | ICD-10-CM | POA: Diagnosis not present

## 2023-04-26 DIAGNOSIS — C50411 Malignant neoplasm of upper-outer quadrant of right female breast: Secondary | ICD-10-CM | POA: Insufficient documentation

## 2023-04-26 DIAGNOSIS — Z170 Estrogen receptor positive status [ER+]: Secondary | ICD-10-CM

## 2023-04-26 DIAGNOSIS — R0689 Other abnormalities of breathing: Secondary | ICD-10-CM

## 2023-04-26 DIAGNOSIS — D649 Anemia, unspecified: Secondary | ICD-10-CM

## 2023-04-26 LAB — CBC WITH DIFFERENTIAL (CANCER CENTER ONLY)
Abs Immature Granulocytes: 0.03 10*3/uL (ref 0.00–0.07)
Basophils Absolute: 0.1 10*3/uL (ref 0.0–0.1)
Basophils Relative: 1 %
Eosinophils Absolute: 0.2 10*3/uL (ref 0.0–0.5)
Eosinophils Relative: 4 %
HCT: 34.7 % — ABNORMAL LOW (ref 36.0–46.0)
Hemoglobin: 10.6 g/dL — ABNORMAL LOW (ref 12.0–15.0)
Immature Granulocytes: 1 %
Lymphocytes Relative: 15 %
Lymphs Abs: 0.6 10*3/uL — ABNORMAL LOW (ref 0.7–4.0)
MCH: 29.1 pg (ref 26.0–34.0)
MCHC: 30.5 g/dL (ref 30.0–36.0)
MCV: 95.3 fL (ref 80.0–100.0)
Monocytes Absolute: 0.3 10*3/uL (ref 0.1–1.0)
Monocytes Relative: 8 %
Neutro Abs: 2.7 10*3/uL (ref 1.7–7.7)
Neutrophils Relative %: 71 %
Platelet Count: 167 10*3/uL (ref 150–400)
RBC: 3.64 MIL/uL — ABNORMAL LOW (ref 3.87–5.11)
RDW: 14.4 % (ref 11.5–15.5)
WBC Count: 3.8 10*3/uL — ABNORMAL LOW (ref 4.0–10.5)
nRBC: 0 % (ref 0.0–0.2)
nRBC: 0 /100{WBCs}

## 2023-04-26 LAB — CMP (CANCER CENTER ONLY)
ALT: 6 U/L (ref 0–44)
AST: 20 U/L (ref 15–41)
Albumin: 3.8 g/dL (ref 3.5–5.0)
Alkaline Phosphatase: 201 U/L — ABNORMAL HIGH (ref 38–126)
Anion gap: 11 (ref 5–15)
BUN: 12 mg/dL (ref 8–23)
CO2: 28 mmol/L (ref 22–32)
Calcium: 10.2 mg/dL (ref 8.9–10.3)
Chloride: 100 mmol/L (ref 98–111)
Creatinine: 0.72 mg/dL (ref 0.44–1.00)
GFR, Estimated: >60 mL/min (ref >60)
Glucose, Bld: 139 mg/dL — ABNORMAL HIGH (ref 70–99)
Potassium: 3.5 mmol/L (ref 3.5–5.1)
Sodium: 138 mmol/L (ref 135–145)
Total Bilirubin: 1.1 mg/dL (ref 0.0–1.2)
Total Protein: 6.1 g/dL — ABNORMAL LOW (ref 6.5–8.1)

## 2023-04-26 NOTE — Assessment & Plan Note (Signed)
 Chronic mild anemia, which is stable.  Evaluation did not reveal any nutritional deficiency or monoclonal gammopathy.

## 2023-04-26 NOTE — Assessment & Plan Note (Addendum)
 In October 2024, she reported right arm weakness, so underwent MRI head, cervical spine and thoracic spine.  MRI cervical spine revealed subcentimeter hyperintense lesion at C6 suspicious for metastatic disease with multilevel degenerative changes throughout the cervical spine.  MRI thoracic spine revealed multiple hyperintense lesions throughout this thoracic spine with epidural tumor extension at T7 and mild spinal canal narrowing MRI head did not reveal any acute intracranial abnormality.  Moderate to severe chronic small vessel ischemic disease with multiple chronic infarcts was seen.  PET scan revealed a small hypermetabolic focus in the right lateral breast consistent with a small focus of breast cancer.  Diffuse hypermetabolic skeletal metastasis seen.  There is focal hypermetabolic activity near the left tonsil/tongue base and scattered hypermetabolic foci within the colon.  MRI lumbar spine in November revealed multiple focal signal abnormality scattered throughout the spine consistent with osseous metastasis.  She was given fentanyl 12 mcg/h and tramadol for her pain.  She received radiation to T6-7 and the right humerus with improvement in her pain.  She was started on trastuzumab every 3 weeks in January with continued improvement in her pain.  She has had progressive weakness over the past 1 to 2 months and has sustained falls.  She is only able ambulate short distances with her cane.  She is now living with her daughter.  She has had previous difficulty with word finding, but is very slow to answer or does not answer today.  Her affect is flat.  She has difficulty following directions.  She has a history of stroke.  I will hold her treatment today and obtain a stat CT head without contrast for further evaluation.  We may wish to repeat an MRI brain as well.  This may be due to known chronic ischemic disease.  Her general weakness may be secondary to trastuzumab.

## 2023-04-26 NOTE — Assessment & Plan Note (Signed)
 She has had difficulty with word finding, but she is not oriented today.  She has a flat affect, which is not her usual.  She is very slow to answer does not answer at all.  She looks to her daughter for answers.  She later states she cannot remember.  She has lower extremity weakness.  She has a history of stroke, as well as chronic small vessel ischemic disease and multiple chronic infarcts.  I still recommend obtaining a CT head without contrast due to the changes.  We may also wish to obtain a repeat MRI brain.

## 2023-04-26 NOTE — Progress Notes (Signed)
 [facility]  [address] [place],  [name]  [address] [phone]  Clinic Day:  04/26/2023  Referring physician: [name], MD  ASSESSMENT & PLAN:   Assessment & Plan: Malignant neoplasm metastatic to bone [facility]) In October 2024, she reported right arm weakness, so underwent MRI head, cervical spine and thoracic spine.  MRI cervical spine revealed subcentimeter hyperintense lesion at C6 suspicious for metastatic disease with multilevel degenerative changes throughout the cervical spine.  MRI thoracic spine revealed multiple hyperintense lesions throughout this thoracic spine with epidural tumor extension at T7 and mild spinal canal narrowing MRI head did not reveal any acute intracranial abnormality.  Moderate to severe chronic small vessel ischemic disease with multiple chronic infarcts was seen.  PET scan revealed a small hypermetabolic focus in the right lateral breast consistent with a small focus of breast cancer.  Diffuse hypermetabolic skeletal metastasis seen.  There is focal hypermetabolic activity near the left tonsil/tongue base and scattered hypermetabolic foci within the colon.  MRI lumbar spine in November revealed multiple focal signal abnormality scattered throughout the spine consistent with osseous metastasis.  She was given fentanyl 12 mcg/h and tramadol for her pain.  She received radiation to T6-7 and the right humerus with improvement in her pain.  She was started on trastuzumab every 3 weeks in January with continued improvement in her pain.  She has had progressive weakness over the past 1 to 2 months and has sustained falls.  She is only able ambulate short distances with her cane.  She is now living with her daughter.  She has had previous difficulty with word finding, but is very slow to answer or does not answer today.  Her affect is flat.  She has difficulty following directions.  She has a history of stroke.  I will hold her treatment today and  obtain a stat CT head without contrast for further evaluation.  We may wish to repeat an MRI brain as well.  This may be due to known chronic ischemic disease.  Her general weakness may be secondary to trastuzumab.  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  She is status post neoadjuvant trastuzumab/pertuzumab/paclitaxel.  She had an excellent response to this, with just a tiny residual invasive carcinoma. She received 4 cycles of adjuvant Kadcyla but this was discontinued due to toxicities. She went on to have single agent trastuzumab to complete 1 year of HER2 targeted therapy. Right diagnostic mammogram in June did not reveal any evidence of malignancy.  Benign postlumpectomy changes.  Benign, stable, previously biopsied right breast asymmetry.  She continued to follow with Dr. Lequita Halt as well.  In October she had right arm weakness and increased pain so underwent evaluation with MRI cervical and thoracic spine which showed bony metastasis with an epidural lesion at T7.  She received palliative radiation to T7 and the right humerus.  She was placed on trastuzumab every 3 weeks in January.  Confusion She has had difficulty with word finding, but she is not oriented today.  She has a flat affect, which is not her usual.  She is very slow to answer does not answer at all.  She looks to her daughter for answers.  She later states she cannot remember.  She has lower extremity weakness.  She has a history of stroke, as well as chronic small vessel ischemic disease and  multiple chronic infarcts.  I still recommend obtaining a CT head without contrast due to the changes.  We may also wish to obtain a repeat MRI brain.    The patient understands the plans discussed today and is in agreement with them.  She knows to contact our office if she develops concerns prior to  her next appointment.   40 minutes was spent in patient care.  This included time spent preparing to see the patient (e.g., review of tests), obtaining and/or reviewing separately obtained history, counseling and educating the patient/family/caregiver, ordering medications, tests, or procedures; documenting clinical information in the electronic or other health record, independently interpreting results and communicating results to the patient/family/caregiver as well as coordination of care.       Adah Perl, PA-C  [facility] [place] - A DEPT OF [facility] [address] [name] [address] Dept: [phone] Dept Fax: [phone]   Orders Placed This Encounter  Procedures   CT HEAD WO CONTRAST ( )    Standing Status:   Future    Number of Occurrences:   1    Expected Date:   04/26/2023    Expiration Date:   04/25/2024    Preferred imaging location?:   [facility]      CHIEF COMPLAINT:  CC: Recurrent hormone and HER2 positive breast cancer  Current Treatment: Trastuzumab every 3 weeks  HISTORY OF PRESENT ILLNESS:   Oncology History  Malignant neoplasm of upper-outer quadrant of right breast in female, estrogen receptor positive (HCC)  12/18/2019 Cancer Staging   Staging form: Breast, AJCC 8th Edition - Clinical stage from 12/18/2019: Stage IB (cT2, cN0, cM0, G3, ER+, PR+, HER2+) - Signed by [name], MD on 01/19/2020   01/08/2020 Initial Diagnosis   Breast cancer, right (HCC)   01/28/2020 - 03/26/2020 Chemotherapy    Patient is on Treatment Plan: BREAST TRASTUZUMAB + PERTUZUMAB Q21D X 11 CYCLES      06/01/2020 - 06/01/2020 Chemotherapy         06/17/2020 - 07/29/2020 Chemotherapy         09/17/2020 - 04/16/2021 Chemotherapy   Patient is on Treatment Plan : BREAST Trastuzumab q21d X 11 Cycles     01/31/2023 -  Chemotherapy   Patient is on Treatment Plan : BREAST MAINTENANCE Trastuzumab SQ (600) D1  q21d x 13 cycles     Malignant neoplasm metastatic to bone (HCC)  11/10/2022 Initial Diagnosis   Malignant neoplasm metastatic to bone (HCC)   01/31/2023 -  Chemotherapy   Patient is on Treatment Plan : BREAST MAINTENANCE Trastuzumab SQ (600) D1 q21d x 13 cycles         INTERVAL HISTORY:  Chene is here today for repeat clinical assessment prior to a 5th cycle of trastuzumab.  She is slow to answer questions or does not answer at all.  She has difficulty following directions to do a neurologic exam.  She is oriented to her daughter and the cancer center.  The patient denies headaches, vision changes, nausea and vomiting.  Her daughter states that the patient has been generally weaker and has not been able to ambulate except for short distances with her cane.  She has moved her to her home.  The patient later said she can't remember good.  She denies fevers or chills. She denies pain. Her appetite is good. She could not stand to weigh.  There has been no change in her medications.  She  has been on fentanyl 12 mcg/hr since October.  She is using tramadol 50 mg 2-3 times a day for pain.  Left hip x-ray earlier this month did not reveal any acute abnormality.  Echocardiogram on March 27 revealed a regular rhythm, mild concentric hypertrophy of the left ventricle with normal chamber size.  Left diastolic dysfunction with impaired relaxation pattern.  Left ventricular ejection fraction was 55 to 60%.  REVIEW OF SYSTEMS:  Review of Systems  Constitutional:  Negative for appetite change.  Musculoskeletal:  Positive for gait problem (difficulty ambulating, uses cane).  Neurological:  Positive for extremity weakness (legs weak) and gait problem (difficulty ambulating, uses cane). Negative for dizziness, headaches and light-headedness.     VITALS:  Blood pressure (!) 144/71, pulse 74, temperature 98.2 F (36.8 C), temperature source Oral, resp. rate 18, SpO2 97%.  Wt Readings from Last 3 Encounters:   03/14/23 128 lb 11.2 oz (58.4 kg)  02/21/23 133 lb 3.2 oz (60.4 kg)  01/31/23 138 lb (62.6 kg)    There is no height or weight on file to calculate BMI.  Performance status (ECOG): 3 - Symptomatic, >50% confined to bed  PHYSICAL EXAM:  Physical Exam Vitals and nursing note reviewed.  Constitutional:      General: She is not in acute distress.    Appearance: Normal appearance. She is ill-appearing (chronically ill-appearing).  HENT:     Head: Normocephalic and atraumatic.  Eyes:     General: No scleral icterus.       Right eye: No discharge.        Left eye: No discharge.     Extraocular Movements: Extraocular movements intact.     Conjunctiva/sclera: Conjunctivae normal.     Comments: Pupils are pinpoint  Cardiovascular:     Rate and Rhythm: Normal rate and regular rhythm.     Heart sounds: Normal heart sounds. No murmur heard.    No friction rub. No gallop.  Pulmonary:     Effort: Pulmonary effort is normal.     Breath sounds: Examination of the right-middle field reveals decreased breath sounds. Examination of the right-lower field reveals decreased breath sounds. Decreased breath sounds present. No wheezing, rhonchi or rales.  Abdominal:     General: There is no distension.     Palpations: Abdomen is soft. There is no mass.     Tenderness: There is no abdominal tenderness.  Musculoskeletal:        General: No swelling. Normal range of motion.     Cervical back: Normal range of motion and neck supple. No tenderness.     Right lower leg: No edema.     Left lower leg: No edema.  Lymphadenopathy:     Cervical: No cervical adenopathy.  Skin:    General: Skin is warm and dry.     Coloration: Skin is not jaundiced.  Neurological:     Mental Status: She is alert. She is disoriented.     Cranial Nerves: No cranial nerve deficit.     Motor: Weakness (bilateral lower extremities) present.     Gait: Gait abnormal (can't stand to weigh).  Psychiatric:        Mood and Affect:  Mood normal. Affect is flat.        Speech: Speech is delayed.        Behavior: Behavior is slowed.        Thought Content: Thought content normal.        Cognition and Memory: Cognition is impaired.  Memory is impaired.     LABS:      Latest Ref Rng & Units 04/26/2023   12:53 PM 04/05/2023   12:51 PM 03/14/2023   12:55 PM  CBC  WBC 4.0 - 10.5 K/uL 3.8  5.0  4.9   Hemoglobin 12.0 - 15.0 g/dL 60.4  54.0  98.1   Hematocrit 36.0 - 46.0 % 34.7  33.8  32.5   Platelets 150 - 400 K/uL 167  205  180       Latest Ref Rng & Units 04/26/2023   12:53 PM 04/05/2023   12:51 PM 03/14/2023   12:55 PM  CMP  Glucose 70 - 99 mg/dL 191  478  295   BUN 8 - 23 mg/dL 12  16  16    Creatinine 0.44 - 1.00 mg/dL 6.21  3.08  6.57   Sodium 135 - 145 mmol/L 138  137  137   Potassium 3.5 - 5.1 mmol/L 3.5  3.8  4.1   Chloride 98 - 111 mmol/L 100  101  102   CO2 22 - 32 mmol/L 28  26  26    Calcium 8.9 - 10.3 mg/dL 84.6  96.2  95.2   Total Protein 6.5 - 8.1 g/dL 6.1  6.5  6.5   Total Bilirubin 0.0 - 1.2 mg/dL 1.1  0.9  0.9   Alkaline Phos 38 - 126 U/L 201  201  182   AST 15 - 41 U/L 20  16  21    ALT 0 - 44 U/L 6  <5  6      Lab Results  Component Value Date   CEA 10.17 (H) 01/31/2023   /  CEA ([facility])  Date Value Ref Range Status  01/31/2023 10.17 (H) 0.00 - 5.00 ng/mL Final    Comment:    (NOTE) This test was performed using [employer] Coulter's paramagnetic chemiluminescent immunoassay. Values obtained from different assay methods cannot be used interchangeably. Please note that up to 8% of patients who smoke may see values 5.1-10.0 ng/ml and 1% of patients who smoke may see CEA levels >10.0 ng/ml. Performed at [facility], [address], [place], [name] [address]     Lab Results  Component Value Date   TOTALPROTELP 6.5 12/01/2022   ALBUMINELP 3.9 12/01/2022   A1GS 0.3 12/01/2022   A2GS 0.7 12/01/2022   BETS 0.9 12/01/2022   GAMS 0.8 12/01/2022   MSPIKE Not Observed  12/01/2022   SPEI Comment 12/01/2022   Lab Results  Component Value Date   TIBC 298 10/27/2022   FERRITIN 96 10/27/2022   IRONPCTSAT 24 10/27/2022     STUDIES:  DG HIP UNILAT WITH PELVIS 2-3 VIEWS LEFT Result Date: 04/18/2023 CLINICAL DATA:  Left hip pain for 1-1/2 weeks. History of breast cancer metastatic to bone. EXAM: DG HIP (WITH OR WITHOUT PELVIS) 2-3V LEFT COMPARISON:  PET CT 11/23/2022 reviewed FINDINGS: The bones are subjectively under mineralized. Metastatic lesions on prior PET have no definite radiographic correlate. No evidence of acute or pathologic fracture. The hip joint spaces preserved. No erosions or periostitis. Degenerative change of the pubic symphysis and sacroiliac joints. Calcified uterine fibroids and vascular calcifications in the pelvis. IMPRESSION: 1. No acute radiographic findings or explanation for hip pain. 2. Metastatic lesions on prior PET have no definite radiographic correlate. Consider MRI for further assessment given history of osseous metastatic lesions on prior PET and bony under mineralization. 3. Degenerative change of the pubic symphysis and sacroiliac joints. Electronically Signed  By: Narda Rutherford M.D.   On: 04/18/2023 15:41      HISTORY:   Past Medical History:  Diagnosis Date   Anemia 10/26/2022   Arthritis    osteoarthritis   Breast cancer (HCC)    Diabetes mellitus without complication (HCC)    not on medication   History of CVA (cerebrovascular accident)    Hypertension    Multiple thyroid nodules    benign   Right arm weakness 10/26/2022   Upper back pain 10/26/2022   Word finding difficulty 10/26/2022    Past Surgical History:  Procedure Laterality Date   BIOPSY THYROID     benign   BREAST BIOPSY     BREAST LUMPECTOMY W/ NEEDLE LOCALIZATION Right 05/04/2020    Family History  Problem Relation Age of Onset   Colon cancer Father        63s   Breast cancer Sister        48-60   Colon cancer Brother        61s    Prostate cancer Brother        late 46s   Breast cancer Half-Sister        late 55s early 59s    Social History:  reports that she has never smoked. She has never used smokeless tobacco. She reports that she does not currently use alcohol. She reports that she does not use drugs.The patient is accompanied by her daughter today.  Allergies: No Known Allergies  Current Medications: Current Outpatient Medications  Medication Sig Dispense Refill   acetaminophen (TYLENOL) 500 MG tablet Take 500 mg by mouth at bedtime.     docusate sodium (COLACE) 100 MG capsule Take 100 mg by mouth 2 (two) times daily.     fentaNYL (DURAGESIC) 12 MCG/HR Place 1 patch onto the skin every 3 (three) days. 10 patch 0   ondansetron (ZOFRAN-ODT) 4 MG disintegrating tablet Take 1 tablet (4 mg total) by mouth every 6 (six) hours as needed for nausea or vomiting. 60 tablet 3   traMADol (ULTRAM) 50 MG tablet Take 1 tablet (50 mg total) by mouth every 6 (six) hours as needed. 100 tablet 0   No current facility-administered medications for this visit.

## 2023-04-26 NOTE — Assessment & Plan Note (Signed)
 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  She is status post neoadjuvant trastuzumab/pertuzumab/paclitaxel.  She had an excellent response to this, with just a tiny residual invasive carcinoma. She received 4 cycles of adjuvant Kadcyla but this was discontinued due to toxicities. She went on to have single agent trastuzumab to complete 1 year of HER2 targeted therapy. Right diagnostic mammogram in June did not reveal any evidence of malignancy.  Benign postlumpectomy changes.  Benign, stable, previously biopsied right breast asymmetry.  She continued to follow with Dr. Lequita Halt as well.  In October she had right arm weakness and increased pain so underwent evaluation with MRI cervical and thoracic spine which showed bony metastasis with an epidural lesion at T7.  She received palliative radiation to T7 and the right humerus.  She was placed on trastuzumab every 3 weeks in January.

## 2023-04-27 ENCOUNTER — Encounter: Payer: Self-pay | Admitting: Hematology and Oncology

## 2023-04-27 ENCOUNTER — Encounter: Payer: Self-pay | Admitting: Oncology

## 2023-04-28 ENCOUNTER — Ambulatory Visit (HOSPITAL_BASED_OUTPATIENT_CLINIC_OR_DEPARTMENT_OTHER)
Admission: RE | Admit: 2023-04-28 | Discharge: 2023-04-28 | Disposition: A | Discharge status: Home / Self Care | Source: Ambulatory Visit | Attending: Hematology and Oncology | Admitting: Hematology and Oncology

## 2023-04-28 DIAGNOSIS — J90 Pleural effusion, not elsewhere classified: Secondary | ICD-10-CM | POA: Diagnosis not present

## 2023-04-28 DIAGNOSIS — R0689 Other abnormalities of breathing: Secondary | ICD-10-CM

## 2023-05-01 ENCOUNTER — Encounter: Payer: Self-pay | Admitting: Oncology

## 2023-05-03 ENCOUNTER — Other Ambulatory Visit: Payer: Self-pay | Admitting: Oncology

## 2023-05-03 DIAGNOSIS — C7951 Secondary malignant neoplasm of bone: Secondary | ICD-10-CM

## 2023-05-04 ENCOUNTER — Other Ambulatory Visit (HOSPITAL_BASED_OUTPATIENT_CLINIC_OR_DEPARTMENT_OTHER): Admitting: Radiology

## 2023-05-04 ENCOUNTER — Ambulatory Visit (INDEPENDENT_AMBULATORY_CARE_PROVIDER_SITE_OTHER)
Admission: RE | Admit: 2023-05-04 | Discharge: 2023-05-04 | Disposition: A | Discharge status: Home / Self Care | Source: Ambulatory Visit | Attending: Hematology and Oncology | Admitting: Hematology and Oncology

## 2023-05-04 DIAGNOSIS — R410 Disorientation, unspecified: Secondary | ICD-10-CM

## 2023-05-04 DIAGNOSIS — C50411 Malignant neoplasm of upper-outer quadrant of right female breast: Secondary | ICD-10-CM | POA: Diagnosis not present

## 2023-05-04 DIAGNOSIS — Z170 Estrogen receptor positive status [ER+]: Secondary | ICD-10-CM

## 2023-05-04 DIAGNOSIS — R4182 Altered mental status, unspecified: Secondary | ICD-10-CM

## 2023-05-04 DIAGNOSIS — C7951 Secondary malignant neoplasm of bone: Secondary | ICD-10-CM

## 2023-05-04 MED ORDER — GADOBUTROL 1 MMOL/ML IV SOLN
5.5000 mL | Freq: Once | INTRAVENOUS | Status: AC | PRN
Start: 2023-05-04 — End: 2023-05-04
  Administered 2023-05-04: 5.5 mL via INTRAVENOUS

## 2023-05-08 ENCOUNTER — Other Ambulatory Visit: Payer: Self-pay | Admitting: Oncology

## 2023-05-08 DIAGNOSIS — Z170 Estrogen receptor positive status [ER+]: Secondary | ICD-10-CM

## 2023-05-16 NOTE — Progress Notes (Signed)
 [facility]  [address] [place],  [name]  [address] [phone]  Clinic Day:  05/17/23  Referring physician: Magdaleno Schooling, MD  ASSESSMENT & PLAN:  Assessment: Malignant neoplasm metastatic to bone [facility]) In October 2024, she reported right arm weakness, so underwent MRI head, cervical spine and thoracic spine.  MRI cervical spine revealed subcentimeter hyperintense lesion at C6 suspicious for metastatic disease with multilevel degenerative changes throughout the cervical spine.  MRI thoracic spine revealed multiple hyperintense lesions throughout this thoracic spine with epidural tumor extension at T7 and mild spinal canal narrowing MRI head did not reveal any acute intracranial abnormality.  Moderate to severe chronic small vessel ischemic disease with multiple chronic infarcts was seen.  PET scan revealed a small hypermetabolic focus in the right lateral breast consistent with a small focus of breast cancer.  Diffuse hypermetabolic skeletal metastasis seen.  There is focal hypermetabolic activity near the left tonsil/tongue base and scattered hypermetabolic foci within the colon.  MRI lumbar spine in November revealed multiple focal signal abnormality scattered throughout the spine consistent with osseous metastasis.  She was given fentanyl  12 mcg/h and tramadol  for her pain.  She received radiation to T6-7 and the right humerus with improvement in her pain.  She was started on trastuzumab  every 3 weeks in January with continued improvement in her pain.   She has had progressive weakness over the past 1 to 2 months and has sustained falls.  She is only able ambulate short distances with her cane.  She is now living with her daughter.  She has had previous difficulty with word finding, but is very slow to answer or does not answer today.  Her affect is flat.  She has difficulty following directions.  She has a history of stroke.  Her treatment was held and CT head without contrast  was obtained for further evaluation.  This reveals atrophy and old infarctions, due to known chronic ischemic disease.  Her general weakness may be secondary to trastuzumab . We will now stop these treatments due to her severe decline and focus on comfort care. I will refer her to Hospice.    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  She is status post neoadjuvant trastuzumab /pertuzumab /paclitaxel .  She had an excellent response to this, with just a tiny residual invasive carcinoma. She received 4 cycles of adjuvant Kadcyla  but this was discontinued due to toxicities. She went on to have single agent trastuzumab  to complete 1 year of HER2 targeted therapy. Right diagnostic mammogram in June did not reveal any evidence of malignancy.  Benign postlumpectomy changes.  Benign, stable, previously biopsied right breast asymmetry.  She continued to follow with Dr. Britta Candy as well.   In October she had right arm weakness and increased pain so underwent evaluation with MRI cervical and thoracic spine which showed bony metastasis with an epidural lesion at T7.  She received palliative radiation to T7 and the right humerus.  She was placed on trastuzumab  every 3 weeks in January, which we will now stop and refer her to Hospice.   Confusion She has had difficulty with word finding, but she is not oriented today.  She has a flat affect, which is not her usual.  She is very slow to answer does not answer at all.  She looks to her daughter for answers.  She later states she cannot remember.  She has lower extremity weakness.  She has a history of stroke, as well as chronic small vessel ischemic disease and multiple chronic infarcts. CT head done on 04/26/2023 revealed no acute intracranial process, atrophy and chronic small vessel ischemic changes, and small old infarcts in the  bilateral cerebellum and left frontal lobe. Chest x-ray was negative for acute cardiopulmonary disease. She then had a MRI of the head done on 05/04/2023 but report is pending. Her hypercalcemia is mild but could be contributing to her mental status.   Plan:  CT head done on 04/26/2023 revealed no acute intracranial process, atrophy and chronic small vessel ischemic changes, and small old infarcts in the bilateral cerebellum and left frontal lobe. Chest x-ray was negative for acute cardiopulmonary disease. She then had a MRI of the head done on 05/04/2023 but report is pending. She states that her current pain medication does not help as much. She takes Gabapentin 100 mg twice daily and tramadol  50 mg Q6 hours prn, usually 3 daily. She continues to use her fentanyl  patch 12 mcg once every 3 days. I recommended we increase her patch instead of the pills as I am worried about the side effects of the pills. I will increase her fentanyl  patch to 25 mcg once every 3 days. Her daughter has moved her into her home and informed me that she has quit walking about 2 weeks ago and tries to hit her, her memory has decreased and she also has no motivation to get out of bed or do many things. The patient has increased lethargy, confusion, and occasionally combative. I inquired about stopping her treatment and switching to palliative care to her daughter. Her daughter states that her mother's mental state leaves her confused majority of the day. I informed them about Hospice and explained their services to help her daughter and her mother at home, I also explained that she cannot be on hospice as long as she is on treatment. She does have a DNR signed already.  Her trastuzumab  will be stopped indefinitely. She has a WBC of 4.5, low hemoglobin of 11.9 improved from 11.6, and platelet count of 176,000. Her CMP is fairly normal other than a low sodium of 134, elevated calcium of 11.0, and alkaline phosphatase of 228. We will not  be aggressive with treating her hypercalcemia. I will see her back in 3 weeks with CBC and CMP and provide supportive care. The patient seems to understand the plans discussed today and is in agreement with them.  She knows to contact our office if she develops concerns prior to her next appointment.  I provided 30 minutes of face-to-face time during this this encounter and > 50% was spent counseling as documented under my assessment and plan.   Nolia Baumgartner, MD  [facility] - A DEPT OF [facility] [address] [name] [address] Dept: [phone] Dept Fax: [phone]   Orders Placed This Encounter  Procedures   Ambulatory referral to Social Work    Referral Priority:   Routine    Referral Type:   Consultation    Referral Reason:   Specialty Services Required    Number of Visits Requested:   1    CHIEF COMPLAINT:  CC: Recurrent hormone and HER2 positive breast cancer  Current Treatment: Trastuzumab  every 3 weeks  HISTORY OF PRESENT ILLNESS:   Oncology History  Malignant neoplasm of upper-outer quadrant of right breast in female, estrogen  receptor positive (HCC)  12/18/2019 Cancer Staging   Staging form: Breast, AJCC 8th Edition - Clinical stage from 12/18/2019: Stage IB (cT2, cN0, cM0, G3, ER+, PR+, HER2+) - Signed by Nolia Baumgartner, MD on 01/19/2020   01/08/2020 Initial Diagnosis   Breast cancer, right (HCC)   01/28/2020 - 03/26/2020 Chemotherapy    Patient is on Treatment Plan: BREAST TRASTUZUMAB  + PERTUZUMAB  Q21D X 11 CYCLES      06/01/2020 - 06/01/2020 Chemotherapy         06/17/2020 - 07/29/2020 Chemotherapy         09/17/2020 - 04/16/2021 Chemotherapy   Patient is on Treatment Plan : BREAST Trastuzumab  q21d X 11 Cycles     01/31/2023 -  Chemotherapy   Patient is on Treatment Plan : BREAST MAINTENANCE Trastuzumab  SQ (600) D1 q21d x 13 cycles     Malignant neoplasm metastatic to bone (HCC)   11/10/2022 Initial Diagnosis   Malignant neoplasm metastatic to bone (HCC)   01/31/2023 -  Chemotherapy   Patient is on Treatment Plan : BREAST MAINTENANCE Trastuzumab  SQ (600) D1 q21d x 13 cycles      INTERVAL HISTORY:  Kumiko is here today for repeat clinical assessment for her recurrent hormone and HER2 positive breast cancer. Her daughter also informed me that last time she saw Alejandra Amos she was experiencing headaches and confusion and was recommended CT head for further evaluation. CT head done on 04/26/2023 revealed no acute intracranial process, atrophy and chronic small vessel ischemic changes, and small old infarcts in the bilateral cerebellum and left frontal lobe. Chest x-ray was negative for acute cardiopulmonary disease. She then had a MRI of the head done on 05/04/2023 but report is pending. Patient states that she feels ok but complains of fatigue and bilateral shoulder pain. She states that her current pain medication does not help as much. She takes Gabapentin 100 mg twice daily and tramadol  50 mg Q6 hours prn, usually 3 daily. She continues to use her fentanyl  patch 12 mcg once every 3 days. I recommended we increase her patch instead of the pills as I am worried about the side effects of the pills. I will increase her fentanyl  patch to 25 mcg once every 3 days. Her daughter has moved her into her home and informed me that she has quit walking about 2 weeks ago and tries to hit her, her memory has decreased and she also has no motivation to get out of bed or do many things. The patient has increased lethargy, confusion, and occasionally combative. I inquired about stopping her treatment and switching to palliative care to her daughter. Her daughter states that her mother's mental state leaves her confused the majority of the day. I informed them about Hospice and explained their services to help her daughter and her mother at home, I also explained that she cannot be on hospice as long as she is  on treatment. She does have a DNR signed already.  Her trastuzumab  was scheduled on 04/26/2023 and this will be stopped indefinitely. She has a WBC of 4.5, low hemoglobin of 11.9 improved from 11.6, and platelet count of 176,000. Her CMP is fairly normal other than a low sodium of 134, elevated calcium of 11.0, and alkaline phosphatase of 228. I will see her back in 3 weeks with CBC and CMP for supportive care.   She denies signs of infection such as sore throat, sinus drainage, cough, or urinary symptoms.  She denies fevers or recurrent chills. She  denies nausea, vomiting, chest pain, dyspnea or cough. Her appetite is poor. This patient is accompanied in the office by her daughter.   REVIEW OF SYSTEMS:  Review of Systems  Constitutional:  Positive for appetite change and fatigue. Negative for chills, diaphoresis, fever and unexpected weight change.  HENT:   Positive for hearing loss. Negative for lump/mass, mouth sores, nosebleeds, sore throat, tinnitus, trouble swallowing and voice change.   Eyes: Negative.  Negative for eye problems and icterus.  Respiratory: Negative.  Negative for chest tightness, cough, hemoptysis, shortness of breath and wheezing.   Cardiovascular: Negative.  Negative for chest pain, leg swelling and palpitations.  Gastrointestinal: Negative.  Negative for abdominal distention, abdominal pain, blood in stool, constipation, diarrhea, nausea, rectal pain and vomiting.  Endocrine: Negative.   Genitourinary: Negative.  Negative for bladder incontinence, difficulty urinating, dyspareunia, dysuria, frequency, hematuria, menstrual problem, nocturia, pelvic pain, vaginal bleeding and vaginal discharge.   Musculoskeletal:  Positive for gait problem (difficulty ambulating, uses cane). Negative for arthralgias, back pain, flank pain, myalgias, neck pain and neck stiffness.       Bilateral shoulder pain  Skin: Negative.  Negative for itching, rash and wound.  Neurological:  Positive for  extremity weakness (legs weak) and gait problem (difficulty ambulating, uses cane). Negative for dizziness, headaches, light-headedness, numbness, seizures and speech difficulty.  Hematological: Negative.  Negative for adenopathy. Does not bruise/bleed easily.  Psychiatric/Behavioral:  Positive for confusion (increased). Negative for decreased concentration, depression, sleep disturbance and suicidal ideas. The patient is not nervous/anxious.     VITALS:  Blood pressure 131/68, pulse 76, resp. rate 14, SpO2 93%.  Wt Readings from Last 3 Encounters:  03/14/23 128 lb 11.2 oz (58.4 kg)  02/21/23 133 lb 3.2 oz (60.4 kg)  01/31/23 138 lb (62.6 kg)    There is no height or weight on file to calculate BMI.  Performance status (ECOG): 3 - Symptomatic, >50% confined to bed  PHYSICAL EXAM:  Physical Exam Vitals and nursing note reviewed. Exam conducted with a chaperone present.  Constitutional:      General: She is not in acute distress.    Appearance: Normal appearance. She is normal weight. She is ill-appearing (chronically ill-appearing). She is not diaphoretic.  HENT:     Head: Normocephalic and atraumatic.     Right Ear: Tympanic membrane, ear canal and external ear normal. There is no impacted cerumen.     Left Ear: Tympanic membrane, ear canal and external ear normal. There is no impacted cerumen.     Nose: Nose normal. No congestion or rhinorrhea.     Mouth/Throat:     Mouth: Mucous membranes are moist.     Pharynx: Oropharynx is clear. No oropharyngeal exudate or posterior oropharyngeal erythema.  Eyes:     General: No scleral icterus.       Right eye: No discharge.        Left eye: No discharge.     Extraocular Movements: Extraocular movements intact.     Conjunctiva/sclera: Conjunctivae normal.  Neck:     Vascular: No carotid bruit.  Cardiovascular:     Rate and Rhythm: Normal rate and regular rhythm.     Pulses: Normal pulses.     Heart sounds: Normal heart sounds. No murmur  heard.    No friction rub. No gallop.  Pulmonary:     Effort: Pulmonary effort is normal. No respiratory distress.     Breath sounds: Normal breath sounds. No stridor. No wheezing, rhonchi or rales.  Chest:     Chest wall: No tenderness.  Abdominal:     General: Bowel sounds are normal. There is no distension.     Palpations: Abdomen is soft. There is no mass.     Tenderness: There is no abdominal tenderness. There is no right CVA tenderness, left CVA tenderness, guarding or rebound.     Hernia: No hernia is present.  Musculoskeletal:        General: No swelling, tenderness, deformity or signs of injury. Normal range of motion.     Cervical back: Normal range of motion and neck supple. No rigidity or tenderness.     Right lower leg: No edema.     Left lower leg: No edema.  Lymphadenopathy:     Cervical: No cervical adenopathy.  Skin:    General: Skin is warm and dry.     Coloration: Skin is not jaundiced or pale.     Findings: No bruising, erythema, lesion or rash.  Neurological:     Mental Status: She is alert. Mental status is at baseline. She is disoriented.     Cranial Nerves: No cranial nerve deficit.     Sensory: No sensory deficit.     Motor: Weakness (bilateral lower extremities) present.     Coordination: Coordination normal.     Gait: Gait abnormal (can't stand to weigh).     Deep Tendon Reflexes: Reflexes normal.  Psychiatric:        Mood and Affect: Affect is flat.        Speech: Speech is delayed.        Behavior: Behavior is slowed.        Cognition and Memory: Cognition is impaired. Memory is impaired.     LABS:      Latest Ref Rng & Units 05/17/2023   12:53 PM 04/26/2023   12:53 PM 04/05/2023   12:51 PM  CBC  WBC 4.0 - 10.5 K/uL 4.5  3.8  5.0   Hemoglobin 12.0 - 15.0 g/dL 34.7  42.5  95.6   Hematocrit 36.0 - 46.0 % 37.6  34.7  33.8   Platelets 150 - 400 K/uL 176  167  205       Latest Ref Rng & Units 05/17/2023   12:53 PM 04/26/2023   12:53 PM 04/05/2023    12:51 PM  CMP  Glucose 70 - 99 mg/dL 387  564  332   BUN 8 - 23 mg/dL 13  12  16    Creatinine 0.44 - 1.00 mg/dL 9.51  8.84  1.66   Sodium 135 - 145 mmol/L 134  138  137   Potassium 3.5 - 5.1 mmol/L 3.7  3.5  3.8   Chloride 98 - 111 mmol/L 98  100  101   CO2 22 - 32 mmol/L 27  28  26    Calcium 8.9 - 10.3 mg/dL 06.3  01.6  01.0   Total Protein 6.5 - 8.1 g/dL 6.8  6.1  6.5   Total Bilirubin 0.0 - 1.2 mg/dL 1.5  1.1  0.9   Alkaline Phos 38 - 126 U/L 228  201  201   AST 15 - 41 U/L 25  20  16    ALT 0 - 44 U/L 7  6  <5      Lab Results  Component Value Date   CEA 10.17 (H) 01/31/2023   /  CEA ([facility])  Date Value Ref Range Status  01/31/2023 10.17 (H) 0.00 - 5.00 ng/mL Final  Comment:    (NOTE) This test was performed using Beckman Coulter's paramagnetic chemiluminescent immunoassay. Values obtained from different assay methods cannot be used interchangeably. Please note that up to 8% of patients who smoke may see values 5.1-10.0 ng/ml and 1% of patients who smoke may see CEA levels >10.0 ng/ml. Performed at [facility], [address], [place], [name] [address]     Lab Results  Component Value Date   TOTALPROTELP 6.5 12/01/2022   ALBUMINELP 3.9 12/01/2022   A1GS 0.3 12/01/2022   A2GS 0.7 12/01/2022   BETS 0.9 12/01/2022   GAMS 0.8 12/01/2022   MSPIKE Not Observed 12/01/2022   SPEI Comment 12/01/2022   Lab Results  Component Value Date   TIBC 298 10/27/2022   FERRITIN 96 10/27/2022   IRONPCTSAT 24 10/27/2022     STUDIES:  No results found.     HISTORY:   Past Medical History:  Diagnosis Date   Anemia 10/26/2022   Arthritis    osteoarthritis   Breast cancer (HCC)    Diabetes mellitus without complication (HCC)    not on medication   History of CVA (cerebrovascular accident)    Hypertension    Multiple thyroid  nodules    benign   Right arm weakness 10/26/2022   Upper back pain 10/26/2022   Word finding difficulty 10/26/2022     Past Surgical History:  Procedure Laterality Date   BIOPSY THYROID      benign   BREAST BIOPSY     BREAST LUMPECTOMY W/ NEEDLE LOCALIZATION Right 05/04/2020    Family History  Problem Relation Age of Onset   Colon cancer Father        34s   Breast cancer Sister        71-60   Colon cancer Brother        22s   Prostate cancer Brother        late 77s   Breast cancer Half-Sister        late 67s early 60s    Social History:  reports that she has never smoked. She has never used smokeless tobacco. She reports that she does not currently use alcohol. She reports that she does not use drugs.The patient is accompanied by her daughter today.  Allergies: No Known Allergies  Current Medications: Current Outpatient Medications  Medication Sig Dispense Refill   acetaminophen  (TYLENOL ) 500 MG tablet Take 500 mg by mouth at bedtime.     docusate sodium (COLACE) 100 MG capsule Take 100 mg by mouth 2 (two) times daily.     gabapentin (NEURONTIN) 100 MG capsule Take 100 mg by mouth at bedtime as needed.     traMADol  (ULTRAM ) 50 MG tablet Take 1 tablet (50 mg total) by mouth every 6 (six) hours as needed. 100 tablet 0   fentaNYL  (DURAGESIC ) 25 MCG/HR Place 1 patch onto the skin every 3 (three) days. 5 patch 0   ondansetron  (ZOFRAN -ODT) 4 MG disintegrating tablet Take 1 tablet (4 mg total) by mouth every 6 (six) hours as needed for nausea or vomiting. (Patient not taking: Reported on 05/17/2023) 60 tablet 3   No current facility-administered medications for this visit.

## 2023-05-17 ENCOUNTER — Inpatient Hospital Stay (HOSPITAL_BASED_OUTPATIENT_CLINIC_OR_DEPARTMENT_OTHER): Admitting: Oncology

## 2023-05-17 ENCOUNTER — Other Ambulatory Visit: Payer: Self-pay | Admitting: Oncology

## 2023-05-17 ENCOUNTER — Telehealth: Payer: Self-pay | Admitting: Oncology

## 2023-05-17 ENCOUNTER — Inpatient Hospital Stay

## 2023-05-17 VITALS — BP 131/68 | HR 76 | Resp 14

## 2023-05-17 DIAGNOSIS — C50411 Malignant neoplasm of upper-outer quadrant of right female breast: Secondary | ICD-10-CM

## 2023-05-17 DIAGNOSIS — G893 Neoplasm related pain (acute) (chronic): Secondary | ICD-10-CM

## 2023-05-17 DIAGNOSIS — Z170 Estrogen receptor positive status [ER+]: Secondary | ICD-10-CM

## 2023-05-17 DIAGNOSIS — R410 Disorientation, unspecified: Secondary | ICD-10-CM

## 2023-05-17 DIAGNOSIS — Z79899 Other long term (current) drug therapy: Secondary | ICD-10-CM

## 2023-05-17 DIAGNOSIS — C7951 Secondary malignant neoplasm of bone: Secondary | ICD-10-CM

## 2023-05-17 DIAGNOSIS — Z923 Personal history of irradiation: Secondary | ICD-10-CM

## 2023-05-17 LAB — CBC WITH DIFFERENTIAL (CANCER CENTER ONLY)
Abs Immature Granulocytes: 0.05 10*3/uL (ref 0.00–0.07)
Basophils Absolute: 0.1 10*3/uL (ref 0.0–0.1)
Basophils Relative: 2 %
Eosinophils Absolute: 0.2 10*3/uL (ref 0.0–0.5)
Eosinophils Relative: 4 %
HCT: 37.6 % (ref 36.0–46.0)
Hemoglobin: 11.9 g/dL — ABNORMAL LOW (ref 12.0–15.0)
Immature Granulocytes: 1 %
Lymphocytes Relative: 15 %
Lymphs Abs: 0.7 10*3/uL (ref 0.7–4.0)
MCH: 29.3 pg (ref 26.0–34.0)
MCHC: 31.6 g/dL (ref 30.0–36.0)
MCV: 92.6 fL (ref 80.0–100.0)
Monocytes Absolute: 0.4 10*3/uL (ref 0.1–1.0)
Monocytes Relative: 8 %
Neutro Abs: 3.2 10*3/uL (ref 1.7–7.7)
Neutrophils Relative %: 70 %
Platelet Count: 176 10*3/uL (ref 150–400)
RBC: 4.06 MIL/uL (ref 3.87–5.11)
RDW: 14.1 % (ref 11.5–15.5)
WBC Count: 4.5 10*3/uL (ref 4.0–10.5)
nRBC: 0 % (ref 0.0–0.2)
nRBC: 0 /100{WBCs}

## 2023-05-17 LAB — CMP (CANCER CENTER ONLY)
ALT: 7 U/L (ref 0–44)
AST: 25 U/L (ref 15–41)
Albumin: 4 g/dL (ref 3.5–5.0)
Alkaline Phosphatase: 228 U/L — ABNORMAL HIGH (ref 38–126)
Anion gap: 10 (ref 5–15)
BUN: 13 mg/dL (ref 8–23)
CO2: 27 mmol/L (ref 22–32)
Calcium: 11 mg/dL — ABNORMAL HIGH (ref 8.9–10.3)
Chloride: 98 mmol/L (ref 98–111)
Creatinine: 0.83 mg/dL (ref 0.44–1.00)
GFR, Estimated: >60 mL/min (ref >60)
Glucose, Bld: 146 mg/dL — ABNORMAL HIGH (ref 70–99)
Potassium: 3.7 mmol/L (ref 3.5–5.1)
Sodium: 134 mmol/L — ABNORMAL LOW (ref 135–145)
Total Bilirubin: 1.5 mg/dL — ABNORMAL HIGH (ref 0.0–1.2)
Total Protein: 6.8 g/dL (ref 6.5–8.1)

## 2023-05-17 MED ORDER — FENTANYL 25 MCG/HR TD PT72: 1.0000 | MEDICATED_PATCH | TRANSDERMAL | 0 refills | Status: DC

## 2023-05-17 NOTE — Telephone Encounter (Signed)
 Patient has been scheduled for follow-up visit per 05/16/23 LOS.  Pt given an appt calendar with date and time.

## 2023-05-18 ENCOUNTER — Telehealth: Payer: Self-pay

## 2023-05-18 ENCOUNTER — Other Ambulatory Visit: Payer: Self-pay

## 2023-05-18 NOTE — Telephone Encounter (Signed)
-----   Message from Nolia Baumgartner sent at 05/17/2023  3:24 PM EDT ----- Regarding: hospice Pls refer [facility] - elderly woman with breast cancer met to bone, has been on palliative treatment but declining physicall and mentally. She is DNR.  Pain not controlled so I am increasing the fentaniyl from 12 to 25mcg q 3 d and using tramadol  for breakthrough pain.  Daughter has brought her to live with her but even some combative behavior on occasion.

## 2023-05-18 NOTE — Telephone Encounter (Signed)
 Faxed referral to [facility] on 05/17/23 via Fax.

## 2023-05-18 NOTE — Telephone Encounter (Signed)
 [facility] Clinical Social Work  Clinical Social Work was referred by medical provider for assistance with setting up a living will.  Clinical Social Worker attempted to contact patient by phone to offer support and assess for needs. . No answer unable to leave vm. Will attempt again.   Maudie Sorrow, LCSW  Clinical Social Worker [facility]

## 2023-05-19 ENCOUNTER — Telehealth: Payer: Self-pay

## 2023-05-19 NOTE — Telephone Encounter (Signed)
 [facility] Clinical Social Work  Clinical Social Work was referred by nurse for assessment of psychosocial needs Engineer, building services).  Clinical Social Worker  attempted to contact patients  to offer support and assess for needs. CSW unable to reach patient's caregiver. Referral will be closed, can be reopened if needed.    Maudie Sorrow, LCSW  Clinical Social Worker [facility]

## 2023-05-27 ENCOUNTER — Other Ambulatory Visit: Payer: Self-pay | Admitting: Oncology

## 2023-05-27 DIAGNOSIS — C7951 Secondary malignant neoplasm of bone: Secondary | ICD-10-CM

## 2023-05-28 ENCOUNTER — Encounter: Payer: Self-pay | Admitting: Oncology

## 2023-06-01 ENCOUNTER — Telehealth: Payer: Self-pay

## 2023-06-01 ENCOUNTER — Other Ambulatory Visit: Payer: Self-pay

## 2023-06-01 DIAGNOSIS — K5900 Constipation, unspecified: Secondary | ICD-10-CM

## 2023-06-01 DIAGNOSIS — C7951 Secondary malignant neoplasm of bone: Secondary | ICD-10-CM

## 2023-06-01 DIAGNOSIS — M549 Dorsalgia, unspecified: Secondary | ICD-10-CM

## 2023-06-01 DIAGNOSIS — M545 Low back pain, unspecified: Secondary | ICD-10-CM

## 2023-06-01 MED ORDER — FENTANYL 25 MCG/HR TD PT72
1.0000 | MEDICATED_PATCH | TRANSDERMAL | 0 refills | Status: DC
Start: 2023-06-01 — End: 2023-06-12

## 2023-06-01 MED ORDER — BISACODYL 10 MG RE SUPP: 10.0000 mg | RECTAL | 0 refills | Status: DC | PRN

## 2023-06-01 NOTE — Telephone Encounter (Signed)
-----   Message from Nolia Baumgartner sent at 06/01/2023  8:56 AM EDT ----- Regarding: call Tell daughter we do see cancer spots in the skull bone but not in the brain itself

## 2023-06-07 ENCOUNTER — Inpatient Hospital Stay

## 2023-06-07 ENCOUNTER — Inpatient Hospital Stay: Admitting: Oncology

## 2023-06-07 NOTE — Progress Notes (Incomplete)
 [facility]  [address] [place],  [name]  [address] [phone]  Clinic Day:  06/07/23   Referring physician: Magdaleno Schooling, MD  ASSESSMENT & PLAN:  Assessment: Malignant neoplasm metastatic to bone [facility]) In October 2024, she reported right arm weakness, so underwent MRI head, cervical spine and thoracic spine.  MRI cervical spine revealed subcentimeter hyperintense lesion at C6 suspicious for metastatic disease with multilevel degenerative changes throughout the cervical spine.  MRI thoracic spine revealed multiple hyperintense lesions throughout this thoracic spine with epidural tumor extension at T7 and mild spinal canal narrowing MRI head did not reveal any acute intracranial abnormality.  Moderate to severe chronic small vessel ischemic disease with multiple chronic infarcts was seen.  PET scan revealed a small hypermetabolic focus in the right lateral breast consistent with a small focus of breast cancer.  Diffuse hypermetabolic skeletal metastasis seen.  There is focal hypermetabolic activity near the left tonsil/tongue base and scattered hypermetabolic foci within the colon.  MRI lumbar spine in November revealed multiple focal signal abnormality scattered throughout the spine consistent with osseous metastasis.  She was given fentanyl  12 mcg/h and tramadol  for her pain.  She received radiation to T6-7 and the right humerus with improvement in her pain.  She was started on trastuzumab  every 3 weeks in January with continued improvement in her pain.   She has had progressive weakness over the past 1 to 2 months and has sustained falls.  She is only able ambulate short distances with her cane.  She is now living with her daughter.  She has had previous difficulty with word finding, but is very slow to answer or does not answer today.  Her affect is flat.  She has difficulty following directions.  She has a history of stroke.  Her treatment was held and CT head without contrast  was obtained for further evaluation.  This reveals atrophy and old infarctions, due to known chronic ischemic disease.  Her general weakness may be secondary to trastuzumab . We will now stop these treatments due to her severe decline and focus on comfort care. I will refer her to Hospice.    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  She is status post neoadjuvant trastuzumab /pertuzumab /paclitaxel .  She had an excellent response to this, with just a tiny residual invasive carcinoma. She received 4 cycles of adjuvant Kadcyla  but this was discontinued due to toxicities. She went on to have single agent trastuzumab  to complete 1 year of HER2 targeted therapy. Right diagnostic mammogram in June did not reveal any evidence of malignancy.  Benign postlumpectomy changes.  Benign, stable, previously biopsied right breast asymmetry.  She continued to follow with Dr. Britta Candy as well.   In October she had right arm weakness and increased pain so underwent evaluation with MRI cervical and thoracic spine which showed bony metastasis with an epidural lesion at T7.  She received palliative radiation to T7 and the right humerus.  She was placed on trastuzumab  every 3 weeks in January, which we will now stop and refer her to Hospice.   Confusion She has had difficulty with word finding, but she is not oriented today.  She has a flat affect, which is not her usual.  She is very slow to answer does not answer at all.  She looks to her daughter for answers.  She later states she cannot  remember.  She has lower extremity weakness.  She has a history of stroke, as well as chronic small vessel ischemic disease and multiple chronic infarcts. CT head done on 04/26/2023 revealed no acute intracranial process, atrophy and chronic small vessel ischemic changes, and small old infarcts in the  bilateral cerebellum and left frontal lobe. Chest x-ray was negative for acute cardiopulmonary disease. She then had a MRI of the head done on 05/04/2023 but report is pending. Her hypercalcemia is mild but could be contributing to her mental status.   Plan:  CT head done on 04/26/2023 revealed no acute intracranial process, atrophy and chronic small vessel ischemic changes, and small old infarcts in the bilateral cerebellum and left frontal lobe. Chest x-ray was negative for acute cardiopulmonary disease. She then had a MRI of the head done on 05/04/2023 but report is pending. She states that her current pain medication does not help as much. She takes Gabapentin 100 mg twice daily and tramadol  50 mg Q6 hours prn, usually 3 daily. She continues to use her fentanyl  patch 12 mcg once every 3 days. I recommended we increase her patch instead of the pills as I am worried about the side effects of the pills. I will increase her fentanyl  patch to 25 mcg once every 3 days. Her daughter has moved her into her home and informed me that she has quit walking about 2 weeks ago and tries to hit her, her memory has decreased and she also has no motivation to get out of bed or do many things. The patient has increased lethargy, confusion, and occasionally combative. I inquired about stopping her treatment and switching to palliative care to her daughter. Her daughter states that her mother's mental state leaves her confused majority of the day. I informed them about Hospice and explained their services to help her daughter and her mother at home, I also explained that she cannot be on hospice as long as she is on treatment. She does have a DNR signed already.  Her trastuzumab  will be stopped indefinitely. She has a WBC of 4.5, low hemoglobin of 11.9 improved from 11.6, and platelet count of 176,000. Her CMP is fairly normal other than a low sodium of 134, elevated calcium of 11.0, and alkaline phosphatase of 228. We will not  be aggressive with treating her hypercalcemia. I will see her back in 3 weeks with CBC and CMP and provide supportive care. The patient seems to understand the plans discussed today and is in agreement with them.  She knows to contact our office if she develops concerns prior to her next appointment.  I provided 30 minutes of face-to-face time during this this encounter and > 50% was spent counseling as documented under my assessment and plan.   Nolia Baumgartner, MD  [facility] - A DEPT OF [facility] [address] [name] [address] Dept: [phone] Dept Fax: [phone]   No orders of the defined types were placed in this encounter.   CHIEF COMPLAINT:  CC: Recurrent hormone and HER2 positive breast cancer  Current Treatment: Trastuzumab  every 3 weeks  HISTORY OF PRESENT ILLNESS:   Oncology History  Malignant neoplasm of upper-outer quadrant of right breast in female, estrogen receptor positive (HCC)  12/18/2019 Cancer Staging   Staging form: Breast, AJCC 8th Edition - Clinical stage from 12/18/2019: Stage IB (cT2, cN0, cM0, G3, ER+, PR+, HER2+) - Signed by Nolia Baumgartner, MD on  01/19/2020   01/08/2020 Initial Diagnosis   Breast cancer, right (HCC)   01/28/2020 - 03/26/2020 Chemotherapy    Patient is on Treatment Plan: BREAST TRASTUZUMAB  + PERTUZUMAB  Q21D X 11 CYCLES      06/01/2020 - 06/01/2020 Chemotherapy         06/17/2020 - 07/29/2020 Chemotherapy         09/17/2020 - 04/16/2021 Chemotherapy   Patient is on Treatment Plan : BREAST Trastuzumab  q21d X 11 Cycles     01/31/2023 -  Chemotherapy   Patient is on Treatment Plan : BREAST MAINTENANCE Trastuzumab  SQ (600) D1 q21d x 13 cycles     Malignant neoplasm metastatic to bone (HCC)  11/10/2022 Initial Diagnosis   Malignant neoplasm metastatic to bone (HCC)   01/31/2023 -  Chemotherapy   Patient is on Treatment Plan : BREAST MAINTENANCE Trastuzumab  SQ (600)  D1 q21d x 13 cycles      INTERVAL HISTORY:  Yessica is here today for repeat clinical assessment for her recurrent hormone and HER2 positive breast cancer. Her daughter also informed me that last time she saw Alejandra Amos she was experiencing headaches and confusion and was recommended CT head for further evaluation. CT head done on 04/26/2023 revealed no acute intracranial process, atrophy and chronic small vessel ischemic changes, and small old infarcts in the bilateral cerebellum and left frontal lobe. Chest x-ray was negative for acute cardiopulmonary disease. She then had a MRI of the head done on 05/04/2023 but report is pending.    Patient states that she feels *** and ***.      She denies fever, chills, night sweats, or other signs of infection. She denies cardiorespiratory and gastrointestinal issues. She  denies pain. Her appetite is *** and her weight {Weight change:10426}.  Patient states that she feels ok but complains of fatigue and bilateral shoulder pain. She states that her current pain medication does not help as much. She takes Gabapentin 100 mg twice daily and tramadol  50 mg Q6 hours prn, usually 3 daily. She continues to use her fentanyl  patch 12 mcg once every 3 days. I recommended we increase her patch instead of the pills as I am worried about the side effects of the pills. I will increase her fentanyl  patch to 25 mcg once every 3 days. Her daughter has moved her into her home and informed me that she has quit walking about 2 weeks ago and tries to hit her, her memory has decreased and she also has no motivation to get out of bed or do many things. The patient has increased lethargy, confusion, and occasionally combative. I inquired about stopping her treatment and switching to palliative care to her daughter. Her daughter states that her mother's mental state leaves her confused the majority of the day. I informed them about Hospice and explained their services to help her daughter and  her mother at home, I also explained that she cannot be on hospice as long as she is on treatment. She does have a DNR signed already.  Her trastuzumab  was scheduled on 04/26/2023 and this will be stopped indefinitely. She has a WBC of 4.5, low hemoglobin of 11.9 improved from 11.6, and platelet count of 176,000. Her CMP is fairly normal other than a low sodium of 134, elevated calcium of 11.0, and alkaline phosphatase of 228. I will see her back in 3 weeks with CBC and CMP for supportive care.   She denies signs of infection such as sore throat, sinus drainage, cough, or  urinary symptoms.  She denies fevers or recurrent chills. She denies nausea, vomiting, chest pain, dyspnea or cough. Her appetite is poor. This patient is accompanied in the office by her daughter.   REVIEW OF SYSTEMS:  Review of Systems  Constitutional:  Positive for appetite change and fatigue. Negative for chills, diaphoresis, fever and unexpected weight change.  HENT:   Positive for hearing loss. Negative for lump/mass, mouth sores, nosebleeds, sore throat, tinnitus, trouble swallowing and voice change.   Eyes: Negative.  Negative for eye problems and icterus.  Respiratory: Negative.  Negative for chest tightness, cough, hemoptysis, shortness of breath and wheezing.   Cardiovascular: Negative.  Negative for chest pain, leg swelling and palpitations.  Gastrointestinal: Negative.  Negative for abdominal distention, abdominal pain, blood in stool, constipation, diarrhea, nausea, rectal pain and vomiting.  Endocrine: Negative.   Genitourinary: Negative.  Negative for bladder incontinence, difficulty urinating, dyspareunia, dysuria, frequency, hematuria, menstrual problem, nocturia, pelvic pain, vaginal bleeding and vaginal discharge.   Musculoskeletal:  Positive for gait problem (difficulty ambulating, uses cane). Negative for arthralgias, back pain, flank pain, myalgias, neck pain and neck stiffness.       Bilateral shoulder pain   Skin: Negative.  Negative for itching, rash and wound.  Neurological:  Positive for extremity weakness (legs weak) and gait problem (difficulty ambulating, uses cane). Negative for dizziness, headaches, light-headedness, numbness, seizures and speech difficulty.  Hematological: Negative.  Negative for adenopathy. Does not bruise/bleed easily.  Psychiatric/Behavioral:  Positive for confusion (increased). Negative for decreased concentration, depression, sleep disturbance and suicidal ideas. The patient is not nervous/anxious.     VITALS:  There were no vitals taken for this visit.  Wt Readings from Last 3 Encounters:  03/14/23 128 lb 11.2 oz (58.4 kg)  02/21/23 133 lb 3.2 oz (60.4 kg)  01/31/23 138 lb (62.6 kg)    There is no height or weight on file to calculate BMI.  Performance status (ECOG): 3 - Symptomatic, >50% confined to bed  PHYSICAL EXAM:  Physical Exam Vitals and nursing note reviewed. Exam conducted with a chaperone present.  Constitutional:      General: She is not in acute distress.    Appearance: Normal appearance. She is normal weight. She is ill-appearing (chronically ill-appearing). She is not diaphoretic.  HENT:     Head: Normocephalic and atraumatic.     Right Ear: Tympanic membrane, ear canal and external ear normal. There is no impacted cerumen.     Left Ear: Tympanic membrane, ear canal and external ear normal. There is no impacted cerumen.     Nose: Nose normal. No congestion or rhinorrhea.     Mouth/Throat:     Mouth: Mucous membranes are moist.     Pharynx: Oropharynx is clear. No oropharyngeal exudate or posterior oropharyngeal erythema.  Eyes:     General: No scleral icterus.       Right eye: No discharge.        Left eye: No discharge.     Extraocular Movements: Extraocular movements intact.     Conjunctiva/sclera: Conjunctivae normal.  Neck:     Vascular: No carotid bruit.  Cardiovascular:     Rate and Rhythm: Normal rate and regular rhythm.      Pulses: Normal pulses.     Heart sounds: Normal heart sounds. No murmur heard.    No friction rub. No gallop.  Pulmonary:     Effort: Pulmonary effort is normal. No respiratory distress.     Breath sounds: Normal breath sounds.  No stridor. No wheezing, rhonchi or rales.  Chest:     Chest wall: No tenderness.  Abdominal:     General: Bowel sounds are normal. There is no distension.     Palpations: Abdomen is soft. There is no mass.     Tenderness: There is no abdominal tenderness. There is no right CVA tenderness, left CVA tenderness, guarding or rebound.     Hernia: No hernia is present.  Musculoskeletal:        General: No swelling, tenderness, deformity or signs of injury. Normal range of motion.     Cervical back: Normal range of motion and neck supple. No rigidity or tenderness.     Right lower leg: No edema.     Left lower leg: No edema.  Lymphadenopathy:     Cervical: No cervical adenopathy.  Skin:    General: Skin is warm and dry.     Coloration: Skin is not jaundiced or pale.     Findings: No bruising, erythema, lesion or rash.  Neurological:     Mental Status: She is alert. Mental status is at baseline. She is disoriented.     Cranial Nerves: No cranial nerve deficit.     Sensory: No sensory deficit.     Motor: Weakness (bilateral lower extremities) present.     Coordination: Coordination normal.     Gait: Gait abnormal (can't stand to weigh).     Deep Tendon Reflexes: Reflexes normal.  Psychiatric:        Mood and Affect: Affect is flat.        Speech: Speech is delayed.        Behavior: Behavior is slowed.        Cognition and Memory: Cognition is impaired. Memory is impaired.     LABS:      Latest Ref Rng & Units 05/17/2023   12:53 PM 04/26/2023   12:53 PM 04/05/2023   12:51 PM  CBC  WBC 4.0 - 10.5 K/uL 4.5  3.8  5.0   Hemoglobin 12.0 - 15.0 g/dL 16.1  09.6  04.5   Hematocrit 36.0 - 46.0 % 37.6  34.7  33.8   Platelets 150 - 400 K/uL 176  167  205        Latest Ref Rng & Units 05/17/2023   12:53 PM 04/26/2023   12:53 PM 04/05/2023   12:51 PM  CMP  Glucose 70 - 99 mg/dL 409  811  914   BUN 8 - 23 mg/dL 13  12  16    Creatinine 0.44 - 1.00 mg/dL 7.82  9.56  2.13   Sodium 135 - 145 mmol/L 134  138  137   Potassium 3.5 - 5.1 mmol/L 3.7  3.5  3.8   Chloride 98 - 111 mmol/L 98  100  101   CO2 22 - 32 mmol/L 27  28  26    Calcium 8.9 - 10.3 mg/dL 08.6  57.8  46.9   Total Protein 6.5 - 8.1 g/dL 6.8  6.1  6.5   Total Bilirubin 0.0 - 1.2 mg/dL 1.5  1.1  0.9   Alkaline Phos 38 - 126 U/L 228  201  201   AST 15 - 41 U/L 25  20  16    ALT 0 - 44 U/L 7  6  <5    Lab Results  Component Value Date   CEA 10.17 (H) 01/31/2023   /  CEA ([facility])  Date Value Ref Range Status  01/31/2023 10.17 (H) 0.00 -  5.00 ng/mL Final    Comment:    (NOTE) This test was performed using Beck[employer] Coulter's paramagnetic chemiluminescent immunoassay. Values obtained from different assay methods cannot be used interchangeably. Please note that up to 8% of patients who smoke may see values 5.1-10.0 ng/ml and 1% of patients who smoke may see CEA levels >10.0 ng/ml. Performed at [facility], [address] [place], [name] [address]     Lab Results  Component Value Date   TOTALPROTELP 6.5 12/01/2022   ALBUMINELP 3.9 12/01/2022   A1GS 0.3 12/01/2022   A2GS 0.7 12/01/2022   BETS 0.9 12/01/2022   GAMS 0.8 12/01/2022   MSPIKE Not Observed 12/01/2022   SPEI Comment 12/01/2022   Lab Results  Component Value Date   TIBC 298 10/27/2022   FERRITIN 96 10/27/2022   IRONPCTSAT 24 10/27/2022   Component Ref Range & Units (hover) 2 mo ago (03/14/23) 4 mo ago (01/31/23) 4 mo ago (01/13/23) 6 mo ago (12/01/22)  CA 27.29 89.0 High  130.8 High  CM 117.2 High  CM 111.7 High     STUDIES:  EXAM: 05/04/2023 MRI HEAD WITHOUT AND WITH CONTRAST IMPRESSION: 1. No metastatic disease to the brain or acute intracranial abnormality. Advanced chronic small vessel  disease is stable since last year. 2. Known skeletal metastases, with at least two small enhancing calvarial metastases which were not apparent in October.   EXAM: 04/28/2023 CHEST - 2 VIEW IMPRESSION: Negative for acute cardiopulmonary disease. Foci of metastatic disease involving the right humerus and the left upper posterior ribs/sub pleural tissues, demonstrated on prior PET-C   EXAM: 04/26/2023 CT HEAD WITHOUT CONTRAST IMPRESSION: 1. No acute intracranial process. 2. Atrophy and chronic small vessel ischemic changes. 3. Small old infarcts in the bilateral cerebellum and left frontal lobe. 4. Note is made that MRI with and without contrast is more sensitive to detect small metastatic lesions.  EXAM: 04/05/2023 DG HIP (WITH OR WITHOUT PELVIS) 2-3V LEFT IMPRESSION: 1. No acute radiographic findings or explanation for hip pain. 2. Metastatic lesions on prior PET have no definite radiographic correlate. Consider MRI for further assessment given history of osseous metastatic lesions on prior PET and bony under mineralization. 3. Degenerative change of the pubic symphysis and sacroiliac joints.  HISTORY:   Past Medical History:  Diagnosis Date   Anemia 10/26/2022   Arthritis    osteoarthritis   Breast cancer (HCC)    Diabetes mellitus without complication (HCC)    not on medication   History of CVA (cerebrovascular accident)    Hypertension    Multiple thyroid  nodules    benign   Right arm weakness 10/26/2022   Upper back pain 10/26/2022   Word finding difficulty 10/26/2022    Past Surgical History:  Procedure Laterality Date   BIOPSY THYROID      benign   BREAST BIOPSY     BREAST LUMPECTOMY W/ NEEDLE LOCALIZATION Right 05/04/2020    Family History  Problem Relation Age of Onset   Colon cancer Father        62s   Breast cancer Sister        42-60   Colon cancer Brother        9s   Prostate cancer Brother        late 52s   Breast cancer Half-Sister         late 53s early 43s    Social History:  reports that she has never smoked. She has never used smokeless tobacco. She reports  that she does not currently use alcohol. She reports that she does not use drugs.The patient is accompanied by her daughter today.  Allergies: No Known Allergies  Current Medications: Current Outpatient Medications  Medication Sig Dispense Refill   acetaminophen  (TYLENOL ) 500 MG tablet Take 500 mg by mouth at bedtime.     bisacodyl  (DULCOLAX) 10 MG suppository Place 1 suppository (10 mg total) rectally as needed for moderate constipation. 12 suppository 0   docusate sodium (COLACE) 100 MG capsule Take 100 mg by mouth 2 (two) times daily.     fentaNYL  (DURAGESIC ) 25 MCG/HR Place 1 patch onto the skin every 3 (three) days. 5 patch 0   gabapentin (NEURONTIN) 100 MG capsule Take 100 mg by mouth at bedtime as needed.     ondansetron  (ZOFRAN -ODT) 4 MG disintegrating tablet Take 1 tablet (4 mg total) by mouth every 6 (six) hours as needed for nausea or vomiting. (Patient not taking: Reported on 05/17/2023) 60 tablet 3   traMADol  (ULTRAM ) 50 MG tablet Take 1 tablet (50 mg total) by mouth every 6 (six) hours as needed. 100 tablet 0   No current facility-administered medications for this visit.    I,Jasmine M Lassiter,acting as a scribe for Nolia Baumgartner, MD.,have documented all relevant documentation on the behalf of Nolia Baumgartner, MD,as directed by  Nolia Baumgartner, MD while in the presence of Nolia Baumgartner, MD.

## 2023-06-12 ENCOUNTER — Other Ambulatory Visit: Payer: Self-pay

## 2023-06-12 DIAGNOSIS — M545 Low back pain, unspecified: Secondary | ICD-10-CM

## 2023-06-12 DIAGNOSIS — M549 Dorsalgia, unspecified: Secondary | ICD-10-CM

## 2023-06-12 DIAGNOSIS — C7951 Secondary malignant neoplasm of bone: Secondary | ICD-10-CM

## 2023-06-12 DIAGNOSIS — C50411 Malignant neoplasm of upper-outer quadrant of right female breast: Secondary | ICD-10-CM

## 2023-06-12 DIAGNOSIS — Z170 Estrogen receptor positive status [ER+]: Secondary | ICD-10-CM

## 2023-06-12 MED ORDER — TRAMADOL HCL 50 MG PO TABS
50.0000 mg | ORAL_TABLET | Freq: Four times a day (QID) | ORAL | 0 refills | Status: AC | PRN
Start: 2023-06-12 — End: ?

## 2023-06-12 MED ORDER — FENTANYL 25 MCG/HR TD PT72: 1.0000 | MEDICATED_PATCH | TRANSDERMAL | 0 refills | Status: DC

## 2023-06-12 MED ORDER — GABAPENTIN 100 MG PO CAPS: 100.0000 mg | ORAL_CAPSULE | Freq: Every evening | ORAL | 0 refills | Status: DC | PRN

## 2023-07-25 DEATH — deceased
